# Patient Record
Sex: Male | Born: 1935 | Race: White | Hispanic: No | Marital: Married | Smoking: Former smoker
Health system: Southern US, Community
[De-identification: ages and names within clinical notes are randomized; demographics above are authoritative.]

## PROBLEM LIST (undated history)

## (undated) DIAGNOSIS — R319 Hematuria, unspecified: Secondary | ICD-10-CM

## (undated) DIAGNOSIS — I714 Abdominal aortic aneurysm, without rupture, unspecified: Secondary | ICD-10-CM

## (undated) DIAGNOSIS — E7800 Pure hypercholesterolemia, unspecified: Secondary | ICD-10-CM

## (undated) DIAGNOSIS — C259 Malignant neoplasm of pancreas, unspecified: Principal | ICD-10-CM

## (undated) DIAGNOSIS — R918 Other nonspecific abnormal finding of lung field: Secondary | ICD-10-CM

## (undated) DIAGNOSIS — Z95828 Presence of other vascular implants and grafts: Secondary | ICD-10-CM

## (undated) DIAGNOSIS — L0390 Cellulitis, unspecified: Secondary | ICD-10-CM

## (undated) DIAGNOSIS — N471 Phimosis: Secondary | ICD-10-CM

## (undated) DIAGNOSIS — R350 Frequency of micturition: Secondary | ICD-10-CM

## (undated) DIAGNOSIS — E119 Type 2 diabetes mellitus without complications: Secondary | ICD-10-CM

## (undated) DIAGNOSIS — D696 Thrombocytopenia, unspecified: Secondary | ICD-10-CM

## (undated) DIAGNOSIS — Z8551 Personal history of malignant neoplasm of bladder: Secondary | ICD-10-CM

## (undated) HISTORY — PX: COCHLEAR IMPLANT: SUR684

## (undated) HISTORY — PX: APPENDECTOMY: SHX54

## (undated) HISTORY — DX: Type 2 diabetes mellitus without complications: E11.9

## (undated) HISTORY — DX: Abdominal aortic aneurysm, without rupture: I71.4

## (undated) HISTORY — DX: Cellulitis, unspecified: L03.90

## (undated) HISTORY — DX: Personal history of malignant neoplasm of bladder: Z85.51

## (undated) HISTORY — PX: CHOLECYSTECTOMY: SHX55

## (undated) HISTORY — DX: Hematuria, unspecified: R31.9

## (undated) HISTORY — DX: Other nonspecific abnormal finding of lung field: R91.8

## (undated) HISTORY — DX: Thrombocytopenia, unspecified: D69.6

## (undated) HISTORY — DX: Frequency of micturition: R35.0

## (undated) HISTORY — DX: Presence of other vascular implants and grafts: Z95.828

## (undated) HISTORY — DX: Abdominal aortic aneurysm, without rupture, unspecified: I71.40

## (undated) HISTORY — DX: Phimosis: N47.1

## (undated) HISTORY — DX: Pure hypercholesterolemia, unspecified: E78.00

## (undated) HISTORY — DX: Malignant neoplasm of pancreas, unspecified: C25.9

## (undated) HISTORY — PX: ABDOMINAL AORTIC ANEURYSM REPAIR: SUR1152

---

## 2004-09-17 ENCOUNTER — Ambulatory Visit: Payer: Self-pay | Admitting: Internal Medicine

## 2004-10-08 ENCOUNTER — Ambulatory Visit: Payer: Self-pay | Admitting: Internal Medicine

## 2005-01-27 ENCOUNTER — Ambulatory Visit: Payer: Self-pay | Admitting: Internal Medicine

## 2005-02-05 ENCOUNTER — Ambulatory Visit: Payer: Self-pay | Admitting: Internal Medicine

## 2005-06-11 ENCOUNTER — Ambulatory Visit: Payer: Self-pay | Admitting: Internal Medicine

## 2005-07-08 ENCOUNTER — Ambulatory Visit: Payer: Self-pay | Admitting: Internal Medicine

## 2005-10-23 ENCOUNTER — Ambulatory Visit: Payer: Self-pay | Admitting: Internal Medicine

## 2005-11-07 ENCOUNTER — Ambulatory Visit: Payer: Self-pay | Admitting: Internal Medicine

## 2006-02-17 ENCOUNTER — Ambulatory Visit: Payer: Self-pay | Admitting: Internal Medicine

## 2006-03-08 ENCOUNTER — Ambulatory Visit: Payer: Self-pay | Admitting: Internal Medicine

## 2006-03-13 ENCOUNTER — Emergency Department: Payer: Self-pay | Admitting: Emergency Medicine

## 2006-07-01 ENCOUNTER — Ambulatory Visit: Payer: Self-pay | Admitting: Internal Medicine

## 2006-07-08 ENCOUNTER — Ambulatory Visit: Payer: Self-pay | Admitting: Internal Medicine

## 2006-11-18 ENCOUNTER — Ambulatory Visit: Payer: Self-pay | Admitting: Internal Medicine

## 2006-12-08 ENCOUNTER — Ambulatory Visit: Payer: Self-pay | Admitting: Internal Medicine

## 2007-03-24 ENCOUNTER — Ambulatory Visit: Payer: Self-pay | Admitting: Internal Medicine

## 2007-04-13 ENCOUNTER — Ambulatory Visit: Payer: Self-pay | Admitting: Oncology

## 2007-05-09 ENCOUNTER — Ambulatory Visit: Payer: Self-pay | Admitting: Oncology

## 2007-08-25 ENCOUNTER — Ambulatory Visit: Payer: Self-pay | Admitting: Internal Medicine

## 2007-09-08 ENCOUNTER — Ambulatory Visit: Payer: Self-pay | Admitting: Internal Medicine

## 2007-10-09 ENCOUNTER — Ambulatory Visit: Payer: Self-pay | Admitting: Internal Medicine

## 2007-12-09 ENCOUNTER — Ambulatory Visit: Payer: Self-pay | Admitting: Internal Medicine

## 2008-01-09 ENCOUNTER — Ambulatory Visit: Payer: Self-pay | Admitting: Internal Medicine

## 2008-01-11 ENCOUNTER — Ambulatory Visit: Payer: Self-pay | Admitting: Internal Medicine

## 2008-02-06 ENCOUNTER — Ambulatory Visit: Payer: Self-pay | Admitting: Internal Medicine

## 2008-05-08 ENCOUNTER — Ambulatory Visit: Payer: Self-pay | Admitting: Internal Medicine

## 2008-05-24 ENCOUNTER — Ambulatory Visit: Payer: Self-pay | Admitting: Internal Medicine

## 2008-06-07 ENCOUNTER — Ambulatory Visit: Payer: Self-pay | Admitting: Internal Medicine

## 2008-07-08 ENCOUNTER — Ambulatory Visit: Payer: Self-pay | Admitting: Internal Medicine

## 2008-09-07 ENCOUNTER — Ambulatory Visit: Payer: Self-pay | Admitting: Internal Medicine

## 2008-09-20 ENCOUNTER — Ambulatory Visit: Payer: Self-pay | Admitting: Internal Medicine

## 2008-10-08 ENCOUNTER — Ambulatory Visit: Payer: Self-pay | Admitting: Internal Medicine

## 2008-11-02 ENCOUNTER — Ambulatory Visit: Payer: Self-pay | Admitting: Family Medicine

## 2008-11-04 ENCOUNTER — Ambulatory Visit: Payer: Self-pay | Admitting: Internal Medicine

## 2008-11-11 ENCOUNTER — Ambulatory Visit: Payer: Self-pay | Admitting: Family Medicine

## 2009-03-08 ENCOUNTER — Ambulatory Visit: Payer: Self-pay | Admitting: Internal Medicine

## 2009-03-11 ENCOUNTER — Ambulatory Visit: Payer: Self-pay | Admitting: Internal Medicine

## 2009-03-14 ENCOUNTER — Ambulatory Visit: Payer: Self-pay | Admitting: Internal Medicine

## 2009-04-07 ENCOUNTER — Ambulatory Visit: Payer: Self-pay | Admitting: Internal Medicine

## 2009-08-08 ENCOUNTER — Ambulatory Visit: Payer: Self-pay | Admitting: Internal Medicine

## 2009-08-16 ENCOUNTER — Inpatient Hospital Stay: Payer: Self-pay | Admitting: Specialist

## 2009-08-20 ENCOUNTER — Ambulatory Visit: Payer: Self-pay | Admitting: Internal Medicine

## 2009-09-05 ENCOUNTER — Inpatient Hospital Stay: Payer: Self-pay | Admitting: Vascular Surgery

## 2009-09-07 ENCOUNTER — Ambulatory Visit: Payer: Self-pay | Admitting: Internal Medicine

## 2009-10-08 ENCOUNTER — Ambulatory Visit: Payer: Self-pay | Admitting: Internal Medicine

## 2009-11-07 ENCOUNTER — Ambulatory Visit: Payer: Self-pay | Admitting: Internal Medicine

## 2009-11-12 ENCOUNTER — Ambulatory Visit: Payer: Self-pay | Admitting: Vascular Surgery

## 2009-12-03 ENCOUNTER — Ambulatory Visit: Payer: Self-pay | Admitting: Vascular Surgery

## 2009-12-08 ENCOUNTER — Ambulatory Visit: Payer: Self-pay | Admitting: Internal Medicine

## 2009-12-10 ENCOUNTER — Inpatient Hospital Stay: Payer: Self-pay | Admitting: Vascular Surgery

## 2010-01-08 ENCOUNTER — Ambulatory Visit: Payer: Self-pay | Admitting: Internal Medicine

## 2010-02-05 ENCOUNTER — Ambulatory Visit: Payer: Self-pay | Admitting: Internal Medicine

## 2010-04-10 ENCOUNTER — Ambulatory Visit: Payer: Self-pay | Admitting: Internal Medicine

## 2010-05-08 ENCOUNTER — Ambulatory Visit: Payer: Self-pay | Admitting: Internal Medicine

## 2010-06-07 ENCOUNTER — Ambulatory Visit: Payer: Self-pay | Admitting: Internal Medicine

## 2010-07-06 ENCOUNTER — Ambulatory Visit: Payer: Self-pay | Admitting: Internal Medicine

## 2010-07-08 ENCOUNTER — Ambulatory Visit: Payer: Self-pay | Admitting: Internal Medicine

## 2010-07-13 ENCOUNTER — Ambulatory Visit: Payer: Self-pay | Admitting: Internal Medicine

## 2010-07-15 ENCOUNTER — Ambulatory Visit: Payer: Self-pay | Admitting: Internal Medicine

## 2010-07-16 ENCOUNTER — Ambulatory Visit: Payer: Self-pay | Admitting: Internal Medicine

## 2010-08-08 ENCOUNTER — Ambulatory Visit: Payer: Self-pay | Admitting: Internal Medicine

## 2010-09-07 ENCOUNTER — Ambulatory Visit: Payer: Self-pay | Admitting: Internal Medicine

## 2010-09-26 LAB — CEA: CEA: 5 ng/mL — ABNORMAL HIGH (ref 0.0–4.7)

## 2010-10-08 ENCOUNTER — Ambulatory Visit: Payer: Self-pay | Admitting: Internal Medicine

## 2010-11-12 ENCOUNTER — Ambulatory Visit: Payer: Self-pay | Admitting: Internal Medicine

## 2010-11-28 LAB — CEA: CEA: 4.9 ng/mL — ABNORMAL HIGH (ref 0.0–4.7)

## 2010-12-08 ENCOUNTER — Ambulatory Visit: Payer: Self-pay | Admitting: Internal Medicine

## 2010-12-21 ENCOUNTER — Ambulatory Visit: Payer: Self-pay | Admitting: Family Medicine

## 2011-01-08 ENCOUNTER — Ambulatory Visit: Payer: Self-pay | Admitting: Internal Medicine

## 2011-01-22 LAB — CEA: CEA: 4.5 ng/mL (ref 0.0–4.7)

## 2011-02-06 ENCOUNTER — Ambulatory Visit: Payer: Self-pay | Admitting: Internal Medicine

## 2011-02-19 LAB — CEA: CEA: 4.8 ng/mL — ABNORMAL HIGH (ref 0.0–4.7)

## 2011-03-09 ENCOUNTER — Ambulatory Visit: Payer: Self-pay | Admitting: Internal Medicine

## 2011-03-19 LAB — CEA: CEA: 5.9 ng/mL — ABNORMAL HIGH (ref 0.0–4.7)

## 2011-04-08 ENCOUNTER — Ambulatory Visit: Payer: Self-pay

## 2011-04-08 ENCOUNTER — Ambulatory Visit: Payer: Self-pay | Admitting: Internal Medicine

## 2011-05-09 ENCOUNTER — Ambulatory Visit: Payer: Self-pay | Admitting: Internal Medicine

## 2011-07-29 ENCOUNTER — Ambulatory Visit: Payer: Self-pay | Admitting: Internal Medicine

## 2011-08-09 ENCOUNTER — Ambulatory Visit: Payer: Self-pay | Admitting: Internal Medicine

## 2011-09-29 ENCOUNTER — Ambulatory Visit: Payer: Self-pay | Admitting: Internal Medicine

## 2011-10-09 ENCOUNTER — Ambulatory Visit: Payer: Self-pay | Admitting: Internal Medicine

## 2011-10-22 LAB — CEA: CEA: 6.4 ng/mL — ABNORMAL HIGH (ref 0.0–4.7)

## 2011-11-08 ENCOUNTER — Ambulatory Visit: Payer: Self-pay | Admitting: Internal Medicine

## 2011-12-16 ENCOUNTER — Ambulatory Visit: Payer: Self-pay | Admitting: Internal Medicine

## 2011-12-16 LAB — CREATININE, SERUM
Creatinine: 1.23 mg/dL (ref 0.60–1.30)
EGFR (African American): >60
EGFR (Non-African Amer.): >60

## 2011-12-16 LAB — CANCER CTR PLATELET CT: Platelet: 109 x10 3/mm — ABNORMAL LOW (ref 150–440)

## 2011-12-22 ENCOUNTER — Ambulatory Visit: Payer: Self-pay | Admitting: General Practice

## 2011-12-22 LAB — URINALYSIS, COMPLETE
Bacteria: NONE SEEN
Glucose,UR: NEGATIVE mg/dL (ref 0–75)
Leukocyte Esterase: NEGATIVE
Nitrite: NEGATIVE
Ph: 5 (ref 4.5–8.0)
RBC,UR: 4 /HPF (ref 0–5)
Specific Gravity: 1.014 (ref 1.003–1.030)
WBC UR: <1 /HPF (ref 0–5)

## 2011-12-22 LAB — BASIC METABOLIC PANEL
Anion Gap: 8 (ref 7–16)
BUN: 15 mg/dL (ref 7–18)
Chloride: 105 mmol/L (ref 98–107)
Co2: 30 mmol/L (ref 21–32)
Glucose: 143 mg/dL — ABNORMAL HIGH (ref 65–99)
Osmolality: 288 (ref 275–301)

## 2011-12-22 LAB — CBC
HCT: 46.7 % (ref 40.0–52.0)
MCH: 28.6 pg (ref 26.0–34.0)
MCHC: 32.7 g/dL (ref 32.0–36.0)
Platelet: 107 10*3/uL — ABNORMAL LOW (ref 150–440)
RDW: 14.1 % (ref 11.5–14.5)
WBC: 13.7 10*3/uL — ABNORMAL HIGH (ref 3.8–10.6)

## 2011-12-22 LAB — SEDIMENTATION RATE: Erythrocyte Sed Rate: 1 mm/hr (ref 0–20)

## 2012-01-09 ENCOUNTER — Ambulatory Visit: Payer: Self-pay | Admitting: Internal Medicine

## 2012-01-13 LAB — CBC CANCER CENTER
Basophil #: 0.1 x10 3/mm (ref 0.0–0.1)
Basophil %: 0.7 %
Eosinophil #: 0.1 x10 3/mm (ref 0.0–0.7)
Eosinophil %: 1 %
HGB: 15.3 g/dL (ref 13.0–18.0)
Lymphocyte #: 9.4 x10 3/mm — ABNORMAL HIGH (ref 1.0–3.6)
MCH: 28.7 pg (ref 26.0–34.0)
MCHC: 32.7 g/dL (ref 32.0–36.0)
MCV: 87.8 fL (ref 80–100)
Monocyte #: 0.7 x10 3/mm (ref 0.0–0.7)
Neutrophil #: 4.6 x10 3/mm (ref 1.4–6.5)
Neutrophil %: 30.7 %
RBC: 5.35 10*6/uL (ref 4.40–5.90)
RDW: 13.1 % (ref 11.5–14.5)

## 2012-01-14 LAB — CEA: CEA: 5 ng/mL — ABNORMAL HIGH (ref 0.0–4.7)

## 2012-01-20 LAB — CANCER CTR PLATELET CT: Platelet: 140 x10 3/mm — ABNORMAL LOW (ref 150–440)

## 2012-02-06 ENCOUNTER — Ambulatory Visit: Payer: Self-pay | Admitting: Internal Medicine

## 2012-02-17 LAB — CBC CANCER CENTER
Basophil #: 0.1 x10 3/mm (ref 0.0–0.1)
Basophil %: 0.5 %
Eosinophil %: 0.7 %
HCT: 45.5 % (ref 40.0–52.0)
HGB: 14.9 g/dL (ref 13.0–18.0)
Lymphocyte #: 8.5 x10 3/mm — ABNORMAL HIGH (ref 1.0–3.6)
Lymphocyte %: 67.2 %
MCV: 87.1 fL (ref 80–100)
Monocyte %: 4.6 %
Neutrophil #: 3.4 x10 3/mm (ref 1.4–6.5)
Neutrophil %: 27 %
RBC: 5.23 10*6/uL (ref 4.40–5.90)
WBC: 12.7 x10 3/mm — ABNORMAL HIGH (ref 3.8–10.6)

## 2012-03-08 ENCOUNTER — Ambulatory Visit: Payer: Self-pay | Admitting: Internal Medicine

## 2012-03-29 LAB — CREATININE, SERUM: EGFR (Non-African Amer.): >60

## 2012-04-07 ENCOUNTER — Ambulatory Visit: Payer: Self-pay | Admitting: Internal Medicine

## 2012-06-08 ENCOUNTER — Ambulatory Visit: Payer: Self-pay | Admitting: Oncology

## 2012-06-08 LAB — CBC CANCER CENTER
Eosinophil #: 0.1 x10 3/mm (ref 0.0–0.7)
Eosinophil %: 0.7 %
HCT: 45.5 % (ref 40.0–52.0)
HGB: 15.1 g/dL (ref 13.0–18.0)
Lymphocyte %: 66 %
MCHC: 33.2 g/dL (ref 32.0–36.0)
Monocyte #: 0.5 x10 3/mm (ref 0.2–1.0)
Monocyte %: 3.5 %
Neutrophil #: 4.3 x10 3/mm (ref 1.4–6.5)
Neutrophil %: 29.5 %
Platelet: 108 x10 3/mm — ABNORMAL LOW (ref 150–440)
RBC: 5.23 10*6/uL (ref 4.40–5.90)
WBC: 14.7 x10 3/mm — ABNORMAL HIGH (ref 3.8–10.6)

## 2012-06-08 LAB — CREATININE, SERUM
EGFR (African American): >60
EGFR (Non-African Amer.): >60

## 2012-07-08 ENCOUNTER — Ambulatory Visit: Payer: Self-pay | Admitting: Oncology

## 2012-07-13 LAB — CANCER CTR PLATELET CT: Platelet: 91 x10 3/mm — ABNORMAL LOW (ref 150–440)

## 2012-08-08 ENCOUNTER — Ambulatory Visit: Payer: Self-pay | Admitting: Oncology

## 2012-08-13 ENCOUNTER — Ambulatory Visit: Payer: Self-pay | Admitting: Oncology

## 2012-08-13 LAB — CREATININE, SERUM
Creatinine: 1.3 mg/dL (ref 0.60–1.30)
EGFR (African American): >60
EGFR (Non-African Amer.): 53 — ABNORMAL LOW

## 2012-08-13 LAB — CANCER CTR PLATELET CT: Platelet: 101 x10 3/mm — ABNORMAL LOW (ref 150–440)

## 2012-09-07 ENCOUNTER — Ambulatory Visit: Payer: Self-pay | Admitting: Oncology

## 2012-09-13 LAB — CBC CANCER CENTER
Basophil #: 0.1 x10 3/mm (ref 0.0–0.1)
Basophil %: 0.8 %
Eosinophil #: 0.1 x10 3/mm (ref 0.0–0.7)
HCT: 45.8 % (ref 40.0–52.0)
Lymphocyte #: 9.4 x10 3/mm — ABNORMAL HIGH (ref 1.0–3.6)
Lymphocyte %: 64.8 %
MCHC: 33.3 g/dL (ref 32.0–36.0)
MCV: 89 fL (ref 80–100)
Neutrophil #: 4.4 x10 3/mm (ref 1.4–6.5)
Platelet: 100 x10 3/mm — ABNORMAL LOW (ref 150–440)
RDW: 13.7 % (ref 11.5–14.5)
WBC: 14.5 x10 3/mm — ABNORMAL HIGH (ref 3.8–10.6)

## 2012-10-08 ENCOUNTER — Ambulatory Visit: Payer: Self-pay | Admitting: Internal Medicine

## 2012-10-08 ENCOUNTER — Ambulatory Visit: Payer: Self-pay | Admitting: Oncology

## 2012-11-02 LAB — CREATININE, SERUM
Creatinine: 1.29 mg/dL (ref 0.60–1.30)
EGFR (African American): >60

## 2012-11-07 ENCOUNTER — Ambulatory Visit: Payer: Self-pay | Admitting: Oncology

## 2012-12-29 ENCOUNTER — Ambulatory Visit: Payer: Self-pay | Admitting: Internal Medicine

## 2012-12-29 LAB — CBC CANCER CENTER
Basophil #: 0.1 x10 3/mm (ref 0.0–0.1)
Eosinophil %: 1 %
HCT: 45.3 % (ref 40.0–52.0)
HGB: 15.2 g/dL (ref 13.0–18.0)
MCHC: 33.7 g/dL (ref 32.0–36.0)
MCV: 86 fL (ref 80–100)
Neutrophil #: 3.9 x10 3/mm (ref 1.4–6.5)
Neutrophil %: 31 %
RBC: 5.27 10*6/uL (ref 4.40–5.90)
RDW: 14.3 % (ref 11.5–14.5)
WBC: 12.7 x10 3/mm — ABNORMAL HIGH (ref 3.8–10.6)

## 2012-12-29 LAB — CREATININE, SERUM
Creatinine: 1.27 mg/dL (ref 0.60–1.30)
EGFR (African American): >60
EGFR (Non-African Amer.): 54 — ABNORMAL LOW

## 2013-01-08 ENCOUNTER — Ambulatory Visit: Payer: Self-pay | Admitting: Internal Medicine

## 2013-03-08 ENCOUNTER — Ambulatory Visit: Payer: Self-pay | Admitting: Internal Medicine

## 2013-03-08 LAB — CBC CANCER CENTER
Basophil %: 0.5 %
HCT: 48.2 % (ref 40.0–52.0)
HGB: 15.9 g/dL (ref 13.0–18.0)
Lymphocyte #: 7.5 x10 3/mm — ABNORMAL HIGH (ref 1.0–3.6)
MCH: 28.4 pg (ref 26.0–34.0)
MCHC: 33 g/dL (ref 32.0–36.0)
MCV: 86 fL (ref 80–100)
Monocyte #: 0.5 x10 3/mm (ref 0.2–1.0)
Neutrophil #: 4.2 x10 3/mm (ref 1.4–6.5)
Neutrophil %: 34.2 %
Platelet: 105 x10 3/mm — ABNORMAL LOW (ref 150–440)
RDW: 13.8 % (ref 11.5–14.5)

## 2013-03-08 LAB — CREATININE, SERUM
Creatinine: 1.38 mg/dL — ABNORMAL HIGH (ref 0.60–1.30)
EGFR (African American): 57 — ABNORMAL LOW

## 2013-04-05 LAB — CANCER CTR PLATELET CT: Platelet: 104 x10 3/mm — ABNORMAL LOW (ref 150–440)

## 2013-04-06 LAB — CEA: CEA: 5.4 ng/mL — ABNORMAL HIGH (ref 0.0–4.7)

## 2013-04-07 ENCOUNTER — Ambulatory Visit: Payer: Self-pay | Admitting: Internal Medicine

## 2013-05-17 ENCOUNTER — Ambulatory Visit: Payer: Self-pay | Admitting: Internal Medicine

## 2013-05-17 LAB — CBC CANCER CENTER
Basophil #: 0.1 x10 3/mm (ref 0.0–0.1)
Basophil %: 0.8 %
Eosinophil %: 0.9 %
HCT: 44.3 % (ref 40.0–52.0)
HGB: 14.8 g/dL (ref 13.0–18.0)
Lymphocyte #: 8.4 x10 3/mm — ABNORMAL HIGH (ref 1.0–3.6)
Lymphocyte %: 66.5 %
MCV: 85 fL (ref 80–100)
Monocyte #: 0.3 x10 3/mm (ref 0.2–1.0)
Monocyte %: 2.4 %
Neutrophil #: 3.7 x10 3/mm (ref 1.4–6.5)
Platelet: 107 x10 3/mm — ABNORMAL LOW (ref 150–440)
RDW: 13.9 % (ref 11.5–14.5)

## 2013-06-07 ENCOUNTER — Ambulatory Visit: Payer: Self-pay | Admitting: Internal Medicine

## 2013-06-28 LAB — PROTIME-INR
INR: 1
Prothrombin Time: 13.7 secs (ref 11.5–14.7)

## 2013-07-08 ENCOUNTER — Ambulatory Visit: Payer: Self-pay | Admitting: Internal Medicine

## 2013-07-19 LAB — CANCER CTR PLATELET CT: Platelet: 124 x10 3/mm — ABNORMAL LOW (ref 150–440)

## 2013-08-08 ENCOUNTER — Ambulatory Visit: Payer: Self-pay | Admitting: Internal Medicine

## 2013-08-15 LAB — PLATELET COUNT: Platelet: 120 10*3/uL — ABNORMAL LOW (ref 150–440)

## 2013-09-07 ENCOUNTER — Ambulatory Visit: Payer: Self-pay | Admitting: Internal Medicine

## 2013-09-19 LAB — PROTIME-INR
INR: 1.4
Prothrombin Time: 17.4 secs — ABNORMAL HIGH (ref 11.5–14.7)

## 2013-09-21 LAB — PROTIME-INR
INR: 1.8
Prothrombin Time: 21 s — ABNORMAL HIGH

## 2013-10-07 LAB — PROTIME-INR
INR: 2
Prothrombin Time: 22.6 secs — ABNORMAL HIGH (ref 11.5–14.7)

## 2013-10-08 ENCOUNTER — Ambulatory Visit: Payer: Self-pay | Admitting: Internal Medicine

## 2013-10-13 LAB — PROTIME-INR
INR: 1.9
Prothrombin Time: 21.6 secs — ABNORMAL HIGH (ref 11.5–14.7)

## 2013-10-21 DIAGNOSIS — Z9889 Other specified postprocedural states: Secondary | ICD-10-CM | POA: Insufficient documentation

## 2013-10-21 DIAGNOSIS — D696 Thrombocytopenia, unspecified: Secondary | ICD-10-CM | POA: Insufficient documentation

## 2013-10-21 DIAGNOSIS — I82409 Acute embolism and thrombosis of unspecified deep veins of unspecified lower extremity: Secondary | ICD-10-CM | POA: Insufficient documentation

## 2013-10-21 DIAGNOSIS — IMO0002 Reserved for concepts with insufficient information to code with codable children: Secondary | ICD-10-CM | POA: Insufficient documentation

## 2013-10-22 ENCOUNTER — Ambulatory Visit: Payer: Self-pay | Admitting: Family Medicine

## 2013-10-22 LAB — URINALYSIS, COMPLETE
Glucose,UR: NEGATIVE mg/dL (ref 0–75)
Leukocyte Esterase: NEGATIVE
Nitrite: NEGATIVE
Ph: 6 (ref 4.5–8.0)
Protein: NEGATIVE
Specific Gravity: 1.025 (ref 1.003–1.030)
WBC UR: NONE SEEN /HPF (ref 0–5)

## 2013-10-22 LAB — DOT URINE DIP
Protein: NEGATIVE
Specific Gravity: 1.025 (ref 1.003–1.030)

## 2013-10-31 LAB — PROTIME-INR
INR: 2.5
Prothrombin Time: 26.1 secs — ABNORMAL HIGH (ref 11.5–14.7)

## 2013-11-07 ENCOUNTER — Ambulatory Visit: Payer: Self-pay | Admitting: Internal Medicine

## 2013-11-15 LAB — PROTIME-INR: INR: 2.2

## 2013-11-22 ENCOUNTER — Ambulatory Visit: Admit: 2013-11-22 | Disposition: A | Discharge status: Home / Self Care | Payer: Self-pay | Admitting: Internal Medicine

## 2013-11-29 ENCOUNTER — Ambulatory Visit: Payer: Self-pay | Admitting: Internal Medicine

## 2013-11-29 LAB — CBC CANCER CENTER
Basophil #: 0 x10 3/mm (ref 0.0–0.1)
Basophil %: 0.5 %
HCT: 45.6 % (ref 40.0–52.0)
Lymphocyte #: 6.2 x10 3/mm — ABNORMAL HIGH (ref 1.0–3.6)
Lymphocyte %: 58.8 %
MCV: 85 fL (ref 80–100)
Monocyte #: 0.6 x10 3/mm (ref 0.2–1.0)
Monocyte %: 5.2 %
Neutrophil #: 3.7 x10 3/mm (ref 1.4–6.5)
Neutrophil %: 34.5 %
Platelet: 84 x10 3/mm — ABNORMAL LOW (ref 150–440)

## 2013-11-29 LAB — PROTIME-INR
INR: 2.5
Prothrombin Time: 26.4 secs — ABNORMAL HIGH (ref 11.5–14.7)

## 2013-12-08 ENCOUNTER — Ambulatory Visit: Payer: Self-pay | Admitting: Internal Medicine

## 2013-12-08 ENCOUNTER — Ambulatory Visit: Payer: Self-pay

## 2013-12-27 LAB — CBC CANCER CENTER
Basophil #: 0.1 x10 3/mm (ref 0.0–0.1)
Basophil %: 0.6 %
EOS ABS: 0.1 x10 3/mm (ref 0.0–0.7)
EOS PCT: 0.6 %
HCT: 47.3 % (ref 40.0–52.0)
HGB: 15.6 g/dL (ref 13.0–18.0)
Lymphocyte #: 6.8 x10 3/mm — ABNORMAL HIGH (ref 1.0–3.6)
Lymphocyte %: 61.1 %
MCH: 28.1 pg (ref 26.0–34.0)
MCHC: 33 g/dL (ref 32.0–36.0)
MCV: 85 fL (ref 80–100)
MONO ABS: 0.4 x10 3/mm (ref 0.2–1.0)
Monocyte %: 3.4 %
NEUTROS PCT: 34.3 %
Neutrophil #: 3.8 x10 3/mm (ref 1.4–6.5)
PLATELETS: 88 x10 3/mm — AB (ref 150–440)
RBC: 5.54 10*6/uL (ref 4.40–5.90)
RDW: 14.5 % (ref 11.5–14.5)
WBC: 11.1 x10 3/mm — ABNORMAL HIGH (ref 3.8–10.6)

## 2013-12-27 LAB — PROTIME-INR
INR: 2.6
Prothrombin Time: 27.4 secs — ABNORMAL HIGH (ref 11.5–14.7)

## 2014-01-08 ENCOUNTER — Ambulatory Visit: Payer: Self-pay | Admitting: Internal Medicine

## 2014-01-31 LAB — CBC CANCER CENTER
Basophil #: 0.1 x10 3/mm (ref 0.0–0.1)
Basophil %: 0.4 %
EOS PCT: 1 %
Eosinophil #: 0.1 x10 3/mm (ref 0.0–0.7)
HCT: 49.3 % (ref 40.0–52.0)
HGB: 16.1 g/dL (ref 13.0–18.0)
Lymphocyte #: 7.2 x10 3/mm — ABNORMAL HIGH (ref 1.0–3.6)
Lymphocyte %: 60 %
MCH: 28 pg (ref 26.0–34.0)
MCHC: 32.7 g/dL (ref 32.0–36.0)
MCV: 86 fL (ref 80–100)
MONO ABS: 0.5 x10 3/mm (ref 0.2–1.0)
Monocyte %: 4.1 %
NEUTROS PCT: 34.5 %
Neutrophil #: 4.1 x10 3/mm (ref 1.4–6.5)
Platelet: 88 x10 3/mm — ABNORMAL LOW (ref 150–440)
RBC: 5.75 10*6/uL (ref 4.40–5.90)
RDW: 14.1 % (ref 11.5–14.5)
WBC: 11.9 x10 3/mm — AB (ref 3.8–10.6)

## 2014-01-31 LAB — PROTIME-INR
INR: 2.4
Prothrombin Time: 25.4 secs — ABNORMAL HIGH (ref 11.5–14.7)

## 2014-02-05 ENCOUNTER — Ambulatory Visit: Payer: Self-pay | Admitting: Internal Medicine

## 2014-03-08 ENCOUNTER — Ambulatory Visit: Payer: Self-pay | Admitting: Internal Medicine

## 2014-03-21 LAB — CBC CANCER CENTER
Basophil #: 0 x10 3/mm (ref 0.0–0.1)
Basophil %: 0.4 %
EOS ABS: 0.1 x10 3/mm (ref 0.0–0.7)
EOS PCT: 0.9 %
HCT: 46.4 % (ref 40.0–52.0)
HGB: 15.4 g/dL (ref 13.0–18.0)
Lymphocyte #: 7.6 x10 3/mm — ABNORMAL HIGH (ref 1.0–3.6)
Lymphocyte %: 58.4 %
MCH: 28 pg (ref 26.0–34.0)
MCHC: 33.2 g/dL (ref 32.0–36.0)
MCV: 84 fL (ref 80–100)
Monocyte #: 0.5 x10 3/mm (ref 0.2–1.0)
Monocyte %: 3.7 %
NEUTROS PCT: 36.6 %
Neutrophil #: 4.7 x10 3/mm (ref 1.4–6.5)
PLATELETS: 98 x10 3/mm — AB (ref 150–440)
RBC: 5.49 10*6/uL (ref 4.40–5.90)
RDW: 14.9 % — AB (ref 11.5–14.5)
WBC: 13 x10 3/mm — AB (ref 3.8–10.6)

## 2014-03-21 LAB — PROTIME-INR
INR: 2.1
PROTHROMBIN TIME: 23.4 s — AB (ref 11.5–14.7)

## 2014-04-07 ENCOUNTER — Ambulatory Visit: Payer: Self-pay | Admitting: Internal Medicine

## 2014-04-25 LAB — CBC CANCER CENTER
BASOS ABS: 0.1 x10 3/mm (ref 0.0–0.1)
Basophil %: 0.4 %
Eosinophil #: 0.1 x10 3/mm (ref 0.0–0.7)
Eosinophil %: 0.9 %
HCT: 48.2 % (ref 40.0–52.0)
HGB: 15.9 g/dL (ref 13.0–18.0)
LYMPHS ABS: 8 x10 3/mm — AB (ref 1.0–3.6)
Lymphocyte %: 61.6 %
MCH: 28.4 pg (ref 26.0–34.0)
MCHC: 32.9 g/dL (ref 32.0–36.0)
MCV: 86 fL (ref 80–100)
Monocyte #: 0.5 x10 3/mm (ref 0.2–1.0)
Monocyte %: 3.6 %
Neutrophil #: 4.3 x10 3/mm (ref 1.4–6.5)
Neutrophil %: 33.5 %
Platelet: 91 x10 3/mm — ABNORMAL LOW (ref 150–440)
RBC: 5.6 10*6/uL (ref 4.40–5.90)
RDW: 14.3 % (ref 11.5–14.5)
WBC: 13 x10 3/mm — AB (ref 3.8–10.6)

## 2014-04-25 LAB — PROTIME-INR
INR: 1.8
PROTHROMBIN TIME: 20.1 s — AB (ref 11.5–14.7)

## 2014-05-03 LAB — CBC CANCER CENTER
Basophil #: 0.1 x10 3/mm (ref 0.0–0.1)
Basophil %: 0.5 %
EOS PCT: 0.7 %
Eosinophil #: 0.1 x10 3/mm (ref 0.0–0.7)
HCT: 48.4 % (ref 40.0–52.0)
HGB: 16.1 g/dL (ref 13.0–18.0)
Lymphocyte #: 8.5 x10 3/mm — ABNORMAL HIGH (ref 1.0–3.6)
Lymphocyte %: 59.1 %
MCH: 28.2 pg (ref 26.0–34.0)
MCHC: 33.2 g/dL (ref 32.0–36.0)
MCV: 85 fL (ref 80–100)
Monocyte #: 0.5 x10 3/mm (ref 0.2–1.0)
Monocyte %: 3.7 %
NEUTROS ABS: 5.2 x10 3/mm (ref 1.4–6.5)
Neutrophil %: 36 %
Platelet: 101 x10 3/mm — ABNORMAL LOW (ref 150–440)
RBC: 5.69 10*6/uL (ref 4.40–5.90)
RDW: 14.2 % (ref 11.5–14.5)
WBC: 14.4 x10 3/mm — ABNORMAL HIGH (ref 3.8–10.6)

## 2014-05-03 LAB — PROTIME-INR
INR: 1.8
PROTHROMBIN TIME: 20.1 s — AB (ref 11.5–14.7)

## 2014-05-04 LAB — URINALYSIS, COMPLETE
BACTERIA: NEGATIVE
Bilirubin,UR: NEGATIVE
KETONE: NEGATIVE
LEUKOCYTE ESTERASE: NEGATIVE
Nitrite: NEGATIVE
Ph: 6.5 (ref 4.5–8.0)
Specific Gravity: 1.01 (ref 1.003–1.030)

## 2014-05-06 LAB — URINE CULTURE

## 2014-05-08 ENCOUNTER — Ambulatory Visit: Payer: Self-pay | Admitting: Internal Medicine

## 2014-05-08 DIAGNOSIS — D494 Neoplasm of unspecified behavior of bladder: Secondary | ICD-10-CM | POA: Insufficient documentation

## 2014-05-14 DIAGNOSIS — H9193 Unspecified hearing loss, bilateral: Secondary | ICD-10-CM | POA: Insufficient documentation

## 2014-05-14 DIAGNOSIS — C9110 Chronic lymphocytic leukemia of B-cell type not having achieved remission: Secondary | ICD-10-CM | POA: Insufficient documentation

## 2014-05-15 ENCOUNTER — Ambulatory Visit: Payer: Self-pay | Admitting: Urology

## 2014-05-17 ENCOUNTER — Ambulatory Visit: Payer: Self-pay | Admitting: Urology

## 2014-05-19 LAB — PATHOLOGY REPORT

## 2014-05-23 LAB — CBC CANCER CENTER
Basophil #: 0.2 x10 3/mm — ABNORMAL HIGH (ref 0.0–0.1)
Basophil %: 1.1 %
Eosinophil #: 0.1 x10 3/mm (ref 0.0–0.7)
Eosinophil %: 0.6 %
HCT: 44.4 % (ref 40.0–52.0)
HGB: 14.5 g/dL (ref 13.0–18.0)
Lymphocyte #: 7.4 x10 3/mm — ABNORMAL HIGH (ref 1.0–3.6)
Lymphocyte %: 53.1 %
MCH: 28.3 pg (ref 26.0–34.0)
MCHC: 32.6 g/dL (ref 32.0–36.0)
MCV: 87 fL (ref 80–100)
Monocyte #: 0.5 x10 3/mm (ref 0.2–1.0)
Monocyte %: 3.6 %
NEUTROS ABS: 5.8 x10 3/mm (ref 1.4–6.5)
Neutrophil %: 41.6 %
PLATELETS: 89 x10 3/mm — AB (ref 150–440)
RBC: 5.12 10*6/uL (ref 4.40–5.90)
RDW: 14.8 % — AB (ref 11.5–14.5)
WBC: 13.9 x10 3/mm — ABNORMAL HIGH (ref 3.8–10.6)

## 2014-05-23 LAB — PROTIME-INR
INR: 1
Prothrombin Time: 13 secs (ref 11.5–14.7)

## 2014-05-23 LAB — CREATININE, SERUM
CREATININE: 1.04 mg/dL (ref 0.60–1.30)
EGFR (African American): >60
EGFR (Non-African Amer.): >60

## 2014-05-29 LAB — CBC CANCER CENTER
Basophil #: 0.1 x10 3/mm (ref 0.0–0.1)
Basophil %: 0.4 %
EOS PCT: 0.6 %
Eosinophil #: 0.1 x10 3/mm (ref 0.0–0.7)
HCT: 43.8 % (ref 40.0–52.0)
HGB: 14.5 g/dL (ref 13.0–18.0)
Lymphocyte #: 6.9 x10 3/mm — ABNORMAL HIGH (ref 1.0–3.6)
Lymphocyte %: 56.6 %
MCH: 28.6 pg (ref 26.0–34.0)
MCHC: 33.2 g/dL (ref 32.0–36.0)
MCV: 86 fL (ref 80–100)
MONOS PCT: 3.1 %
Monocyte #: 0.4 x10 3/mm (ref 0.2–1.0)
Neutrophil #: 4.8 x10 3/mm (ref 1.4–6.5)
Neutrophil %: 39.3 %
PLATELETS: 93 x10 3/mm — AB (ref 150–440)
RBC: 5.08 10*6/uL (ref 4.40–5.90)
RDW: 14.7 % — AB (ref 11.5–14.5)
WBC: 12.3 x10 3/mm — ABNORMAL HIGH (ref 3.8–10.6)

## 2014-05-29 LAB — PROTIME-INR
INR: 1
Prothrombin Time: 13.2 secs (ref 11.5–14.7)

## 2014-06-07 ENCOUNTER — Ambulatory Visit: Payer: Self-pay | Admitting: Internal Medicine

## 2014-06-08 LAB — PROTIME-INR
INR: 1.6
Prothrombin Time: 18.4 secs — ABNORMAL HIGH (ref 11.5–14.7)

## 2014-06-13 LAB — PROTIME-INR
INR: 1.8
Prothrombin Time: 20.3 secs — ABNORMAL HIGH (ref 11.5–14.7)

## 2014-06-19 LAB — CBC CANCER CENTER
BASOS ABS: 0.1 x10 3/mm (ref 0.0–0.1)
Basophil %: 0.4 %
EOS ABS: 0.1 x10 3/mm (ref 0.0–0.7)
EOS PCT: 0.7 %
HCT: 42.2 % (ref 40.0–52.0)
HGB: 13.9 g/dL (ref 13.0–18.0)
LYMPHS ABS: 8.2 x10 3/mm — AB (ref 1.0–3.6)
Lymphocyte %: 54.3 %
MCH: 28.2 pg (ref 26.0–34.0)
MCHC: 33.1 g/dL (ref 32.0–36.0)
MCV: 85 fL (ref 80–100)
MONO ABS: 0.5 x10 3/mm (ref 0.2–1.0)
Monocyte %: 3.2 %
NEUTROS PCT: 41.4 %
Neutrophil #: 6.2 x10 3/mm (ref 1.4–6.5)
PLATELETS: 107 x10 3/mm — AB (ref 150–440)
RBC: 4.94 10*6/uL (ref 4.40–5.90)
RDW: 14.7 % — ABNORMAL HIGH (ref 11.5–14.5)
WBC: 15 x10 3/mm — ABNORMAL HIGH (ref 3.8–10.6)

## 2014-06-19 LAB — PROTIME-INR
INR: 1.8
Prothrombin Time: 20.6 secs — ABNORMAL HIGH (ref 11.5–14.7)

## 2014-06-23 LAB — PROTIME-INR
INR: 1.7
PROTHROMBIN TIME: 19.4 s — AB (ref 11.5–14.7)

## 2014-07-08 ENCOUNTER — Ambulatory Visit: Payer: Self-pay | Admitting: Internal Medicine

## 2014-07-11 ENCOUNTER — Ambulatory Visit: Payer: Self-pay | Admitting: Internal Medicine

## 2014-07-11 LAB — PROTIME-INR
INR: 1.7
Prothrombin Time: 19.5 secs — ABNORMAL HIGH (ref 11.5–14.7)

## 2014-07-18 LAB — PROTIME-INR
INR: 1.8
Prothrombin Time: 20.3 secs — ABNORMAL HIGH (ref 11.5–14.7)

## 2014-07-25 LAB — CBC CANCER CENTER
BASOS ABS: 0.1 x10 3/mm (ref 0.0–0.1)
BASOS PCT: 0.5 %
EOS ABS: 0.2 x10 3/mm (ref 0.0–0.7)
EOS PCT: 1.2 %
HCT: 45.6 % (ref 40.0–52.0)
HGB: 15.1 g/dL (ref 13.0–18.0)
LYMPHS ABS: 8.5 x10 3/mm — AB (ref 1.0–3.6)
Lymphocyte %: 63.6 %
MCH: 28.8 pg (ref 26.0–34.0)
MCHC: 33.1 g/dL (ref 32.0–36.0)
MCV: 87 fL (ref 80–100)
MONO ABS: 0.4 x10 3/mm (ref 0.2–1.0)
Monocyte %: 3.4 %
NEUTROS ABS: 4.2 x10 3/mm (ref 1.4–6.5)
NEUTROS PCT: 31.3 %
Platelet: 97 x10 3/mm — ABNORMAL LOW (ref 150–440)
RBC: 5.25 10*6/uL (ref 4.40–5.90)
RDW: 15.3 % — ABNORMAL HIGH (ref 11.5–14.5)
WBC: 13.4 x10 3/mm — ABNORMAL HIGH (ref 3.8–10.6)

## 2014-07-25 LAB — PROTIME-INR
INR: 1.9
Prothrombin Time: 21.2 secs — ABNORMAL HIGH (ref 11.5–14.7)

## 2014-07-26 LAB — CEA: CEA: 7.1 ng/mL — ABNORMAL HIGH (ref 0.0–4.7)

## 2014-08-07 LAB — PROTIME-INR
INR: 1.8
PROTHROMBIN TIME: 20.9 s — AB (ref 11.5–14.7)

## 2014-08-08 ENCOUNTER — Ambulatory Visit: Payer: Self-pay | Admitting: Internal Medicine

## 2014-08-08 LAB — CEA: CEA: 7 ng/mL — ABNORMAL HIGH (ref 0.0–4.7)

## 2014-08-29 LAB — CBC CANCER CENTER
Basophil #: 0 x10 3/mm (ref 0.0–0.1)
Basophil %: 0.2 %
EOS ABS: 0.2 x10 3/mm (ref 0.0–0.7)
Eosinophil %: 1.4 %
HCT: 45.8 % (ref 40.0–52.0)
HGB: 14.9 g/dL (ref 13.0–18.0)
LYMPHS ABS: 7.3 x10 3/mm — AB (ref 1.0–3.6)
Lymphocyte %: 62 %
MCH: 28.4 pg (ref 26.0–34.0)
MCHC: 32.6 g/dL (ref 32.0–36.0)
MCV: 87 fL (ref 80–100)
Monocyte #: 0.4 x10 3/mm (ref 0.2–1.0)
Monocyte %: 3 %
NEUTROS PCT: 33.4 %
Neutrophil #: 4 x10 3/mm (ref 1.4–6.5)
PLATELETS: 79 x10 3/mm — AB (ref 150–440)
RBC: 5.26 10*6/uL (ref 4.40–5.90)
RDW: 14.7 % — ABNORMAL HIGH (ref 11.5–14.5)
WBC: 11.8 x10 3/mm — AB (ref 3.8–10.6)

## 2014-08-29 LAB — PROTIME-INR
INR: 2
PROTHROMBIN TIME: 22.2 s — AB (ref 11.5–14.7)

## 2014-09-07 ENCOUNTER — Ambulatory Visit: Payer: Self-pay | Admitting: Internal Medicine

## 2014-09-13 LAB — CEA: CEA: 6.2 ng/mL — ABNORMAL HIGH (ref 0.0–4.7)

## 2014-09-26 LAB — CBC CANCER CENTER
BASOS ABS: 0.1 x10 3/mm (ref 0.0–0.1)
Basophil %: 0.5 %
EOS PCT: 0.8 %
Eosinophil #: 0.1 x10 3/mm (ref 0.0–0.7)
HCT: 45.3 % (ref 40.0–52.0)
HGB: 15.3 g/dL (ref 13.0–18.0)
LYMPHS PCT: 60.9 %
Lymphocyte #: 9.3 x10 3/mm — ABNORMAL HIGH (ref 1.0–3.6)
MCH: 29.2 pg (ref 26.0–34.0)
MCHC: 33.9 g/dL (ref 32.0–36.0)
MCV: 86 fL (ref 80–100)
Monocyte #: 0.4 x10 3/mm (ref 0.2–1.0)
Monocyte %: 2.8 %
NEUTROS PCT: 35 %
Neutrophil #: 5.3 x10 3/mm (ref 1.4–6.5)
PLATELETS: 93 x10 3/mm — AB (ref 150–440)
RBC: 5.26 10*6/uL (ref 4.40–5.90)
RDW: 14.3 % (ref 11.5–14.5)
WBC: 15.2 x10 3/mm — ABNORMAL HIGH (ref 3.8–10.6)

## 2014-09-26 LAB — PROTIME-INR
INR: 2
Prothrombin Time: 22.1 secs — ABNORMAL HIGH (ref 11.5–14.7)

## 2014-09-29 ENCOUNTER — Ambulatory Visit: Payer: Self-pay | Admitting: Family Medicine

## 2014-09-29 LAB — DOT URINE DIP
Glucose,UR: NEGATIVE
PROTEIN: NEGATIVE
Specific Gravity: 1.025 (ref 1.000–1.030)

## 2014-10-08 ENCOUNTER — Ambulatory Visit: Payer: Self-pay | Admitting: Internal Medicine

## 2014-10-24 LAB — CBC CANCER CENTER
Basophil #: 0.1 x10 3/mm (ref 0.0–0.1)
Basophil %: 0.6 %
EOS PCT: 0.8 %
Eosinophil #: 0.1 x10 3/mm (ref 0.0–0.7)
HCT: 46.4 % (ref 40.0–52.0)
HGB: 14.9 g/dL (ref 13.0–18.0)
Lymphocyte #: 8.6 x10 3/mm — ABNORMAL HIGH (ref 1.0–3.6)
Lymphocyte %: 62.4 %
MCH: 27.8 pg (ref 26.0–34.0)
MCHC: 32.2 g/dL (ref 32.0–36.0)
MCV: 86 fL (ref 80–100)
MONO ABS: 0.5 x10 3/mm (ref 0.2–1.0)
Monocyte %: 3.3 %
NEUTROS ABS: 4.5 x10 3/mm (ref 1.4–6.5)
NEUTROS PCT: 32.9 %
Platelet: 86 x10 3/mm — ABNORMAL LOW (ref 150–440)
RBC: 5.38 10*6/uL (ref 4.40–5.90)
RDW: 14.1 % (ref 11.5–14.5)
WBC: 13.7 x10 3/mm — AB (ref 3.8–10.6)

## 2014-10-24 LAB — PROTIME-INR
INR: 1.8
Prothrombin Time: 20.5 secs — ABNORMAL HIGH (ref 11.5–14.7)

## 2014-10-25 LAB — CEA: CEA: 6.5 ng/mL — AB (ref 0.0–4.7)

## 2014-11-07 ENCOUNTER — Ambulatory Visit: Payer: Self-pay | Admitting: Internal Medicine

## 2014-11-22 ENCOUNTER — Ambulatory Visit: Payer: Self-pay | Admitting: Ophthalmology

## 2014-12-15 ENCOUNTER — Other Ambulatory Visit (HOSPITAL_COMMUNITY): Payer: Self-pay | Admitting: Internal Medicine

## 2014-12-25 ENCOUNTER — Ambulatory Visit: Payer: Self-pay

## 2014-12-25 DIAGNOSIS — S0992XA Unspecified injury of nose, initial encounter: Secondary | ICD-10-CM | POA: Diagnosis not present

## 2014-12-25 DIAGNOSIS — E1165 Type 2 diabetes mellitus with hyperglycemia: Secondary | ICD-10-CM | POA: Diagnosis not present

## 2014-12-25 DIAGNOSIS — J3489 Other specified disorders of nose and nasal sinuses: Secondary | ICD-10-CM | POA: Diagnosis not present

## 2014-12-25 DIAGNOSIS — H2511 Age-related nuclear cataract, right eye: Secondary | ICD-10-CM | POA: Diagnosis not present

## 2014-12-25 DIAGNOSIS — R739 Hyperglycemia, unspecified: Secondary | ICD-10-CM | POA: Diagnosis not present

## 2014-12-25 DIAGNOSIS — Z79899 Other long term (current) drug therapy: Secondary | ICD-10-CM | POA: Diagnosis not present

## 2014-12-25 LAB — PROTIME-INR
INR: 1.6
PROTHROMBIN TIME: 18.9 s — AB (ref 11.5–14.7)

## 2014-12-28 ENCOUNTER — Ambulatory Visit: Payer: Self-pay | Admitting: Internal Medicine

## 2014-12-28 DIAGNOSIS — Z86718 Personal history of other venous thrombosis and embolism: Secondary | ICD-10-CM | POA: Diagnosis not present

## 2014-12-28 DIAGNOSIS — C9110 Chronic lymphocytic leukemia of B-cell type not having achieved remission: Secondary | ICD-10-CM | POA: Diagnosis not present

## 2014-12-28 LAB — PROTIME-INR
INR: 1.6
Prothrombin Time: 18.4 secs — ABNORMAL HIGH (ref 11.5–14.7)

## 2015-01-01 ENCOUNTER — Ambulatory Visit: Payer: Self-pay | Admitting: Ophthalmology

## 2015-01-01 DIAGNOSIS — C61 Malignant neoplasm of prostate: Secondary | ICD-10-CM | POA: Diagnosis not present

## 2015-01-01 DIAGNOSIS — I4891 Unspecified atrial fibrillation: Secondary | ICD-10-CM | POA: Diagnosis not present

## 2015-01-01 DIAGNOSIS — C9140 Hairy cell leukemia not having achieved remission: Secondary | ICD-10-CM | POA: Diagnosis not present

## 2015-01-01 DIAGNOSIS — K219 Gastro-esophageal reflux disease without esophagitis: Secondary | ICD-10-CM | POA: Diagnosis not present

## 2015-01-01 DIAGNOSIS — H2511 Age-related nuclear cataract, right eye: Secondary | ICD-10-CM | POA: Diagnosis not present

## 2015-01-01 DIAGNOSIS — E119 Type 2 diabetes mellitus without complications: Secondary | ICD-10-CM | POA: Diagnosis not present

## 2015-01-01 DIAGNOSIS — Z888 Allergy status to other drugs, medicaments and biological substances status: Secondary | ICD-10-CM | POA: Diagnosis not present

## 2015-01-01 DIAGNOSIS — K449 Diaphragmatic hernia without obstruction or gangrene: Secondary | ICD-10-CM | POA: Diagnosis not present

## 2015-01-08 ENCOUNTER — Ambulatory Visit: Payer: Self-pay | Admitting: Internal Medicine

## 2015-02-20 ENCOUNTER — Ambulatory Visit
Admit: 2015-02-20 | Disposition: A | Discharge status: Home / Self Care | Payer: Self-pay | Attending: Internal Medicine | Admitting: Internal Medicine

## 2015-02-20 DIAGNOSIS — Z872 Personal history of diseases of the skin and subcutaneous tissue: Secondary | ICD-10-CM | POA: Diagnosis not present

## 2015-02-20 DIAGNOSIS — Z79899 Other long term (current) drug therapy: Secondary | ICD-10-CM | POA: Diagnosis not present

## 2015-02-20 DIAGNOSIS — C9110 Chronic lymphocytic leukemia of B-cell type not having achieved remission: Secondary | ICD-10-CM | POA: Diagnosis not present

## 2015-02-20 DIAGNOSIS — I714 Abdominal aortic aneurysm, without rupture: Secondary | ICD-10-CM | POA: Diagnosis not present

## 2015-02-20 DIAGNOSIS — K219 Gastro-esophageal reflux disease without esophagitis: Secondary | ICD-10-CM | POA: Diagnosis not present

## 2015-02-20 DIAGNOSIS — E780 Pure hypercholesterolemia: Secondary | ICD-10-CM | POA: Diagnosis not present

## 2015-02-20 DIAGNOSIS — R634 Abnormal weight loss: Secondary | ICD-10-CM | POA: Diagnosis not present

## 2015-02-20 DIAGNOSIS — D696 Thrombocytopenia, unspecified: Secondary | ICD-10-CM | POA: Diagnosis not present

## 2015-02-20 DIAGNOSIS — R970 Elevated carcinoembryonic antigen [CEA]: Secondary | ICD-10-CM | POA: Diagnosis not present

## 2015-03-05 DIAGNOSIS — R634 Abnormal weight loss: Secondary | ICD-10-CM | POA: Diagnosis not present

## 2015-03-05 DIAGNOSIS — R970 Elevated carcinoembryonic antigen [CEA]: Secondary | ICD-10-CM | POA: Diagnosis not present

## 2015-03-05 DIAGNOSIS — Z79899 Other long term (current) drug therapy: Secondary | ICD-10-CM | POA: Diagnosis not present

## 2015-03-05 DIAGNOSIS — E780 Pure hypercholesterolemia: Secondary | ICD-10-CM | POA: Diagnosis not present

## 2015-03-05 DIAGNOSIS — C9110 Chronic lymphocytic leukemia of B-cell type not having achieved remission: Secondary | ICD-10-CM | POA: Diagnosis not present

## 2015-03-05 DIAGNOSIS — D696 Thrombocytopenia, unspecified: Secondary | ICD-10-CM | POA: Diagnosis not present

## 2015-03-05 DIAGNOSIS — Z872 Personal history of diseases of the skin and subcutaneous tissue: Secondary | ICD-10-CM | POA: Diagnosis not present

## 2015-03-05 DIAGNOSIS — I714 Abdominal aortic aneurysm, without rupture: Secondary | ICD-10-CM | POA: Diagnosis not present

## 2015-03-05 DIAGNOSIS — K219 Gastro-esophageal reflux disease without esophagitis: Secondary | ICD-10-CM | POA: Diagnosis not present

## 2015-03-05 LAB — PROTIME-INR
INR: 1.3
PROTHROMBIN TIME: 16.5 s — AB

## 2015-03-05 LAB — PLATELET COUNT: Platelet: 83 10*3/uL — ABNORMAL LOW (ref 150–440)

## 2015-03-09 ENCOUNTER — Ambulatory Visit
Admit: 2015-03-09 | Disposition: A | Discharge status: Home / Self Care | Payer: Self-pay | Attending: Internal Medicine | Admitting: Internal Medicine

## 2015-03-09 DIAGNOSIS — Z961 Presence of intraocular lens: Secondary | ICD-10-CM | POA: Diagnosis not present

## 2015-03-15 DIAGNOSIS — D696 Thrombocytopenia, unspecified: Secondary | ICD-10-CM | POA: Diagnosis not present

## 2015-03-15 LAB — PROTIME-INR
INR: 1.6
Prothrombin Time: 19.2 secs — ABNORMAL HIGH

## 2015-03-26 DIAGNOSIS — R739 Hyperglycemia, unspecified: Secondary | ICD-10-CM | POA: Diagnosis not present

## 2015-03-26 DIAGNOSIS — E1165 Type 2 diabetes mellitus with hyperglycemia: Secondary | ICD-10-CM | POA: Diagnosis not present

## 2015-03-31 NOTE — Op Note (Signed)
PATIENT NAME:  Kenneth Curry, VORA MR#:  [id number] DATE OF BIRTH:  [birth year]  DATE OF PROCEDURE:  05/17/2014  PREOPERATIVE DIAGNOSIS: Medium sized bladder tumor, superficial.   POSTOPERATIVE DIAGNOSIS: Medium sized bladder tumor, superficial.   PROCEDURE: Transurethral resection of bladder tumor, medium-sized.   COMPLICATIONS: None.   BLOOD LOSS: 0 mL.   SURGEON: Rick Duff, MD   ANESTHESIA: General.   DESCRIPTION OF PROCEDURE: With the patient sterilely prepped and draped and after an appropriate timeout with a Circon resectoscope and proper grounding of the patient, I entered the bladder with the obturator in place and then placed the working elements within the Circon 26-French resectoscope. I resected the tumor without difficulty. It was on the left wall near the ureter. I do not see the ureteral orifice, but I do not believe I shut it off, so the patient has this resection done and the tumor is sent to pathology after it is evacuated out with irrigation. There is no bleeding at the end of the case, but I do put a 24-French two-way Foley in place. I  put a B and O suppository in the rectum and 30 mL of 0.5% Marcaine in the bladder. The patient's drainage is nice and clear. There were no other tumors in the bladder on inspection. Ureters appeared to be in the normal position, although this tumor covered the left ureter area and it was hard to see the ureter at the end of the procedure for cautery effect, but he tolerated it well and was sent to recovery in satisfactory condition. He will be seen in followup for Foley removal in the morning.    ____________________________ Janice Coffin. Elnoria Howard, DO rdh:aw D: 05/17/2014 13:07:00 ET T: 05/17/2014 13:27:40 ET JOB#: [id number]  cc: Janice Coffin. Elnoria Howard, DO, <Dictator> RICHARD D HART DO ELECTRONICALLY SIGNED 05/19/2014 15:42

## 2015-04-16 DIAGNOSIS — R739 Hyperglycemia, unspecified: Secondary | ICD-10-CM | POA: Diagnosis not present

## 2015-04-16 DIAGNOSIS — Z79899 Other long term (current) drug therapy: Secondary | ICD-10-CM | POA: Diagnosis not present

## 2015-04-16 DIAGNOSIS — E1165 Type 2 diabetes mellitus with hyperglycemia: Secondary | ICD-10-CM | POA: Diagnosis not present

## 2015-04-19 ENCOUNTER — Other Ambulatory Visit: Payer: Self-pay | Admitting: *Deleted

## 2015-04-19 MED ORDER — WARFARIN SODIUM 5 MG PO TABS: ORAL_TABLET | ORAL | Status: DC

## 2015-04-19 NOTE — Telephone Encounter (Signed)
Nees refill on warfarin, completely out now

## 2015-04-24 ENCOUNTER — Other Ambulatory Visit: Payer: Self-pay | Admitting: *Deleted

## 2015-04-24 ENCOUNTER — Inpatient Hospital Stay: Payer: Commercial Managed Care - HMO | Attending: Internal Medicine

## 2015-04-24 DIAGNOSIS — Z86718 Personal history of other venous thrombosis and embolism: Secondary | ICD-10-CM

## 2015-04-24 DIAGNOSIS — C9110 Chronic lymphocytic leukemia of B-cell type not having achieved remission: Secondary | ICD-10-CM

## 2015-04-24 DIAGNOSIS — D696 Thrombocytopenia, unspecified: Secondary | ICD-10-CM | POA: Diagnosis not present

## 2015-04-24 LAB — CBC WITH DIFFERENTIAL/PLATELET
Basophils Absolute: 0.1 10*3/uL (ref 0–0.1)
Eosinophils Absolute: 0.2 10*3/uL (ref 0–0.7)
HCT: 43.5 % (ref 40.0–52.0)
Hemoglobin: 14.9 g/dL (ref 13.0–18.0)
Lymphs Abs: 8.9 10*3/uL — ABNORMAL HIGH (ref 1.0–3.6)
MCH: 29 pg (ref 26.0–34.0)
MCHC: 34.3 g/dL (ref 32.0–36.0)
MCV: 84.4 fL (ref 80.0–100.0)
MONO ABS: 0.4 10*3/uL (ref 0.2–1.0)
Monocytes Relative: 3 %
Neutro Abs: 4.8 10*3/uL (ref 1.4–6.5)
Neutrophils Relative %: 33 %
PLATELETS: 95 10*3/uL — AB (ref 150–440)
RBC: 5.15 MIL/uL (ref 4.40–5.90)
RDW: 14.4 % (ref 11.5–14.5)
WBC: 14.4 10*3/uL — AB (ref 3.8–10.6)

## 2015-04-24 LAB — PROTIME-INR
INR: 1.31
PROTHROMBIN TIME: 16.3 s — AB (ref 11.4–15.0)

## 2015-05-01 ENCOUNTER — Telehealth: Payer: Self-pay | Admitting: *Deleted

## 2015-05-01 ENCOUNTER — Other Ambulatory Visit: Payer: Self-pay | Admitting: *Deleted

## 2015-05-01 DIAGNOSIS — Z86718 Personal history of other venous thrombosis and embolism: Secondary | ICD-10-CM

## 2015-05-01 NOTE — Telephone Encounter (Signed)
V.O. Dr. Inez Pilgrim- Please call pt and inform him to take 2.5 Coumadin 5 mg pills on Saturdays and Wednesdays; and 2 Coumadin 5 mg pills the other 5 days of the week. Will recheck his levels on next Tuesday, 05/08/15... ~I left a message with pt's wife who repeated the instructions back that were given.Marland KitchenMarland Kitchen

## 2015-05-08 ENCOUNTER — Other Ambulatory Visit: Payer: Commercial Managed Care - HMO

## 2015-05-09 ENCOUNTER — Other Ambulatory Visit: Payer: Commercial Managed Care - HMO

## 2015-05-09 ENCOUNTER — Inpatient Hospital Stay: Payer: Commercial Managed Care - HMO | Attending: Internal Medicine

## 2015-05-09 DIAGNOSIS — D696 Thrombocytopenia, unspecified: Secondary | ICD-10-CM | POA: Diagnosis not present

## 2015-05-09 DIAGNOSIS — Z86718 Personal history of other venous thrombosis and embolism: Secondary | ICD-10-CM

## 2015-05-09 LAB — PROTIME-INR
INR: 1.64
PROTHROMBIN TIME: 19.3 s — AB (ref 11.4–15.0)

## 2015-05-17 DIAGNOSIS — E1165 Type 2 diabetes mellitus with hyperglycemia: Secondary | ICD-10-CM | POA: Diagnosis not present

## 2015-05-17 DIAGNOSIS — R739 Hyperglycemia, unspecified: Secondary | ICD-10-CM | POA: Diagnosis not present

## 2015-05-17 DIAGNOSIS — Z794 Long term (current) use of insulin: Secondary | ICD-10-CM | POA: Diagnosis not present

## 2015-05-18 DIAGNOSIS — Z79899 Other long term (current) drug therapy: Secondary | ICD-10-CM | POA: Insufficient documentation

## 2015-05-18 DIAGNOSIS — Z7901 Long term (current) use of anticoagulants: Secondary | ICD-10-CM | POA: Insufficient documentation

## 2015-05-18 DIAGNOSIS — E039 Hypothyroidism, unspecified: Secondary | ICD-10-CM | POA: Diagnosis not present

## 2015-05-18 DIAGNOSIS — E559 Vitamin D deficiency, unspecified: Secondary | ICD-10-CM | POA: Diagnosis not present

## 2015-05-18 DIAGNOSIS — E782 Mixed hyperlipidemia: Secondary | ICD-10-CM | POA: Diagnosis not present

## 2015-05-18 DIAGNOSIS — E119 Type 2 diabetes mellitus without complications: Secondary | ICD-10-CM | POA: Diagnosis not present

## 2015-05-21 ENCOUNTER — Other Ambulatory Visit: Payer: Self-pay | Admitting: Internal Medicine

## 2015-05-22 ENCOUNTER — Telehealth: Payer: Self-pay | Admitting: *Deleted

## 2015-05-22 MED ORDER — WARFARIN SODIUM 5 MG PO TABS: ORAL_TABLET | ORAL | Status: DC

## 2015-05-22 NOTE — Telephone Encounter (Signed)
escribed

## 2015-05-30 ENCOUNTER — Telehealth: Payer: Self-pay | Admitting: *Deleted

## 2015-05-31 ENCOUNTER — Other Ambulatory Visit: Payer: Self-pay | Admitting: *Deleted

## 2015-05-31 DIAGNOSIS — Z86718 Personal history of other venous thrombosis and embolism: Secondary | ICD-10-CM

## 2015-05-31 NOTE — Telephone Encounter (Signed)
[name], AGNP-C- Pt to come in for PT/INR on Tuesday, 06/05/15; will schedule next f/u visit then... Scheduling note entered; Laua Diab notified.Marland KitchenMarland Kitchen

## 2015-06-04 ENCOUNTER — Encounter: Payer: Self-pay | Admitting: *Deleted

## 2015-06-04 ENCOUNTER — Other Ambulatory Visit: Payer: Self-pay | Admitting: *Deleted

## 2015-06-05 ENCOUNTER — Other Ambulatory Visit: Payer: Self-pay | Admitting: Family Medicine

## 2015-06-05 ENCOUNTER — Other Ambulatory Visit: Payer: Commercial Managed Care - HMO

## 2015-06-12 ENCOUNTER — Ambulatory Visit (INDEPENDENT_AMBULATORY_CARE_PROVIDER_SITE_OTHER): Payer: Self-pay | Admitting: Urology

## 2015-06-12 ENCOUNTER — Encounter: Payer: Self-pay | Admitting: Urology

## 2015-06-12 VITALS — BP 116/74 | HR 84 | Resp 18 | Ht 68.0 in | Wt 195.6 lb

## 2015-06-12 DIAGNOSIS — R1030 Lower abdominal pain, unspecified: Secondary | ICD-10-CM | POA: Diagnosis not present

## 2015-06-12 DIAGNOSIS — E559 Vitamin D deficiency, unspecified: Secondary | ICD-10-CM | POA: Insufficient documentation

## 2015-06-12 DIAGNOSIS — K219 Gastro-esophageal reflux disease without esophagitis: Secondary | ICD-10-CM | POA: Insufficient documentation

## 2015-06-12 DIAGNOSIS — E039 Hypothyroidism, unspecified: Secondary | ICD-10-CM | POA: Insufficient documentation

## 2015-06-12 DIAGNOSIS — E785 Hyperlipidemia, unspecified: Secondary | ICD-10-CM | POA: Insufficient documentation

## 2015-06-12 DIAGNOSIS — E119 Type 2 diabetes mellitus without complications: Secondary | ICD-10-CM | POA: Insufficient documentation

## 2015-06-12 DIAGNOSIS — Z8551 Personal history of malignant neoplasm of bladder: Secondary | ICD-10-CM

## 2015-06-12 DIAGNOSIS — Z8603 Personal history of neoplasm of uncertain behavior: Secondary | ICD-10-CM | POA: Insufficient documentation

## 2015-06-12 LAB — MICROSCOPIC EXAMINATION: BACTERIA UA: NONE SEEN

## 2015-06-12 LAB — URINALYSIS, COMPLETE
BILIRUBIN UA: NEGATIVE
GLUCOSE, UA: NEGATIVE
Ketones, UA: NEGATIVE
Leukocytes, UA: NEGATIVE
NITRITE UA: NEGATIVE
PH UA: 6 (ref 5.0–7.5)
Protein, UA: NEGATIVE
Specific Gravity, UA: 1.015 (ref 1.005–1.030)
UUROB: 1 mg/dL (ref 0.2–1.0)

## 2015-06-12 NOTE — Progress Notes (Signed)
06/12/2015 10:05 AM   Kenneth Curry [birth year] [id number]  Referring provider: No referring provider defined for this encounter.  Chief Complaint  Patient presents with  . Abdominal Pain    occasional   patient has a history of bladder cancer. He has no abdominal pain today.   PMH: Past Medical History  Diagnosis Date  . Abdominal aortic aneurysm   . Pulmonary nodules   . Thrombocytopenia   . Presence of IVC filter   . Elevated cholesterol   . Diabetes mellitus, type 2   . Cellulitis   . History of primary bladder cancer   . Hematuria   . Urinary frequency   . Phimosis     Surgical History: Past Surgical History  Procedure Laterality Date  . Cholecystectomy    . Cochlear implant    . Appendectomy    . Abdominal aortic aneurysm repair      Home Medications:    Medication List       This list is accurate as of: 06/12/15 10:05 AM.  Always use your most recent med list.               ACCU-CHEK FASTCLIX LANCETS Misc     ACCU-CHEK NANO SMARTVIEW W/DEVICE Kit     ACCU-CHEK SMARTVIEW test strip  Generic drug:  glucose blood     Acetaminophen 500 MG coapsule  as needed.     CALCIUM-D PO  Take by mouth daily.     glimepiride 4 MG tablet  Commonly known as:  AMARYL  Take 1 tablet (4 mg) in the morning and 1 tablet (4 mg) in the evening with food.     Insulin Glargine 100 UNIT/ML Solostar Pen  Commonly known as:  LANTUS  Inject into the skin.     levothyroxine 150 MCG tablet  Commonly known as:  SYNTHROID, LEVOTHROID  Take by mouth.     lovastatin 20 MG tablet  Commonly known as:  MEVACOR  Take by mouth.     OMEPRAZOLE PO  Take 20 mg by mouth daily.     RELION PEN NEEDLES 29G X 12MM Misc  Generic drug:  Insulin Pen Needle     sitaGLIPtin 100 MG tablet  Commonly known as:  JANUVIA  Take by mouth.     warfarin 5 MG tablet  Commonly known as:  COUMADIN  2.5 Coumadin 5 mg pills (12.61m) on Saturdays and Wednesdays; and 2 Coumadin 5 mg  pills (10 mg) the other 5 days of the week.        Allergies:  Allergies  Allergen Reactions  . Hydrocodone-Acetaminophen Other (See Comments)    intolerate  . Metformin Diarrhea  . Atorvastatin Rash    Family History: Family History  Problem Relation Age of Onset  . Lung cancer Brother   . Colon cancer Brother   . Cancer Sister     Renal    Social History:  reports that he has been smoking.  He does not have any smokeless tobacco history on file. He reports that he does not drink alcohol. His drug history is not on file.  ROS: UROLOGY Frequent Urination?: No Hard to postpone urination?: No Burning/pain with urination?: No Get up at night to urinate?: No Leakage of urine?: No Urine stream starts and stops?: No Trouble starting stream?: No Do you have to strain to urinate?: No Blood in urine?: No Urinary tract infection?: No Sexually transmitted disease?: No Injury to kidneys or bladder?: No Painful intercourse?: No  Weak stream?: No Erection problems?: No Penile pain?: No Gastrointestinal Nausea?: No Vomiting?: No Indigestion/heartburn?: No Diarrhea?: No Constipation?: No Constitutional Fever: No Night sweats?: No Weight loss?: No Fatigue?: No Skin Skin rash/lesions?: No Itching?: No Eyes Blurred vision?: No Double vision?: No Ears/Nose/Throat Sore throat?: No Sinus problems?: No Hematologic/Lymphatic Swollen glands?: No Easy bruising?: No Cardiovascular Leg swelling?: No Chest pain?: No Respiratory Cough?: No Shortness of breath?: No Endocrine Excessive thirst?: No Psychologic Depression?: No Anxiety?: No   Physical Exam: BP 116/74 mmHg  Pulse 84  Resp 18  Ht _0  (1.727 m)  Wt 195 lb 9.6 oz (88.724 kg)  BMI 29.75 kg/m2  Constitutional:  Alert and oriented, No acute distress. HEENT: Bode AT, moist mucus membranes.  Trachea midline, no masses. Cardiovascular: No clubbing, cyanosis, or edema. Respiratory: Normal respiratory effort,  no increased work of breathing. GI: Abdomen is soft, nontender, nondistended, no abdominal masses GU: No CVA tenderness. Phimosis that is not severe prostate is small and nonnodular rectal exam is negative for tumor mass or growth Skin: No rashes, but he has bruising secondary to Coumadin use but no other suspicious lesions Lymph: No cervical or inguinal adenopathy. Neurologic: Grossly intact, no focal deficits, moving all 4 extremities. Psychiatric: Normal mood and affect.  Laboratory Data: Lab Results  Component Value Date   WBC 14.4* 04/24/2015   HGB 14.9 04/24/2015   HCT 43.5 04/24/2015   MCV 84.4 04/24/2015   PLT 95* 04/24/2015    Lab Results  Component Value Date   CREATININE 1.04 05/23/2014    No results found for: PSA  No results found for: TESTOSTERONE  No results found for: HGBA1C  Urinalysis No results found for: COLORURINE, APPEARANCEUR, LABSPEC, PHURINE, GLUCOSEU, HGBUR, BILIRUBINUR, KETONESUR, PROTEINUR, UROBILINOGEN, NITRITE, LEUKOCYTESUR  Pertinent Imaging: None  Assessment & Plan:  Recheck for bladder cancer with a cystoscopy in a month  1. No lower abdominal pain today but history of bladder cancer in the past which needs to be followed up with cystoscopy at 6 month intervals. A cystoscopy has been scheduled for 1 month which will put him on time for his 6 month interval cystoscopy - Urinalysis, Complete   No Follow-up on file.  Collier Flowers, [facility] [address] [place], [place] [address] [phone]

## 2015-07-16 DIAGNOSIS — K219 Gastro-esophageal reflux disease without esophagitis: Secondary | ICD-10-CM | POA: Diagnosis not present

## 2015-07-16 DIAGNOSIS — E119 Type 2 diabetes mellitus without complications: Secondary | ICD-10-CM | POA: Diagnosis not present

## 2015-07-16 DIAGNOSIS — E039 Hypothyroidism, unspecified: Secondary | ICD-10-CM | POA: Diagnosis not present

## 2015-07-16 DIAGNOSIS — Z7901 Long term (current) use of anticoagulants: Secondary | ICD-10-CM | POA: Diagnosis not present

## 2015-07-17 ENCOUNTER — Other Ambulatory Visit: Payer: Self-pay | Admitting: Urology

## 2015-08-01 ENCOUNTER — Other Ambulatory Visit: Payer: Self-pay | Admitting: Urology

## 2015-08-20 DIAGNOSIS — Z794 Long term (current) use of insulin: Secondary | ICD-10-CM | POA: Diagnosis not present

## 2015-08-20 DIAGNOSIS — E119 Type 2 diabetes mellitus without complications: Secondary | ICD-10-CM | POA: Diagnosis not present

## 2015-08-22 ENCOUNTER — Other Ambulatory Visit: Payer: Self-pay | Admitting: Urology

## 2015-08-28 ENCOUNTER — Ambulatory Visit: Payer: Commercial Managed Care - HMO

## 2015-08-28 ENCOUNTER — Other Ambulatory Visit: Payer: Commercial Managed Care - HMO

## 2015-09-03 ENCOUNTER — Ambulatory Visit
Admission: EM | Admit: 2015-09-03 | Discharge: 2015-09-03 | Disposition: A | Discharge status: Home / Self Care | Payer: Commercial Managed Care - HMO | Attending: Family Medicine | Admitting: Family Medicine

## 2015-09-03 ENCOUNTER — Encounter: Payer: Self-pay | Admitting: Emergency Medicine

## 2015-09-03 DIAGNOSIS — Z029 Encounter for administrative examinations, unspecified: Secondary | ICD-10-CM

## 2015-09-03 DIAGNOSIS — Z024 Encounter for examination for driving license: Secondary | ICD-10-CM

## 2015-09-03 LAB — DEPT OF TRANSP DIPSTICK, URINE (ARMC ONLY)
Glucose, UA: 250 mg/dL — AB
Protein, ur: NEGATIVE mg/dL
Specific Gravity, Urine: <1.005 — ABNORMAL LOW (ref 1.005–1.030)

## 2015-09-03 NOTE — Discharge Instructions (Signed)
Medical Screening Exam A medical screening exam has been done. This exam helps find the cause of your problem and determines whether you need emergency treatment. Your exam has shown that you do not need emergency treatment at this point. It is safe for you to go to your caregiver's office or clinic for treatment. You should make an appointment today to see your caregiver as soon as he or she is available. Depending on your illness, your symptoms and condition can change over time. If your condition gets worse or you develop new or troubling symptoms before you see your caregiver, you should return to the emergency department for further evaluation.  Document Released: 01/01/2005 Document Revised: 02/16/2012 Document Reviewed: 08/13/2011 [facility] Patient Information 2015 [place], [place]. This information is not intended to replace advice given to you by your health care provider. Make sure you discuss any questions you have with your health care provider.  Drug Testing Drug testing means analyzing your blood or urine to determine if a drug is present or to check the level or concentration of a drug or alcohol. Drug tests are most often used to help guide treatment of patients with seizures. They are also used to measure the levels of other medicines including digoxin and lithium. Urine tests for recreational drugs (narcotics, cocaine, amphetamines, marijuana) are often required as part of a pre-employment exam. They may also be used in evaluating work-related injuries. In blood tests to evaluate drug levels, there may be variations in the results. The results depend on when you took your last dose and other factors. There may be small differences between normal and abnormal results. If your test results are above or below the normal range, you may need additional tests or a change in your drug treatment. This will improve the benefit or reduce the risk of toxicity or side effects.  SEEK MEDICAL CARE  IF: You have any questions about your test results or treatment plan. Document Released: 11/24/2005 Document Revised: 02/16/2012 Document Reviewed: 05/18/2007 [facility] Patient Information 2015 [place], [place]. This information is not intended to replace advice given to you by your health care provider. Make sure you discuss any questions you have with your health care provider.  Drug Testing Drug testing means analyzing your blood or urine to determine if a drug is present or to check the level or concentration of a drug or alcohol. Drug tests are most often used to help guide treatment of patients with seizures. They are also used to measure the levels of other medicines including digoxin and lithium. Urine tests for recreational drugs (narcotics, cocaine, amphetamines, marijuana) are often required as part of a pre-employment exam. They may also be used in evaluating work-related injuries. In blood tests to evaluate drug levels, there may be variations in the results. The results depend on when you took your last dose and other factors. There may be small differences between normal and abnormal results. If your test results are above or below the normal range, you may need additional tests or a change in your drug treatment. This will improve the benefit or reduce the risk of toxicity or side effects.  SEEK MEDICAL CARE IF: You have any questions about your test results or treatment plan. Document Released: 11/24/2005 Document Revised: 02/16/2012 Document Reviewed: 05/18/2007 [facility] Patient Information 2015 [place], [place]. This information is not intended to replace advice given to you by your health care provider. Make sure you discuss any questions you have with your health care provider. Hearing Loss  A hearing loss is sometimes called deafness. Hearing loss may be partial or total. CAUSES Hearing loss may be caused by:  Wax in the ear canal.  Infection of the ear canal.  Infection of the  middle ear.  Trauma to the ear or surrounding area.  Fluid in the middle ear.  A hole in the eardrum (perforated eardrum).  Exposure to loud sounds or music.  Problems with the hearing nerve.  Certain medications. Hearing loss without wax, infection, or a history of injury may mean that the nerve is involved. Hearing loss with severe dizziness, nausea and vomiting or ringing in the ear may suggest a hearing nerve irritation or problems in the middle or inner ear. If hearing loss is untreated, there is a greater likelihood for residual or permanent hearing loss. DIAGNOSIS A hearing test (audiometry) assesses hearing loss. The audiometry test needs to be performed by a hearing specialist (audiologist). TREATMENT Treatment for recent onset of hearing loss may include:  Ear wax removal.  Medications that kill germs (antibiotics).  Cortisone medications.  Prompt follow up with the appropriate specialist. Return of hearing depends on the cause of your hearing loss, so proper medical follow-up is important. Some hearing loss may not be reversible, and a caregiver should discuss care and treatment options with you. SEEK MEDICAL CARE IF:   You have a severe headache, dizziness, or changes in vision.  You have new or increased weakness.  You develop repeated vomiting or other serious medical problems.  You have a fever. Document Released: 11/24/2005 Document Revised: 02/16/2012 Document Reviewed: 03/21/2010 [facility] Patient Information 2015 [place], [place]. This information is not intended to replace advice given to you by your health care provider. Make sure you discuss any questions you have with your health care provider. DASH Eating Plan DASH stands for "Dietary Approaches to Stop Hypertension." The DASH eating plan is a healthy eating plan that has been shown to reduce high blood pressure (hypertension). Additional health benefits may include reducing the risk of type 2 diabetes  mellitus, heart disease, and stroke. The DASH eating plan may also help with weight loss. WHAT DO I NEED TO KNOW ABOUT THE DASH EATING PLAN? For the DASH eating plan, you will follow these general guidelines:  Choose foods with a percent daily value for sodium of less than 5% (as listed on the food label).  Use salt-free seasonings or herbs instead of table salt or sea salt.  Check with your health care provider or pharmacist before using salt substitutes.  Eat lower-sodium products, often labeled as "lower sodium" or "no salt added."  Eat fresh foods.  Eat more vegetables, fruits, and low-fat dairy products.  Choose whole grains. Look for the word "whole" as the first word in the ingredient list.  Choose fish and skinless chicken or [place] more often than red meat. Limit fish, poultry, and meat to 6 oz (170 g) each day.  Limit sweets, desserts, sugars, and sugary drinks.  Choose heart-healthy fats.  Limit cheese to 1 oz (28 g) per day.  Eat more home-cooked food and less restaurant, buffet, and fast food.  Limit fried foods.  Cook foods using methods other than frying.  Limit canned vegetables. If you do use them, rinse them well to decrease the sodium.  When eating at a restaurant, ask that your food be prepared with less salt, or no salt if possible. WHAT FOODS CAN I EAT? Seek help from a dietitian for individual calorie needs. Grains Whole grain or  whole wheat bread. Brown rice. Whole grain or whole wheat pasta. Quinoa, bulgur, and whole grain cereals. Low-sodium cereals. Corn or whole wheat flour tortillas. Whole grain cornbread. Whole grain crackers. Low-sodium crackers. Vegetables Fresh or frozen vegetables (raw, steamed, roasted, or grilled). Low-sodium or reduced-sodium tomato and vegetable juices. Low-sodium or reduced-sodium tomato sauce and paste. Low-sodium or reduced-sodium canned vegetables.  Fruits All fresh, canned (in natural juice), or frozen fruits. Meat  and Other Protein Products Ground beef (85% or leaner), grass-fed beef, or beef trimmed of fat. Skinless chicken or [place]. Ground chicken or [place]. Pork trimmed of fat. All fish and seafood. Eggs. Dried beans, peas, or lentils. Unsalted nuts and seeds. Unsalted canned beans. Dairy Low-fat dairy products, such as skim or 1% milk, 2% or reduced-fat cheeses, low-fat ricotta or cottage cheese, or plain low-fat yogurt. Low-sodium or reduced-sodium cheeses. Fats and Oils Tub margarines without trans fats. Light or reduced-fat mayonnaise and salad dressings (reduced sodium). Avocado. Safflower, olive, or canola oils. Natural peanut or almond butter. Other Unsalted popcorn and pretzels. The items listed above may not be a complete list of recommended foods or beverages. Contact your dietitian for more options. WHAT FOODS ARE NOT RECOMMENDED? Grains White bread. White pasta. White rice. Refined cornbread. Bagels and croissants. Crackers that contain trans fat. Vegetables Creamed or fried vegetables. Vegetables in a cheese sauce. Regular canned vegetables. Regular canned tomato sauce and paste. Regular tomato and vegetable juices. Fruits Dried fruits. Canned fruit in light or heavy syrup. Fruit juice. Meat and Other Protein Products Fatty cuts of meat. Ribs, chicken wings, bacon, sausage, bologna, salami, chitterlings, fatback, hot dogs, bratwurst, and packaged luncheon meats. Salted nuts and seeds. Canned beans with salt. Dairy Whole or 2% milk, cream, half-and-half, and cream cheese. Whole-fat or sweetened yogurt. Full-fat cheeses or blue cheese. Nondairy creamers and whipped toppings. Processed cheese, cheese spreads, or cheese curds. Condiments Onion and garlic salt, seasoned salt, table salt, and sea salt. Canned and packaged gravies. Worcestershire sauce. Tartar sauce. Barbecue sauce. Teriyaki sauce. Soy sauce, including reduced sodium. Steak sauce. Fish sauce. Oyster sauce. Cocktail sauce.  Horseradish. Ketchup and mustard. Meat flavorings and tenderizers. Bouillon cubes. Hot sauce. Tabasco sauce. Marinades. Taco seasonings. Relishes. Fats and Oils Butter, stick margarine, lard, shortening, ghee, and bacon fat. Coconut, palm kernel, or palm oils. Regular salad dressings. Other Pickles and olives. Salted popcorn and pretzels. The items listed above may not be a complete list of foods and beverages to avoid. Contact your dietitian for more information. WHERE CAN I FIND MORE INFORMATION? National Heart, Lung, and Blood Institute: travelstabloid.com Document Released: 11/13/2011 Document Revised: 04/10/2014 Document Reviewed: 09/28/2013 [facility] Patient Information 2015 [place], [place]. This information is not intended to replace advice given to you by your health care provider. Make sure you discuss any questions you have with your health care provider. Basic Carbohydrate Counting for Diabetes Mellitus Carbohydrate counting is a method for keeping track of the amount of carbohydrates you eat. Eating carbohydrates naturally increases the level of sugar (glucose) in your blood, so it is important for you to know the amount that is okay for you to have in every meal. Carbohydrate counting helps keep the level of glucose in your blood within normal limits. The amount of carbohydrates allowed is different for every person. A dietitian can help you calculate the amount that is right for you. Once you know the amount of carbohydrates you can have, you can count the carbohydrates in the foods you want to eat. Carbohydrates  are found in the following foods:  Grains, such as breads and cereals.  Dried beans and soy products.  Starchy vegetables, such as potatoes, peas, and corn.  Fruit and fruit juices.  Milk and yogurt.  Sweets and snack foods, such as cake, cookies, candy, chips, soft drinks, and fruit drinks. CARBOHYDRATE COUNTING There are two ways to  count the carbohydrates in your food. You can use either of the methods or a combination of both. Reading the "Nutrition Facts" on [place] The "Nutrition Facts" is an area that is included on the labels of almost all packaged food and beverages in the [place]. It includes the serving size of that food or beverage and information about the nutrients in each serving of the food, including the grams (g) of carbohydrate per serving.  Decide the number of servings of this food or beverage that you will be able to eat or drink. Multiply that number of servings by the number of grams of carbohydrate that is listed on the label for that serving. The total will be the amount of carbohydrates you will be having when you eat or drink this food or beverage. Learning Standard Serving Sizes of Food When you eat food that is not packaged or does not include "Nutrition Facts" on the label, you need to measure the servings in order to count the amount of carbohydrates.A serving of most carbohydrate-rich foods contains about 15 g of carbohydrates. The following list includes serving sizes of carbohydrate-rich foods that provide 15 g ofcarbohydrate per serving:   1 slice of bread (1 oz) or 1 six-inch tortilla.    of a hamburger bun or English muffin.  4-6 crackers.   cup unsweetened dry cereal.    cup hot cereal.   cup rice or pasta.    cup mashed potatoes or  of a large baked potato.  1 cup fresh fruit or one small piece of fruit.    cup canned or frozen fruit or fruit juice.  1 cup milk.   cup plain fat-free yogurt or yogurt sweetened with artificial sweeteners.   cup cooked dried beans or starchy vegetable, such as peas, corn, or potatoes.  Decide the number of standard-size servings that you will eat. Multiply that number of servings by 15 (the grams of carbohydrates in that serving). For example, if you eat 2 cups of strawberries, you will have eaten 2 servings and 30 g of  carbohydrates (2 servings x 15 g = 30 g). For foods such as soups and casseroles, in which more than one food is mixed in, you will need to count the carbohydrates in each food that is included. EXAMPLE OF CARBOHYDRATE COUNTING Sample Dinner  3 oz chicken breast.   cup of brown rice.   cup of corn.  1 cup milk.   1 cup strawberries with sugar-free whipped topping.  Carbohydrate Calculation Step 1: Identify the foods that contain carbohydrates:   Rice.   Corn.   Milk.   Strawberries. Step 2:Calculate the number of servings eaten of each:   2 servings of rice.   1 serving of corn.   1 serving of milk.   1 serving of strawberries. Step 3: Multiply each of those number of servings by 15 g:   2 servings of rice x 15 g = 30 g.   1 serving of corn x 15 g = 15 g.   1 serving of milk x 15 g = 15 g.   1 serving of  strawberries x 15 g = 15 g. Step 4: Add together all of the amounts to find the total grams of carbohydrates eaten: 30 g + 15 g + 15 g + 15 g = 75 g. Document Released: 11/24/2005 Document Revised: 04/10/2014 Document Reviewed: 10/21/2013 [facility] Patient Information 2015 [place], [place]. This information is not intended to replace advice given to you by your health care provider. Make sure you discuss any questions you have with your health care provider. Diabetes and Exercise Exercising regularly is important. It is not just about losing weight. It has many health benefits, such as:  Improving your overall fitness, flexibility, and endurance.  Increasing your bone density.  Helping with weight control.  Decreasing your body fat.  Increasing your muscle strength.  Reducing stress and tension.  Improving your overall health. People with diabetes who exercise gain additional benefits because exercise:  Reduces appetite.  Improves the body's use of blood sugar (glucose).  Helps lower or control blood glucose.  Decreases blood  pressure.  Helps control blood lipids (such as cholesterol and triglycerides).  Improves the body's use of the hormone insulin by:  Increasing the body's insulin sensitivity.  Reducing the body's insulin needs.  Decreases the risk for heart disease because exercising:  Lowers cholesterol and triglycerides levels.  Increases the levels of good cholesterol (such as high-density lipoproteins [HDL]) in the body.  Lowers blood glucose levels. YOUR ACTIVITY PLAN  Choose an activity that you enjoy and set realistic goals. Your health care provider or diabetes educator can help you make an activity plan that works for you. Exercise regularly as directed by your health care provider. This includes:  Performing resistance training twice a week such as push-ups, sit-ups, lifting weights, or using resistance bands.  Performing 150 minutes of cardio exercises each week such as walking, running, or playing sports.  Staying active and spending no more than 90 minutes at one time being inactive. Even short bursts of exercise are good for you. Three 10-minute sessions spread throughout the day are just as beneficial as a single 30-minute session. Some exercise ideas include:  Taking the dog for a walk.  Taking the stairs instead of the elevator.  Dancing to your favorite song.  Doing an exercise video.  Doing your favorite exercise with a friend. RECOMMENDATIONS FOR EXERCISING WITH TYPE 1 OR TYPE 2 DIABETES   Check your blood glucose before exercising. If blood glucose levels are greater than 240 mg/dL, check for urine ketones. Do not exercise if ketones are present.  Avoid injecting insulin into areas of the body that are going to be exercised. For example, avoid injecting insulin into:  The arms when playing tennis.  The legs when jogging.  Keep a record of:  Food intake before and after you exercise.  Expected peak times of insulin action.  Blood glucose levels before and after  you exercise.  The type and amount of exercise you have done.  Review your records with your health care provider. Your health care provider will help you to develop guidelines for adjusting food intake and insulin amounts before and after exercising.  If you take insulin or oral hypoglycemic agents, watch for signs and symptoms of hypoglycemia. They include:  Dizziness.  Shaking.  Sweating.  Chills.  Confusion.  Drink plenty of water while you exercise to prevent dehydration or heat stroke. Body water is lost during exercise and must be replaced.  Talk to your health care provider before starting an exercise program to  make sure it is safe for you. Remember, almost any type of activity is better than none. Document Released: 02/14/2004 Document Revised: 04/10/2014 Document Reviewed: 05/03/2013 [facility] Patient Information 2015 [place], [place]. This information is not intended to replace advice given to you by your health care provider. Make sure you discuss any questions you have with your health care provider. Abdominal Aortic Aneurysm An aneurysm is a weakened or damaged part of an artery wall that bulges from the normal force of blood pumping through the body. An abdominal aortic aneurysm is an aneurysm that occurs in the lower part of the aorta, the main artery of the body.  The major concern with an abdominal aortic aneurysm is that it can enlarge and burst (rupture) or blood can flow between the layers of the wall of the aorta through a tear (aorticdissection). Both of these conditions can cause bleeding inside the body and can be life threatening unless diagnosed and treated promptly. CAUSES  The exact cause of an abdominal aortic aneurysm is unknown. Some contributing factors are:   A hardening of the arteries caused by the buildup of fat and other substances in the lining of a blood vessel (arteriosclerosis).  Inflammation of the walls of an artery (arteritis).    Connective tissue diseases, such as Marfan syndrome.   Abdominal trauma.   An infection, such as syphilis or staphylococcus, in the wall of the aorta (infectious aortitis) caused by bacteria. RISK FACTORS  Risk factors that contribute to an abdominal aortic aneurysm may include:  Age older than 61 years.   High blood pressure (hypertension).  Male gender.  Ethnicity (white race).  Obesity.  Family history of aneurysm (first degree relatives only).  Tobacco use. PREVENTION  The following healthy lifestyle habits may help decrease your risk of abdominal aortic aneurysm:  Quitting smoking. Smoking can raise your blood pressure and cause arteriosclerosis.  Limiting or avoiding alcohol.  Keeping your blood pressure, blood sugar level, and cholesterol levels within normal limits.  Decreasing your salt intake. In somepeople, too much salt can raise blood pressure and increase your risk of abdominal aortic aneurysm.  Eating a diet low in saturated fats and cholesterol.  Increasing your fiber intake by including whole grains, vegetables, and fruits in your diet. Eating these foods may help lower blood pressure.  Maintaining a healthy weight.  Staying physically active and exercising regularly. SYMPTOMS  The symptoms of abdominal aortic aneurysm may vary depending on the size and rate of growth of the aneurysm.Most grow slowly and do not have any symptoms. When symptoms do occur, they may include:  Pain (abdomen, side, lower back, or groin). The pain may vary in intensity. A sudden onset of severe pain may indicate that the aneurysm has ruptured.  Feeling full after eating only small amounts of food.  Nausea or vomiting or both.  Feeling a pulsating lump in the abdomen.  Feeling faint or passing out. DIAGNOSIS  Since most unruptured abdominal aortic aneurysms have no symptoms, they are often discovered during diagnostic exams for other conditions. An aneurysm may  be found during the following procedures:  Ultrasonography (A one-time screening for abdominal aortic aneurysm by ultrasonography is also recommended for all men aged 72-75 years who have ever smoked).  X-ray exams.  A computed tomography (CT).  Magnetic resonance imaging (MRI).  Angiography or arteriography. TREATMENT  Treatment of an abdominal aortic aneurysm depends on the size of your aneurysm, your age, and risk factors for rupture. Medication to control blood pressure  and pain may be used to manage aneurysms smaller than 6 cm. Regular monitoring for enlargement may be recommended by your caregiver if:  The aneurysm is 3-4 cm in size (an annual ultrasonography may be recommended).  The aneurysm is 4-4.5 cm in size (an ultrasonography every 6 months may be recommended).  The aneurysm is larger than 4.5 cm in size (your caregiver may ask that you be examined by a vascular surgeon). If your aneurysm is larger than 6 cm, surgical repair may be recommended. There are two main methods for repair of an aneurysm:   Endovascular repair (a minimally invasive surgery). This is done most often.  Open repair. This method is used if an endovascular repair is not possible. Document Released: 09/03/2005 Document Revised: 03/21/2013 Document Reviewed: 12/24/2012 [facility] Patient Information 2015 [place], [place]. This information is not intended to replace advice given to you by your health care provider. Make sure you discuss any questions you have with your health care provider.

## 2015-09-03 NOTE — ED Provider Notes (Signed)
CSN: [id number]     Arrival date & time 09/03/15  7793 History   First MD Initiated Contact with Patient 09/03/15 [phone]     Chief Complaint  Patient presents with  . 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  Started on lantus earlier this year by [facility] Dr Raechel Ache and has been getting dose adjusted.  Still on po medications also.  Cochlear implant right ear x 15 years.  In ear hearing aid x 7-10 years left having some issues has been sent in for adjustments this year.  Has glucometer with him.  Reviewed readings with patient.  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    Past Medical History  Diagnosis Date  . Abdominal aortic aneurysm   . Pulmonary nodules   . Thrombocytopenia   . Presence of IVC filter   . Elevated cholesterol   . Diabetes mellitus, type 2   . Cellulitis   . History of primary bladder cancer   . Hematuria   . Urinary frequency   . Phimosis    Past Surgical History  Procedure Laterality Date  . Cholecystectomy    . Cochlear implant    . Appendectomy    . Abdominal aortic aneurysm repair     Family History  Problem Relation Age of Onset  . Lung cancer Brother   . Colon cancer Brother   . Cancer Sister     Renal   Social History  Substance Use Topics  . Smoking status: Current Every Day Smoker -- 1.00  packs/day    Types: Cigarettes  . Smokeless tobacco: None  . Alcohol Use: No    Review of Systems  Constitutional: Negative for fever, chills, diaphoresis, activity change, appetite change, fatigue and unexpected weight change.  HENT: Positive for hearing loss. Negative for congestion, dental problem, drooling, ear discharge, ear pain, facial swelling, mouth sores, nosebleeds, sore throat, trouble swallowing and voice change.   Eyes: Negative for photophobia, pain, discharge, redness, itching and visual disturbance.  Respiratory: Negative for cough, choking, chest tightness, shortness of breath, wheezing and stridor.   Cardiovascular: Positive for leg swelling. Negative for chest pain and palpitations.  Gastrointestinal: Negative for nausea, vomiting, abdominal pain, diarrhea, constipation, blood in stool, abdominal distention and rectal pain.  Endocrine: Negative for cold intolerance, heat intolerance, polydipsia, polyphagia and polyuria.  Genitourinary: Negative for dysuria, flank pain, scrotal swelling, enuresis and difficulty urinating.  Musculoskeletal: Positive for arthralgias. Negative for myalgias, back pain, joint swelling, gait problem, neck pain and neck stiffness.  Skin: Positive for color change. Negative for pallor, rash and wound.  Allergic/Immunologic: Negative for environmental allergies and food allergies.  Neurological: Negative for dizziness, tremors, seizures, syncope, facial asymmetry, speech difficulty, weakness, light-headedness, numbness and headaches.  Hematological: Negative for adenopathy. Does not bruise/bleed easily.  Psychiatric/Behavioral: Negative for behavioral problems, confusion, sleep disturbance and agitation.    Allergies  Hydrocodone-acetaminophen; Metformin; and Atorvastatin  Home  Medications   Prior to Admission medications   Medication Sig Start Date End Date Taking? Authorizing Cozy Veale  ACCU-CHEK FASTCLIX LANCETS MISC  03/16/15   Historical  Sanja Elizardo, MD  ACCU-CHEK SMARTVIEW test strip  03/16/15   Historical Aleksandar Duve, MD  Acetaminophen 500 MG coapsule as needed.  03/27/14   Historical Navia Lindahl, MD  Blood Glucose Monitoring Suppl (ACCU-CHEK NANO SMARTVIEW) W/DEVICE KIT  03/16/15   Historical Aletta Edmunds, MD  Calcium Carbonate-Vitamin D (CALCIUM-D PO) Take by mouth daily.    Historical Deyvi Bonanno, MD  glimepiride (AMARYL) 4 MG tablet Take 1 tablet (4 mg) in the morning and 1 tablet (4 mg) in the evening with food. 03/26/15   Historical Vincenzo Stave, MD  Insulin Glargine (LANTUS) 100 UNIT/ML Solostar Pen Inject into the skin. 04/16/15   Historical Gerritt Galentine, MD  levothyroxine (SYNTHROID, LEVOTHROID) 150 MCG tablet Take by mouth. 05/30/14   Historical Tyrica Afzal, MD  lovastatin (MEVACOR) 20 MG tablet Take by mouth. 11/15/14   Historical Delailah Spieth, MD  OMEPRAZOLE PO Take 20 mg by mouth daily.    Historical Datra Clary, MD  RELION PEN NEEDLES 29G X 12MM [place]  04/22/15   Historical Kyrollos Cordell, MD  sitaGLIPtin (JANUVIA) 100 MG tablet Take by mouth. 01/01/15   Historical Marcel Gary, MD  warfarin (COUMADIN) 5 MG tablet 2.5 Coumadin 5 mg pills (12.56m) on Saturdays and Wednesdays; and 2 Coumadin 5 mg pills (10 mg) the other 5 days of the week. 05/22/15   LEvlyn Kanner NP   Meds Ordered and Administered this Visit  Medications - No data to display  BP 139/78 mmHg  Pulse 60  Temp(Src) 97 F (36.1 C) (Oral)  Resp 18  Ht 5' 7.5" (1.715 m)  Wt 199 lb (90.266 kg)  BMI 30.69 kg/m2 No data found.   Physical Exam  Constitutional: He is oriented to person, place, and time. Vital signs are normal. He appears well-developed and well-nourished.  HENT:  Head: Normocephalic and atraumatic.  Right Ear: External ear normal.  Left Ear: External ear normal.  Nose: Nose normal. No mucosal edema, rhinorrhea, nose lacerations, sinus tenderness, nasal deformity, septal deviation or nasal septal hematoma. No epistaxis.  No foreign bodies. Right sinus exhibits no maxillary sinus  tenderness and no frontal sinus tenderness. Left sinus exhibits no maxillary sinus tenderness and no frontal sinus tenderness.  Mouth/Throat: Uvula is midline, oropharynx is clear and moist and mucous membranes are normal. Mucous membranes are not pale, not dry and not cyanotic. He does not have dentures. No oral lesions. No trismus in the jaw. Normal dentition. No dental abscesses, uvula swelling, lacerations or dental caries. No oropharyngeal exudate, posterior oropharyngeal edema, posterior oropharyngeal erythema or tonsillar abscesses.  Eyes: Conjunctivae, EOM and lids are normal. Pupils are equal, round, and reactive to light. Right eye exhibits no discharge. Left eye exhibits no discharge. No scleral icterus.  Neck: Trachea normal and normal range of motion. Neck supple. No tracheal deviation present. No thyromegaly present.  Cardiovascular: Normal rate, regular rhythm, S1 normal, S2 normal, normal heart sounds and intact distal pulses.  PMI is not displaced.  Exam reveals no gallop and no friction rub.   No murmur heard. Pulses:      Dorsalis pedis pulses are 2+ on the right side, and 2+ on the left side.       Posterior tibial pulses are 2+ on the right side, and 2+ on the left side.  Pulmonary/Chest: Effort normal and breath sounds normal. No accessory muscle usage or stridor. No respiratory distress. He  has no decreased breath sounds. He has no wheezes. He has no rhonchi. He has no rales. He exhibits no tenderness.  Abdominal: Soft. Bowel sounds are normal. He exhibits no shifting dullness, no distension, no pulsatile liver, no fluid wave, no abdominal bruit, no ascites, no pulsatile midline mass and no mass. There is no hepatosplenomegaly. There is no tenderness. There is no rigidity, no rebound, no guarding, no CVA tenderness, no tenderness at McBurney's point and negative Murphy's sign. A hernia is present. Hernia confirmed positive in the ventral area. Hernia confirmed negative in the right  inguinal area and confirmed negative in the left inguinal area.    Dull to percussion x 4 quads  Genitourinary: Penis normal. Cremasteric reflex is present.  Musculoskeletal: Normal range of motion. He exhibits edema. He exhibits no tenderness.       Right shoulder: Normal.       Left shoulder: Normal.       Right elbow: Normal.      Left elbow: Normal.       Right wrist: Normal.       Left wrist: Normal.       Right hip: Normal.       Left hip: Normal.       Right knee: Normal.       Left knee: Normal.       Right ankle: Normal.       Left ankle: Normal.       Cervical back: Normal.       Thoracic back: Normal.       Lumbar back: Normal.       Right upper arm: Normal.       Left upper arm: Normal.       Right forearm: Normal.       Left forearm: Normal.       Right hand: Normal.       Left hand: Normal.       Right upper leg: Normal.       Left upper leg: Normal.       Right lower leg: Normal.       Left lower leg: He exhibits swelling.       Right foot: Normal.       Left foot: Normal.  Lymphadenopathy:    He has no cervical adenopathy.       Right: No inguinal adenopathy present.       Left: No inguinal adenopathy present.  Neurological: He is alert and oriented to person, place, and time. He has normal reflexes. He displays no atrophy, no tremor and normal reflexes. No cranial nerve deficit or sensory deficit. He exhibits normal muscle tone. He displays no seizure activity. Coordination and gait normal. GCS eye subscore is 4. GCS verbal subscore is 5. GCS motor subscore is 6.  Reflex Scores:      Patellar reflexes are 2+ on the right side and 2+ on the left side.      Achilles reflexes are 2+ on the right side and 2+ on the left side. Skin: Skin is warm, dry and intact. Rash noted. No abrasion, no bruising, no burn, no ecchymosis, no laceration, no lesion, no petechiae and no purpura noted. Rash is macular. Rash is not papular, not maculopapular, not nodular, not  pustular, not vesicular and not urticarial. He is not diaphoretic. No cyanosis or erythema. No pallor. Nails show no clubbing.     Psychiatric: He has a normal mood and affect. His speech is normal and  behavior is normal. Judgment and thought content normal. Cognition and memory are normal.  Nursing note and vitals reviewed.   ED Course  Procedures (including critical care time)  Labs Review Labs Reviewed  DEPT OF TRANSP DIPSTICK, URINE([facility] ONLY) - Abnormal; Notable for the following:    Glucose, UA 250 (*)    Specific Gravity, Urine <1.005 (*)    Hgb urine dipstick TRACE (*)    All other components within normal limits    Imaging Review No results found.   Visual Acuity Review  Right Eye Distance: 20/25 corrected Left Eye Distance: 20/20 corrected Bilateral Distance: 20/20 corrected  0940 Discussed urinalysis results with patient and given copy of report.  Instructed to follow up with [facility] for re-evaluation.  Patient verbalized understanding of information/instructions, agreed with plan of care and had no further questions at this time.  1117 Discussed audiogram results with patient and given copy of report.  Failed DOT hearing test due to greater than 40dB hearing loss bilateral ears.  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  Patient reported he would do so, agreed with plan of care and had no further questions at this time.   MDM   1. Encounter for Department of Transportation (DOT) examination for driving license renewal    Did not meet requirements for DOT exam today.  Needs follow up with specialists.  Information entered in [place] patient required diabetes insulin exemption given  paperwork to have endocrinology and opthalmologist complete from DOT website. Discussed also requires cardiovascular follow up for AAA repair 2011 and bilateral ileac aneurysms follow up to confirm stability/no worsening.  Patient had not scheduled yet this year.  Patient given copy of [place] follow [place].  See copies in record manager for [place], [id number].  Patient instructed to follow up for re-evaluation this year once audiology, cardiovascular and oncology appts completed with notes.  Cannot pass unless hearing aids adjusted and aneurysm evaluations completed to show stable/no progression size and then diabetes insulin federal waiver completion package still requires to be submitted.  Discussed with patient urinalysis was abnormal.  Discussed weight loss, exercise, glucometer to be carried in truck, spare batteries for hearing aids and glucometer, form of sugar if hypoglycemia, spare pair of glasses.  Patient verbalized understanding of information/instructions, agreed with plan of care and had no further questions at this time.   Olen Cordial, NP 09/03/15 [phone]

## 2015-09-03 NOTE — ED Notes (Signed)
Patient here for DOT Physical.  

## 2015-09-04 ENCOUNTER — Other Ambulatory Visit: Payer: Commercial Managed Care - HMO

## 2015-09-04 ENCOUNTER — Ambulatory Visit: Payer: Commercial Managed Care - HMO | Admitting: Internal Medicine

## 2015-09-11 ENCOUNTER — Ambulatory Visit: Payer: Commercial Managed Care - HMO | Admitting: Internal Medicine

## 2015-09-11 ENCOUNTER — Other Ambulatory Visit: Payer: Commercial Managed Care - HMO

## 2015-09-11 DIAGNOSIS — H903 Sensorineural hearing loss, bilateral: Secondary | ICD-10-CM | POA: Diagnosis not present

## 2015-09-20 DIAGNOSIS — Z794 Long term (current) use of insulin: Secondary | ICD-10-CM | POA: Diagnosis not present

## 2015-09-20 DIAGNOSIS — E1165 Type 2 diabetes mellitus with hyperglycemia: Secondary | ICD-10-CM | POA: Diagnosis not present

## 2015-09-20 DIAGNOSIS — E039 Hypothyroidism, unspecified: Secondary | ICD-10-CM | POA: Diagnosis not present

## 2015-09-26 ENCOUNTER — Other Ambulatory Visit: Payer: Self-pay | Admitting: Urology

## 2015-09-27 DIAGNOSIS — E785 Hyperlipidemia, unspecified: Secondary | ICD-10-CM | POA: Diagnosis not present

## 2015-09-27 DIAGNOSIS — I714 Abdominal aortic aneurysm, without rupture: Secondary | ICD-10-CM | POA: Diagnosis not present

## 2015-09-27 DIAGNOSIS — I6529 Occlusion and stenosis of unspecified carotid artery: Secondary | ICD-10-CM | POA: Diagnosis not present

## 2015-09-27 DIAGNOSIS — E669 Obesity, unspecified: Secondary | ICD-10-CM | POA: Diagnosis not present

## 2015-09-27 DIAGNOSIS — I6523 Occlusion and stenosis of bilateral carotid arteries: Secondary | ICD-10-CM | POA: Diagnosis not present

## 2015-09-28 ENCOUNTER — Telehealth: Payer: Self-pay | Admitting: Family Medicine

## 2015-09-28 ENCOUNTER — Encounter: Payer: Self-pay | Admitting: Family Medicine

## 2015-09-28 NOTE — Telephone Encounter (Signed)
Patient returned to clinic with cardiovascular [facility] dated 27 Sep 2015 (AAA 3.09x3.15, right CIA 1.72x1.69 and Lt CIA 1.74x1.72 no change from 09-04-2014, audiology Vania Rea 11 Sep 2015 noted 25dB HL with aids met DOT requirements), endocrinology notes Sheran Fava dated 21 Sep 2015 to complete DOT physical requirements.    Given 1 year medical certificate and patient told he must mail Diabetes Insulin Waiver paperwork to DOT-Federal Diabetes Exemption Program, [address], [place], [place] [address] and receive waiver prior to driving.  Patient verbalized understanding of information and had no further questions at this time.  Copies of referral results and updated DOT paperwork sent to medical records for scanning.

## 2015-10-16 ENCOUNTER — Ambulatory Visit: Payer: Commercial Managed Care - HMO | Admitting: Internal Medicine

## 2015-10-16 ENCOUNTER — Other Ambulatory Visit: Payer: Commercial Managed Care - HMO

## 2015-10-23 ENCOUNTER — Encounter: Payer: Self-pay | Admitting: Urology

## 2015-10-23 ENCOUNTER — Ambulatory Visit (INDEPENDENT_AMBULATORY_CARE_PROVIDER_SITE_OTHER): Payer: Commercial Managed Care - HMO | Admitting: Urology

## 2015-10-23 VITALS — BP 134/76 | HR 62 | Ht 68.0 in | Wt 199.9 lb

## 2015-10-23 DIAGNOSIS — Z8551 Personal history of malignant neoplasm of bladder: Secondary | ICD-10-CM | POA: Diagnosis not present

## 2015-10-23 DIAGNOSIS — E119 Type 2 diabetes mellitus without complications: Secondary | ICD-10-CM | POA: Insufficient documentation

## 2015-10-23 LAB — URINALYSIS, COMPLETE
Bilirubin, UA: NEGATIVE
Ketones, UA: NEGATIVE
Leukocytes, UA: NEGATIVE
NITRITE UA: NEGATIVE
PH UA: 5 (ref 5.0–7.5)
PROTEIN UA: NEGATIVE
Specific Gravity, UA: 1.025 (ref 1.005–1.030)
Urobilinogen, Ur: 1 mg/dL (ref 0.2–1.0)

## 2015-10-23 LAB — MICROSCOPIC EXAMINATION: BACTERIA UA: NONE SEEN

## 2015-10-23 MED ORDER — CIPROFLOXACIN HCL 500 MG PO TABS
500.0000 mg | ORAL_TABLET | Freq: Once | ORAL | Status: AC
  Administered 2015-10-23: 500 mg via ORAL

## 2015-10-23 MED ORDER — LIDOCAINE HCL 2 % EX GEL
1.0000 "application " | Freq: Once | CUTANEOUS | Status: AC
  Administered 2015-10-23: 1 via URETHRAL

## 2015-10-23 NOTE — Progress Notes (Signed)
10/23/2015 9:00 AM   Kenneth Curry [birth year] [id number]  Referring provider: Ezequiel Kayser, MD [facility] [place], [place] [address]  Chief Complaint  Patient presents with  . Cysto    HPI: Here for routine yearly surveillance. Patient's poorly compliant with physician visits. His diabetic continues to smoke. He is on Coumadin. No bleeding was encountered during the procedure today. A moderate caliber stricture was seen at the bulbous urethra and easily passed through with the flexible scope    PMH: Past Medical History  Diagnosis Date  . Abdominal aortic aneurysm ([place])   . Pulmonary nodules   . Thrombocytopenia ([place])   . Presence of IVC filter   . Elevated cholesterol   . Diabetes mellitus, type 2 ([place])   . Cellulitis   . History of primary bladder cancer   . Hematuria   . Urinary frequency   . Phimosis     Surgical History: Past Surgical History  Procedure Laterality Date  . Cholecystectomy    . Cochlear implant    . Appendectomy    . Abdominal aortic aneurysm repair      Home Medications:    Medication List       This list is accurate as of: 10/23/15  9:00 AM.  Always use your most recent med list.               ACCU-CHEK FASTCLIX LANCETS Misc     ACCU-CHEK NANO SMARTVIEW W/DEVICE Kit     ACCU-CHEK SMARTVIEW test strip  Generic drug:  glucose blood     Acetaminophen 500 MG coapsule  as needed.     Acetaminophen 500 MG coapsule     CALCIUM-D PO  Take by mouth daily.     glimepiride 4 MG tablet  Commonly known as:  AMARYL  Take 1 tablet (4 mg) in the morning and 1 tablet (4 mg) in the evening with food.     Insulin Glargine 100 UNIT/ML Solostar Pen  Commonly known as:  LANTUS  Inject into the skin.     levothyroxine 150 MCG tablet  Commonly known as:  SYNTHROID, LEVOTHROID  Take by mouth.     lovastatin 20 MG tablet  Commonly known as:  MEVACOR  Take by mouth.     OMEPRAZOLE PO  Take 20 mg by mouth  daily.     RELION PEN NEEDLES 29G X 12MM Misc  Generic drug:  Insulin Pen Needle     sitaGLIPtin 100 MG tablet  Commonly known as:  JANUVIA  Take by mouth.     warfarin 5 MG tablet  Commonly known as:  COUMADIN  2.5 Coumadin 5 mg pills (12.66m) on Saturdays and Wednesdays; and 2 Coumadin 5 mg pills (10 mg) the other 5 days of the week.        Allergies:  Allergies  Allergen Reactions  . Hydrocodone-Acetaminophen Other (See Comments)    Other reaction(s): Other (See Comments) intolerate intolerate  . Metformin Diarrhea  . Atorvastatin Rash    Family History: Family History  Problem Relation Age of Onset  . Lung cancer Brother   . Colon cancer Brother   . Cancer Sister     Renal    Social History:  reports that he has been smoking Cigarettes.  He has been smoking about 1.00 pack per day. He does not have any smokeless tobacco history on file. He reports that he does not drink alcohol. His drug history is not on file.  ROS: UROLOGY Frequent Urination?: No Hard to postpone urination?: No Burning/pain with urination?: No Get up at night to urinate?: No Leakage of urine?: No Urine stream starts and stops?: No Trouble starting stream?: No Do you have to strain to urinate?: No Blood in urine?: No Urinary tract infection?: No Sexually transmitted disease?: No Injury to kidneys or bladder?: No Painful intercourse?: No Weak stream?: No Erection problems?: No Penile pain?: No  Gastrointestinal Nausea?: No Vomiting?: No Indigestion/heartburn?: No Diarrhea?: No Constipation?: No  Constitutional Fever: No Night sweats?: No Weight loss?: No Fatigue?: No  Skin Skin rash/lesions?: No Itching?: No  Eyes Blurred vision?: No Double vision?: No  Ears/Nose/Throat Sore throat?: No Sinus problems?: No  Hematologic/Lymphatic Swollen glands?: No Easy bruising?: No  Cardiovascular Leg swelling?: No Chest pain?: No  Respiratory Cough?: No Shortness of  breath?: No  Endocrine Excessive thirst?: No  Musculoskeletal Back pain?: No Joint pain?: Yes  Neurological Headaches?: No Dizziness?: No  Psychologic Depression?: No Anxiety?: No  Physical Exam: BP 134/76 mmHg  Pulse 62  Ht 5' 8"  (1.727 m)  Wt 199 lb 14.4 oz (90.674 kg)  BMI 30.40 kg/m2  Constitutional:  Alert and oriented, No acute distress. HEENT: [place] AT, moist mucus membranes.  Trachea midline, no masses. Cardiovascular: No clubbing, cyanosis, or edema. Respiratory: Normal respiratory effort, no increased work of breathing. GI: Abdomen is soft, nontender, nondistended, no abdominal masses GU: No CVA tenderness.  Skin: No rashes, bruises or suspicious lesions. Lymph: No cervical or inguinal adenopathy. Neurologic: Grossly intact, no focal deficits, moving all 4 extremities. Psychiatric: Normal mood and affect.  Laboratory Data: Lab Results  Component Value Date   WBC 14.4* 04/24/2015   HGB 14.9 04/24/2015   HCT 43.5 04/24/2015   MCV 84.4 04/24/2015   PLT 95* 04/24/2015    Lab Results  Component Value Date   CREATININE 1.04 05/23/2014    No results found for: PSA  No results found for: TESTOSTERONE  No results found for: HGBA1C  Urinalysis    Component Value Date/Time   COLORURINE YELLOW 05/04/2014 [place] 05/04/2014 1214   LABSPEC <1.005* 09/03/2015 0915   LABSPEC 1.025 09/29/2014 1807   PHURINE 6.5 05/04/2014 1214   GLUCOSEU 250* 09/03/2015 0915   GLUCOSEU NEGATIVE 09/29/2014 1807   HGBUR TRACE* 09/03/2015 0915   HGBUR TRACE 09/29/2014 1807   BILIRUBINUR Negative 06/12/2015 0919   BILIRUBINUR NEGATIVE 05/04/2014 [place] 05/04/2014 [place] 09/03/2015 0915   PROTEINUR NEGATIVE 09/29/2014 1807   NITRITE Negative 06/12/2015 0919   NITRITE NEGATIVE 05/04/2014 1214   LEUKOCYTESUR Negative 06/12/2015 0919   LEUKOCYTESUR NEGATIVE 05/04/2014 1214    Pertinent Imaging: None  Assessment &  Plan:  No recurrence of bladder tumor seen today on cystoscopy seen in one year for routine cystoscopy for surveillance patient advised to stop smoking completely. Patient was not cooperative with that concept so he definitely should be carefully surveyed as he continues to smoke  1. History of bladder cancer Ta  Non invaxive 05/17/14  Left side near ureteral orifice - Urinalysis, Complete - ciprofloxacin (CIPRO) tablet 500 mg; Take 1 tablet (500 mg total) by mouth once. - lidocaine (XYLOCAINE) 2 % jelly 1 application; Place 1 application into the urethra once.   No Follow-up on file.  Collier Flowers, [facility] [address] [place], [place] [address] [phone]

## 2015-10-23 NOTE — Progress Notes (Signed)
    Cystoscopy Procedure Note  Patient identification was confirmed, informed consent was obtained, and patient was prepped using Betadine solution.  Lidocaine jelly was administered per urethral meatus.    Preoperative abx where received prior to procedure.     Pre-Procedure: - Inspection reveals a normal caliber ureteral meatus.  Procedure: The flexible cystoscope was introduced without difficulty - 1 large caliber stricture  is present. -  prostate nonobstructive slightly enlarged -  bladder neck - Bilateral ureteral orifices identified - Bladder mucosa  reveals no ulcers, tumors, or lesions - No bladder stones - No trabeculation  Retroflexion shows no recurrent tumor   Post-Procedure: - Patient tolerated the procedure well

## 2015-10-30 DIAGNOSIS — E119 Type 2 diabetes mellitus without complications: Secondary | ICD-10-CM | POA: Diagnosis not present

## 2015-10-31 ENCOUNTER — Ambulatory Visit: Payer: Commercial Managed Care - HMO | Admitting: Hematology and Oncology

## 2015-10-31 ENCOUNTER — Other Ambulatory Visit: Payer: Commercial Managed Care - HMO

## 2015-11-06 ENCOUNTER — Other Ambulatory Visit: Payer: Self-pay | Admitting: Hematology and Oncology

## 2015-11-06 DIAGNOSIS — D494 Neoplasm of unspecified behavior of bladder: Secondary | ICD-10-CM

## 2015-11-06 DIAGNOSIS — C9110 Chronic lymphocytic leukemia of B-cell type not having achieved remission: Secondary | ICD-10-CM

## 2015-11-06 DIAGNOSIS — I82402 Acute embolism and thrombosis of unspecified deep veins of left lower extremity: Secondary | ICD-10-CM

## 2015-11-07 ENCOUNTER — Other Ambulatory Visit: Payer: Self-pay | Admitting: *Deleted

## 2015-11-07 ENCOUNTER — Ambulatory Visit: Payer: Commercial Managed Care - HMO | Admitting: Hematology and Oncology

## 2015-11-07 ENCOUNTER — Other Ambulatory Visit: Payer: Commercial Managed Care - HMO

## 2015-11-16 DIAGNOSIS — D696 Thrombocytopenia, unspecified: Secondary | ICD-10-CM | POA: Diagnosis not present

## 2015-11-16 DIAGNOSIS — Z7901 Long term (current) use of anticoagulants: Secondary | ICD-10-CM | POA: Diagnosis not present

## 2015-11-16 DIAGNOSIS — J0100 Acute maxillary sinusitis, unspecified: Secondary | ICD-10-CM | POA: Diagnosis not present

## 2015-11-21 DIAGNOSIS — Z79899 Other long term (current) drug therapy: Secondary | ICD-10-CM | POA: Diagnosis not present

## 2015-11-21 DIAGNOSIS — K219 Gastro-esophageal reflux disease without esophagitis: Secondary | ICD-10-CM | POA: Diagnosis not present

## 2015-11-21 DIAGNOSIS — E039 Hypothyroidism, unspecified: Secondary | ICD-10-CM | POA: Diagnosis not present

## 2015-11-21 DIAGNOSIS — Z7901 Long term (current) use of anticoagulants: Secondary | ICD-10-CM | POA: Diagnosis not present

## 2015-11-21 DIAGNOSIS — Z23 Encounter for immunization: Secondary | ICD-10-CM | POA: Diagnosis not present

## 2015-11-21 DIAGNOSIS — E782 Mixed hyperlipidemia: Secondary | ICD-10-CM | POA: Diagnosis not present

## 2015-11-21 DIAGNOSIS — E559 Vitamin D deficiency, unspecified: Secondary | ICD-10-CM | POA: Diagnosis not present

## 2015-11-21 DIAGNOSIS — E119 Type 2 diabetes mellitus without complications: Secondary | ICD-10-CM | POA: Diagnosis not present

## 2015-11-23 DIAGNOSIS — Z794 Long term (current) use of insulin: Secondary | ICD-10-CM | POA: Diagnosis not present

## 2015-11-23 DIAGNOSIS — E119 Type 2 diabetes mellitus without complications: Secondary | ICD-10-CM | POA: Diagnosis not present

## 2015-11-23 DIAGNOSIS — Z79899 Other long term (current) drug therapy: Secondary | ICD-10-CM | POA: Diagnosis not present

## 2015-11-28 ENCOUNTER — Ambulatory Visit: Payer: Commercial Managed Care - HMO | Admitting: Hematology and Oncology

## 2015-11-28 ENCOUNTER — Inpatient Hospital Stay: Payer: Commercial Managed Care - HMO | Attending: Hematology and Oncology

## 2015-11-28 DIAGNOSIS — C9110 Chronic lymphocytic leukemia of B-cell type not having achieved remission: Secondary | ICD-10-CM

## 2015-11-28 DIAGNOSIS — Z8551 Personal history of malignant neoplasm of bladder: Secondary | ICD-10-CM | POA: Insufficient documentation

## 2015-11-28 DIAGNOSIS — I82402 Acute embolism and thrombosis of unspecified deep veins of left lower extremity: Secondary | ICD-10-CM

## 2015-11-28 LAB — CBC WITH DIFFERENTIAL/PLATELET
Basophils Absolute: 0.1 10*3/uL (ref 0–0.1)
Basophils Relative: 1 %
Eosinophils Absolute: 0.1 10*3/uL (ref 0–0.7)
Eosinophils Relative: 1 %
HCT: 43.3 % (ref 40.0–52.0)
Hemoglobin: 14.3 g/dL (ref 13.0–18.0)
Lymphocytes Relative: 63 %
Lymphs Abs: 10.6 10*3/uL — ABNORMAL HIGH (ref 1.0–3.6)
MCH: 28.1 pg (ref 26.0–34.0)
MCHC: 33.1 g/dL (ref 32.0–36.0)
MCV: 84.8 fL (ref 80.0–100.0)
Monocytes Absolute: 0.6 10*3/uL (ref 0.2–1.0)
Monocytes Relative: 4 %
Neutro Abs: 5.2 10*3/uL (ref 1.4–6.5)
Neutrophils Relative %: 31 %
Platelets: 109 10*3/uL — ABNORMAL LOW (ref 150–440)
RBC: 5.11 MIL/uL (ref 4.40–5.90)
RDW: 14.3 % (ref 11.5–14.5)
WBC: 16.7 10*3/uL — ABNORMAL HIGH (ref 3.8–10.6)

## 2015-11-28 LAB — COMPREHENSIVE METABOLIC PANEL
ALT: 13 U/L — ABNORMAL LOW (ref 17–63)
AST: 17 U/L (ref 15–41)
Albumin: 4.2 g/dL (ref 3.5–5.0)
Alkaline Phosphatase: 56 U/L (ref 38–126)
Anion gap: 8 (ref 5–15)
BUN: 22 mg/dL — ABNORMAL HIGH (ref 6–20)
CO2: 27 mmol/L (ref 22–32)
Calcium: 9.3 mg/dL (ref 8.9–10.3)
Chloride: 103 mmol/L (ref 101–111)
Creatinine, Ser: 0.95 mg/dL (ref 0.61–1.24)
GFR calc Af Amer: >60 mL/min (ref >60)
GFR calc non Af Amer: >60 mL/min (ref >60)
Glucose, Bld: 234 mg/dL — ABNORMAL HIGH (ref 65–99)
Potassium: 4.4 mmol/L (ref 3.5–5.1)
Sodium: 138 mmol/L (ref 135–145)
Total Bilirubin: 1 mg/dL (ref 0.3–1.2)
Total Protein: 7.1 g/dL (ref 6.5–8.1)

## 2015-11-28 LAB — PROTIME-INR
INR: 1.58 — ABNORMAL HIGH (ref 0.00–1.49)
Prothrombin Time: 18.9 seconds — ABNORMAL HIGH (ref 11.6–15.2)

## 2015-11-29 ENCOUNTER — Telehealth: Payer: Self-pay

## 2015-11-29 NOTE — Telephone Encounter (Signed)
Called pt per MD to see who is monitoring PT/INR no answer, no voicemail to leave message.  Informed MD

## 2015-12-09 ENCOUNTER — Ambulatory Visit (INDEPENDENT_AMBULATORY_CARE_PROVIDER_SITE_OTHER): Payer: Commercial Managed Care - HMO

## 2015-12-09 ENCOUNTER — Encounter: Payer: Self-pay | Admitting: Gynecology

## 2015-12-09 ENCOUNTER — Ambulatory Visit
Admission: EM | Admit: 2015-12-09 | Discharge: 2015-12-09 | Disposition: A | Discharge status: Home / Self Care | Payer: Commercial Managed Care - HMO | Attending: Family Medicine | Admitting: Family Medicine

## 2015-12-09 DIAGNOSIS — K8689 Other specified diseases of pancreas: Secondary | ICD-10-CM | POA: Diagnosis not present

## 2015-12-09 DIAGNOSIS — N390 Urinary tract infection, site not specified: Secondary | ICD-10-CM | POA: Diagnosis not present

## 2015-12-09 DIAGNOSIS — R52 Pain, unspecified: Secondary | ICD-10-CM

## 2015-12-09 LAB — URINALYSIS COMPLETE WITH MICROSCOPIC (ARMC ONLY)
BILIRUBIN URINE: NEGATIVE
GLUCOSE, UA: 100 mg/dL — AB
Ketones, ur: NEGATIVE mg/dL
NITRITE: POSITIVE — AB
Protein, ur: 30 mg/dL — AB
SPECIFIC GRAVITY, URINE: 1.02 (ref 1.005–1.030)
pH: 5.5 (ref 5.0–8.0)

## 2015-12-09 LAB — CBC WITH DIFFERENTIAL/PLATELET
Basophils Absolute: 0.2 10*3/uL — ABNORMAL HIGH (ref 0–0.1)
Basophils Relative: 1 %
EOS ABS: 0.2 10*3/uL (ref 0–0.7)
Eosinophils Relative: 1 %
HEMATOCRIT: 43.3 % (ref 40.0–52.0)
HEMOGLOBIN: 14.4 g/dL (ref 13.0–18.0)
LYMPHS ABS: 9 10*3/uL — AB (ref 1.0–3.6)
MCH: 28.2 pg (ref 26.0–34.0)
MCHC: 33.2 g/dL (ref 32.0–36.0)
MCV: 85 fL (ref 80.0–100.0)
Monocytes Absolute: 0.8 10*3/uL (ref 0.2–1.0)
Neutro Abs: 9.2 10*3/uL — ABNORMAL HIGH (ref 1.4–6.5)
Platelets: 89 10*3/uL — ABNORMAL LOW (ref 150–440)
RBC: 5.09 MIL/uL (ref 4.40–5.90)
RDW: 14.4 % (ref 11.5–14.5)
WBC: 19.4 10*3/uL — ABNORMAL HIGH (ref 3.8–10.6)

## 2015-12-09 LAB — BASIC METABOLIC PANEL
Anion gap: 7 (ref 5–15)
BUN: 20 mg/dL (ref 6–20)
CHLORIDE: 103 mmol/L (ref 101–111)
CO2: 27 mmol/L (ref 22–32)
CREATININE: 1.04 mg/dL (ref 0.61–1.24)
Calcium: 9.5 mg/dL (ref 8.9–10.3)
GFR calc Af Amer: >60 mL/min (ref >60)
GFR calc non Af Amer: >60 mL/min (ref >60)
GLUCOSE: 173 mg/dL — AB (ref 65–99)
POTASSIUM: 4.2 mmol/L (ref 3.5–5.1)
SODIUM: 137 mmol/L (ref 135–145)

## 2015-12-09 MED ORDER — CIPROFLOXACIN HCL 500 MG PO TABS: 500.0000 mg | ORAL_TABLET | Freq: Two times a day (BID) | ORAL | Status: DC

## 2015-12-09 NOTE — Discharge Instructions (Signed)
Take medication as prescribed. Rest. Eat and drink regularly.  Follow-up very closely with Your primary doctor Dr. Dorthula Perfect. Follow-up with your primary doctor in 2-3 days.  As discussed follow-up with gastroenterology. See above to call to schedule follow-up for this week. This is the follow-up regarding the finding on the CT scan.  Return to urgent care proceed directly to the ER for fever, inability to eat or drink, abdominal pain, difficulty urinating, new or worsening concerns.   Urinary Tract Infection Urinary tract infections (UTIs) can develop anywhere along your urinary tract. Your urinary tract is your body's drainage system for removing wastes and extra water. Your urinary tract includes two kidneys, two ureters, a bladder, and a urethra. Your kidneys are a pair of bean-shaped organs. Each kidney is about the size of your fist. They are located below your ribs, one on each side of your spine. CAUSES Infections are caused by microbes, which are microscopic organisms, including fungi, viruses, and bacteria. These organisms are so small that they can only be seen through a microscope. Bacteria are the microbes that most commonly cause UTIs. SYMPTOMS  Symptoms of UTIs may vary by age and gender of the patient and by the location of the infection. Symptoms in young women typically include a frequent and intense urge to urinate and a painful, burning feeling in the bladder or urethra during urination. Older women and men are more likely to be tired, shaky, and weak and have muscle aches and abdominal pain. A fever may mean the infection is in your kidneys. Other symptoms of a kidney infection include pain in your back or sides below the ribs, nausea, and vomiting. DIAGNOSIS To diagnose a UTI, your caregiver will ask you about your symptoms. Your caregiver will also ask you to provide a urine sample. The urine sample will be tested for bacteria and white blood cells. White blood cells are made by  your body to help fight infection. TREATMENT  Typically, UTIs can be treated with medication. Because most UTIs are caused by a bacterial infection, they usually can be treated with the use of antibiotics. The choice of antibiotic and length of treatment depend on your symptoms and the type of bacteria causing your infection. HOME CARE INSTRUCTIONS  If you were prescribed antibiotics, take them exactly as your caregiver instructs you. Finish the medication even if you feel better after you have only taken some of the medication.  Drink enough water and fluids to keep your urine clear or pale yellow.  Avoid caffeine, tea, and carbonated beverages. They tend to irritate your bladder.  Empty your bladder often. Avoid holding urine for long periods of time.  Empty your bladder before and after sexual intercourse.  After a bowel movement, women should cleanse from front to back. Use each tissue only once. SEEK MEDICAL CARE IF:   You have back pain.  You develop a fever.  Your symptoms do not begin to resolve within 3 days. SEEK IMMEDIATE MEDICAL CARE IF:   You have severe back pain or lower abdominal pain.  You develop chills.  You have nausea or vomiting.  You have continued burning or discomfort with urination. MAKE SURE YOU:   Understand these instructions.  Will watch your condition.  Will get help right away if you are not doing well or get worse.   This information is not intended to replace advice given to you by your health care provider. Make sure you discuss any questions you have with your health  care provider.   Document Released: 09/03/2005 Document Revised: 08/15/2015 Document Reviewed: 01/02/2012 Elsevier Interactive Patient Education [employer].

## 2015-12-09 NOTE — ED Provider Notes (Signed)
[facility]  ____________________________________________  Time seen: Approximately 1:29 PM  I have reviewed the triage vital signs and the nursing notes.   HISTORY  Chief Complaint Urinary Tract Infection    HPI Kenneth Curry is a 82 y.o. male presents with spouse at bedside for the complaints of 2 days of urinary frequency, urinary urgency as well as some burning with urination. Patient states that he is felt some pressure in mid lower abdomen. States yesterday he also had some intermittent left side pain that would come and go, denies current left side pain.reports is continued to eat and drink well, with normal appetite.  States current suprapubic discomfort is 2 out of 10 and described more as a pressure than a pain. Denies pain radiation. Denies other abdominal pain. Denies fever, nausea, vomiting, diarrhea, low back pain, chest pain, shortness of breath, dizziness, weakness or other complaints. Denials penile pain or discharge, denies testicular pain or swelling.denies rectal pain. Denies abnormal bleeding. Denies blood in urine, blood in stool, dark-colored stool. Reports last bowel movement yesterday and described as normal.  Denies history of urinary tract infections. He does report that his history of bladder cancer and has follow with Dr. Elnoria Howard urology. Patient reports last cystoscopy was in November and reports that he received good results.  PCP: Dr. Dorthula Perfect Oncology: Dr. Cynda Acres Endocrinology: Charlane Ferretti   Past Medical History  Diagnosis Date  . Abdominal aortic aneurysm ([place])   . Pulmonary nodules   . Thrombocytopenia ([place])   . Presence of IVC filter   . Elevated cholesterol   . Diabetes mellitus, type 2 ([place])   . Cellulitis   . History of primary bladder cancer   . Hematuria   . Urinary frequency   . Phimosis   leukocytosis-chronic  Patient Active Problem List   Diagnosis Date Noted  . Type 2 diabetes mellitus ([place]) 10/23/2015  . Diabetes mellitus,  type 2 ([place]) 06/12/2015  . Acid reflux 06/12/2015  . HLD (hyperlipidemia) 06/12/2015  . Adult hypothyroidism 06/12/2015  . Avitaminosis D 06/12/2015  . History of bladder cancer 06/12/2015  . Long term current use of anticoagulant 05/18/2015  . Polypharmacy 05/18/2015  . Other long term (current) drug therapy 05/18/2015  . Chronic lymphatic leukemia ([place]) 05/14/2014  . Bilateral hearing loss 05/14/2014  . Chronic lymphocytic leukemia ([place]) 05/14/2014  . Bladder tumor 05/08/2014  . Deep vein thrombosis ([place]) 10/21/2013  . H/O abdominal aortic aneurysm repair 10/21/2013  . Change in blood platelet count 10/21/2013  . Deep vein thrombosis (DVT) ([place]) 10/21/2013  . History of abdominal aortic aneurysm (AAA) repair 10/21/2013  . Thrombocytopenia ([place]) 10/21/2013    Past Surgical History  Procedure Laterality Date  . Cholecystectomy    . Cochlear implant    . Appendectomy    . Abdominal aortic aneurysm repair      Current Outpatient Rx  Name  Route  Sig  Dispense  Refill  . ACCU-CHEK FASTCLIX LANCETS MISC               . ACCU-CHEK SMARTVIEW test strip                 Dispense as written.   . Acetaminophen 500 MG coapsule      as needed.          . Acetaminophen 500 MG coapsule               . Blood Glucose Monitoring Suppl (ACCU-CHEK NANO SMARTVIEW) W/DEVICE KIT               .  Calcium Carbonate-Vitamin D (CALCIUM-D PO)   Oral   Take by mouth daily.         Marland Kitchen glimepiride (AMARYL) 4 MG tablet      Take 1 tablet (4 mg) in the morning and 1 tablet (4 mg) in the evening with food.         . Insulin Glargine (LANTUS) 100 UNIT/ML Solostar Pen   Subcutaneous   Inject into the skin.         Marland Kitchen levothyroxine (SYNTHROID, LEVOTHROID) 150 MCG tablet   Oral   Take by mouth.         . lovastatin (MEVACOR) 20 MG tablet   Oral   Take by mouth.         . OMEPRAZOLE PO   Oral   Take 20 mg by mouth daily.         Marland Kitchen RELION PEN NEEDLES 29G X 12MM  MISC                 Dispense as written.   . sitaGLIPtin (JANUVIA) 100 MG tablet   Oral   Take by mouth.         . warfarin (COUMADIN) 5 MG tablet      2.5 Coumadin 5 mg pills (12.34m) on Saturdays and Wednesdays; and 2 Coumadin 5 mg pills (10 mg) the other 5 days of the week.   165 tablet   5     Allergies Hydrocodone-acetaminophen; Metformin; and Atorvastatin  Family History  Problem Relation Age of Onset  . Lung cancer Brother   . Colon cancer Brother   . Cancer Sister     Renal    Social History Social History  Substance Use Topics  . Smoking status: Current Every Day Smoker -- 1.00 packs/day    Types: Cigarettes  . Smokeless tobacco: None  . Alcohol Use: No    Review of Systems Constitutional: No fever/chills Eyes: No visual changes. ENT: No sore throat. Cardiovascular: Denies chest pain. Respiratory: Denies shortness of breath. Gastrointestinal: No abdominal pain.  No nausea, no vomiting.  No diarrhea.  No constipation. Genitourinary: positivefor dysuria. Musculoskeletal: Negative for back pain. Skin: Negative for rash. Neurological: Negative for headaches, focal weakness or numbness.  10-point ROS otherwise negative.  ____________________________________________   PHYSICAL EXAM:  VITAL SIGNS: ED Triage Vitals  Enc Vitals Group     BP 12/09/15 1149 116/70 mmHg     Pulse Rate 12/09/15 1149 60     Resp 12/09/15 1149 16     Temp 12/09/15 1149 97.9 F (36.6 C)     Temp Source 12/09/15 1149 Oral     SpO2 12/09/15 1149 98 %     Weight 12/09/15 1149 200 lb (90.719 kg)     Height 12/09/15 1149 5' 8"  (1.727 m)     Head Cir --      Peak Flow --      Pain Score 12/09/15 1148 4     Pain Loc --      Pain Edu? --      Excl. in [place] --     Constitutional: Alert and oriented. Well appearing and in no acute distress. Eyes: Conjunctivae are normal. PERRL. EOMI. Head: Atraumatic.  Nose: No congestion/rhinnorhea.  Mouth/Throat: Mucous membranes  are moist.  Oropharynx non-erythematous. Neck: No stridor.  No cervical spine tenderness to palpation. Hematological/Lymphatic/Immunilogical: No cervical lymphadenopathy. Cardiovascular: Normal rate, regular rhythm. Grossly normal heart sounds.  Good peripheral circulation. Respiratory: Normal respiratory effort.  No  retractions. Lungs CTAB. Gastrointestinal: minimal suprapubic discomfort, otherwise abdomen Soft and nontender. Obese abdomen, no noted distention. Normal Bowel sounds. No CVA tenderness. Male: exam completed with Annamary Carolin RN at bedside Rectal/prostate: nontender, prostate soft and nontender. Penile and testicular exam: no lesions, rash or discharge. Uncircumcised. No testicular tenderness or swelling. No erythema. Musculoskeletal: No lower or upper extremity tenderness nor edema. Bilateral pedal pulses equal and easily palpated. No cervical, thoracic or lumbar tenderness to palpation. Neurologic:  Normal speech and language. No gross focal neurologic deficits are appreciated. No gait instability. Skin:  Skin is warm, dry and intact. No rash noted. Psychiatric: Mood and affect are normal. Speech and behavior are normal.  ____________________________________________   LABS (all labs ordered are listed, but only abnormal results are displayed)  Labs Reviewed  URINALYSIS COMPLETEWITH MICROSCOPIC ([facility] ONLY) - Abnormal; Notable for the following:    APPearance CLOUDY (*)    Glucose, UA 100 (*)    Hgb urine dipstick 3+ (*)    Protein, ur 30 (*)    Nitrite POSITIVE (*)    Leukocytes, UA 3+ (*)    Bacteria, UA MANY (*)    Squamous Epithelial / LPF 0-5 (*)    All other components within normal limits  CBC WITH DIFFERENTIAL/PLATELET - Abnormal; Notable for the following:    WBC 19.4 (*)    Platelets 89 (*)    Neutro Abs 9.2 (*)    Lymphs Abs 9.0 (*)    Basophils Absolute 0.2 (*)    All other components within normal limits  BASIC METABOLIC PANEL - Abnormal; Notable for the  following:    Glucose, Bld 173 (*)    All other components within normal limits  URINE CULTURE    RADIOLOGY  EXAM: CT ABDOMEN AND PELVIS WITHOUT CONTRAST  TECHNIQUE: Multidetector CT imaging of the abdomen and pelvis was performed following the standard protocol without IV contrast.  COMPARISON: 11/12/2009 abdominal and pelvic CT, which we have limited images. 12/21/2010 radiographs and 10/24/2009 ultrasound  FINDINGS: Please note that parenchymal abnormalities may be missed without intravenous contrast.  Lower chest: Cardiomegaly noted.  Hepatobiliary: The liver is unremarkable. The patient is status post cholecystectomy. There is no evidence of CBD or intrahepatic biliary dilatation.  Pancreas: Pancreatic ductal dilatation is identified within the body and tail noted with pancreatic atrophy and appears new since 2010. There is a 3.2 x 2.7 cm cystic structure adjacent adjacent to the pancreatic head, as well as a 1 x 1.6 cm cystic structure/mass along the medial uncinate process.  Spleen: Splenomegaly is again identified  Adrenals/Urinary Tract: Mild bilateral renal atrophy noted. A right renal cyst is present. The adrenal glands are unremarkable. The bladder is not well-distended and mildly thick-walled.  Stomach/Bowel: Colonic diverticulosis noted without evidence of diverticulitis. A small hiatal hernia is identified. There is no evidence of bowel obstruction or definite bowel wall thickening.  Vascular/Lymphatic: Aortic stent graft noted. An IVC filter is present. No enlarged lymph nodes are identified.  Reproductive: Prostate unremarkable  Other: No free fluid, hematoma, pneumoperitoneum or abscess noted.  Musculoskeletal: No acute or suspicious abnormalities identified. Degenerative changes within the lumbar spine and hips, left-greater-than-right noted.  IMPRESSION: New pancreatic ductal dilatation with cystic structures along the pancreatic  head/uncinate process. An obstructing pancreatic mass or stricture is not excluded and GI consultation is recommended. 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  Cardiomegaly, abdominal aortic stent graft and small hiatal hernia.   Electronically Signed By: Margarette Canada M.D. On:  12/09/2015 14:34 ____________________________________________  INITIAL IMPRESSION / ASSESSMENT AND PLAN / ED COURSE  Pertinent labs & imaging results that were available during my care of the patient were reviewed by me and considered in my medical decision making (see chart for details).  Very well-appearing patient. Patient with multiple chronic comorbidities and presents for the complaints of 2 days of urinary urgency, urinary frequency and dysuria. Patient also reports some intermittent left flank pain but denies current. Denies fever, nausea, vomiting, diarrhea or other complaints.urinalysis positive for many bacteria, 3+ leukocytes, nitrite positive, cloudy appearance with protein and hemoglobin. Will culture urine.will also evaluate CBC and BMPas well as CT renal to evaluate for any left kidney stone.  Labs reviewed. WBC count 19.4, recent labs reviewed via Epic and reveals chronically elevated the WBC count. BMP unremarkable.CT abdomen reviewed.  Per radiology Ct renal: New pancreatic ductal dilatation with cystic structures along the pancreatic head/uncinate process. An obstructing pancreatic mass or stricture is not excluded and GI consultation is recommended. 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   1450: Discussed CT findings with GI Dr Rayann Heman who also reviewed patient, and recommends no further testing done at this time in Urgent care as likely incidental finding, but for patient to have outpatient GI follow up this week. Dr Rayann Heman states patient will likely need a endoscopic ultrasound.  Discussed in detail with patient regarding CT abdomen results and need to  follow up outpatient with GI. Dr. Rayann Heman GI information given to patient. Patient to call tomorrow to schedule.patient with urinary tract infection and will treat with oral Cipro.recommended for patient to follow-up very closely with his primary care physician this week. Follow-up with Dr. Dorthula Perfect PCP in 2-3 days.  Discussed follow up with Primary care physician this week. Discussed follow up and return parameters or proceed to ER for weakness, fever, abdominal pain, back pain, difficulty urinating, inability to eat or drink, no resolution or any worsening concerns. Patient verbalized understanding and agreed to plan.   ____________________________________________   FINAL CLINICAL IMPRESSION(S) / ED DIAGNOSES  Final diagnoses:  Pain  UTI (lower urinary tract infection)       Marylene Land, NP 12/09/15 [place], NP 12/09/15 1612

## 2015-12-09 NOTE — ED Notes (Signed)
Patient c/o painful urination/ burning sensation/ and lower back / lower abdomen pain x 2 days.

## 2015-12-12 ENCOUNTER — Telehealth: Payer: Self-pay | Admitting: *Deleted

## 2015-12-12 LAB — URINE CULTURE

## 2015-12-12 NOTE — ED Notes (Signed)
Called and informed patient's wife that his urine culture did come back positive for bacteria. Wife states that the patient is presently taking prescribed antibiotic and does have a follow up appointment with his PCP.

## 2015-12-14 DIAGNOSIS — R933 Abnormal findings on diagnostic imaging of other parts of digestive tract: Secondary | ICD-10-CM | POA: Diagnosis not present

## 2015-12-19 ENCOUNTER — Inpatient Hospital Stay: Payer: Commercial Managed Care - HMO | Attending: Hematology and Oncology | Admitting: Hematology and Oncology

## 2015-12-19 ENCOUNTER — Inpatient Hospital Stay: Payer: Commercial Managed Care - HMO

## 2015-12-19 ENCOUNTER — Telehealth: Payer: Self-pay

## 2015-12-19 VITALS — BP 112/78 | HR 71 | Temp 97.2°F | Resp 18 | Ht 68.0 in | Wt 204.4 lb

## 2015-12-19 DIAGNOSIS — D696 Thrombocytopenia, unspecified: Secondary | ICD-10-CM | POA: Diagnosis not present

## 2015-12-19 DIAGNOSIS — Z8551 Personal history of malignant neoplasm of bladder: Secondary | ICD-10-CM | POA: Diagnosis not present

## 2015-12-19 DIAGNOSIS — Z794 Long term (current) use of insulin: Secondary | ICD-10-CM | POA: Insufficient documentation

## 2015-12-19 DIAGNOSIS — I714 Abdominal aortic aneurysm, without rupture: Secondary | ICD-10-CM | POA: Diagnosis not present

## 2015-12-19 DIAGNOSIS — Z7984 Long term (current) use of oral hypoglycemic drugs: Secondary | ICD-10-CM | POA: Diagnosis not present

## 2015-12-19 DIAGNOSIS — Z8051 Family history of malignant neoplasm of kidney: Secondary | ICD-10-CM | POA: Diagnosis not present

## 2015-12-19 DIAGNOSIS — Z86711 Personal history of pulmonary embolism: Secondary | ICD-10-CM | POA: Diagnosis not present

## 2015-12-19 DIAGNOSIS — Z800 Family history of malignant neoplasm of digestive organs: Secondary | ICD-10-CM

## 2015-12-19 DIAGNOSIS — R42 Dizziness and giddiness: Secondary | ICD-10-CM | POA: Insufficient documentation

## 2015-12-19 DIAGNOSIS — Z808 Family history of malignant neoplasm of other organs or systems: Secondary | ICD-10-CM | POA: Diagnosis not present

## 2015-12-19 DIAGNOSIS — N471 Phimosis: Secondary | ICD-10-CM | POA: Insufficient documentation

## 2015-12-19 DIAGNOSIS — R161 Splenomegaly, not elsewhere classified: Secondary | ICD-10-CM | POA: Diagnosis not present

## 2015-12-19 DIAGNOSIS — Z79899 Other long term (current) drug therapy: Secondary | ICD-10-CM | POA: Insufficient documentation

## 2015-12-19 DIAGNOSIS — I82532 Chronic embolism and thrombosis of left popliteal vein: Secondary | ICD-10-CM

## 2015-12-19 DIAGNOSIS — E7800 Pure hypercholesterolemia, unspecified: Secondary | ICD-10-CM | POA: Insufficient documentation

## 2015-12-19 DIAGNOSIS — F17210 Nicotine dependence, cigarettes, uncomplicated: Secondary | ICD-10-CM | POA: Diagnosis not present

## 2015-12-19 DIAGNOSIS — Z8701 Personal history of pneumonia (recurrent): Secondary | ICD-10-CM | POA: Insufficient documentation

## 2015-12-19 DIAGNOSIS — C9110 Chronic lymphocytic leukemia of B-cell type not having achieved remission: Secondary | ICD-10-CM | POA: Diagnosis not present

## 2015-12-19 DIAGNOSIS — K862 Cyst of pancreas: Secondary | ICD-10-CM | POA: Insufficient documentation

## 2015-12-19 DIAGNOSIS — R918 Other nonspecific abnormal finding of lung field: Secondary | ICD-10-CM | POA: Diagnosis not present

## 2015-12-19 DIAGNOSIS — K8689 Other specified diseases of pancreas: Secondary | ICD-10-CM | POA: Insufficient documentation

## 2015-12-19 DIAGNOSIS — Z801 Family history of malignant neoplasm of trachea, bronchus and lung: Secondary | ICD-10-CM | POA: Diagnosis not present

## 2015-12-19 DIAGNOSIS — R001 Bradycardia, unspecified: Secondary | ICD-10-CM | POA: Insufficient documentation

## 2015-12-19 DIAGNOSIS — E119 Type 2 diabetes mellitus without complications: Secondary | ICD-10-CM | POA: Diagnosis not present

## 2015-12-19 DIAGNOSIS — Z86718 Personal history of other venous thrombosis and embolism: Secondary | ICD-10-CM | POA: Insufficient documentation

## 2015-12-19 DIAGNOSIS — Z7901 Long term (current) use of anticoagulants: Secondary | ICD-10-CM | POA: Insufficient documentation

## 2015-12-19 DIAGNOSIS — I82402 Acute embolism and thrombosis of unspecified deep veins of left lower extremity: Secondary | ICD-10-CM

## 2015-12-19 LAB — CBC WITH DIFFERENTIAL/PLATELET
Basophils Absolute: 0.1 10*3/uL (ref 0–0.1)
Basophils Relative: 1 %
Eosinophils Absolute: 0.2 10*3/uL (ref 0–0.7)
Eosinophils Relative: 1 %
HCT: 45.1 % (ref 40.0–52.0)
Hemoglobin: 14.8 g/dL (ref 13.0–18.0)
Lymphocytes Relative: 58 %
Lymphs Abs: 9.1 10*3/uL — ABNORMAL HIGH (ref 1.0–3.6)
MCH: 27.9 pg (ref 26.0–34.0)
MCHC: 32.9 g/dL (ref 32.0–36.0)
MCV: 84.7 fL (ref 80.0–100.0)
Monocytes Absolute: 0.5 10*3/uL (ref 0.2–1.0)
Monocytes Relative: 3 %
Neutro Abs: 5.9 10*3/uL (ref 1.4–6.5)
Neutrophils Relative %: 37 %
Platelets: 95 10*3/uL — ABNORMAL LOW (ref 150–440)
RBC: 5.32 MIL/uL (ref 4.40–5.90)
RDW: 14.5 % (ref 11.5–14.5)
WBC: 15.9 10*3/uL — ABNORMAL HIGH (ref 3.8–10.6)

## 2015-12-19 LAB — PROTIME-INR
INR: 1.8 — ABNORMAL HIGH (ref 0.00–1.49)
Prothrombin Time: 20.8 seconds — ABNORMAL HIGH (ref 11.6–15.2)

## 2015-12-19 NOTE — Telephone Encounter (Signed)
Called pt per md.  Pt's current Coumadin dose: 10 mg a day x 7 days a week   New dose: 12.5 mg 2 days a week spread out (Wed and Saturday) and 10 mg the other 5 days.   Check INR in 1 week.   Spoke with wife she and pt verbalized an understanding and wrote it down.  No other concerns noted.  Advised pt of appt time also

## 2015-12-19 NOTE — Progress Notes (Signed)
[facility] day:  12/19/2015  Chief Complaint: Kenneth Curry is a 82 y.o. male with CLL, a history of bladder tumor, and left lower extremity DVT who is seen for reassessment.  HPI:   The patient was diagnosed with CLL in 1994 or 1996.  He presented with an elevated white blood cell count.  Notes from Dr. Carollee Leitz indicate that he has had splenomegaly and chronic thrombocytopenia (100,000-120,000).  He had 2 bone marrow biopsies (one at [facility] with Dr Otelia Limes).  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  No documentation is available except for Dr. Marylene Land notes.  He has not required treatment for his CLL.  He has had no B symptoms, adenopathy, bruising or bleeding.  He has had no recurrent infections.  He states that he had a UTI on New Year day.  His last dose of antibiotics is today.  He developed hematuria in 2015.  He underwent TURBT for a superficial bladder cancer in 05/2014.  He has been followed by Dr Elnoria Howard, urologist.  His last cystoscopy was negative on 10/23/2015.  He has another follow-up with urology in 09/2016.  He has a history of left DVT "years ago".  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  He was diagnosed with a DVT.  He was treated with hot compresses.  He notes "another spell with it" which spontaneously improved (pain and swelling).  While in [place], [place], in 08/2009, he developed "pain in my back".  He was diagnosed locally with "left sided pneumonia".  He was discharged and represented to [facility] where he was diagnosed with "a collapsed lung and blood clot in the lung" (pulmonary embolism).  Notes by Dr. Inez Pilgrim indicate a left lower extremity DVT.  He was treated with Lovenox then converted to Coumadin.  He did well for several years then developed pain and swelling behind his right leg.  He was diagnosed with a left lower extremity DVT (popliteal thrombosis,  non-occlusive) with a therapeutic INR (3.6)  An IVC filter was placed.  He was briefly on Lovenox, but then refused Lovenox, Arixtra, and Xarelto.  He has been adamant to only receive Coumadin.  He was last seen by Dr. Inez Pilgrim on 02/20/2015.  He has been on Coumadin 10 mg a day "for some time".  His blood was not checked until recently (11/28/2015).  INR was 1.58.  Despite multiple attempts, he was unable to be reached to adjust his Coumadin.  He states that he has been having trouble with his phone.  Additional labs on 11/28/2015 revealed a hematocrit of 43.3, hemoglobin 14.3, MCV 84.8, platelets 109,000, WBC 16,700 with an [place] 5200.  ALC was 10,600.  CMP was normal with a creatinine of 0.95.  He has a history of pulmonary nodules in 2002.  He notes a recent history of pancreatic problems.  He presented with acute abdominal and pelvic pain x 2 days.  Abdominal and pelvic CT scan without contrast on 12/09/2015 revealed pancreatic ductal dilatation within the body and tail of the pancreas with pancreatic atrophy (new since 2010).  There was a 3.2 x 2.7 cm cystic structure adjacent to the pancreatic head, as well as a 1 x 1.6 cm cystic structure/mass along  the medial uncinate process.  He states that he is scheduled to see GI.  Notes indicate a posbible MRCP.  Symptomatically, he feels great.  He denies any  B symptoms.  He denies any bruising or bleeding.  Past Medical History  Diagnosis Date  . Abdominal aortic aneurysm ([place])   . Pulmonary nodules   . Thrombocytopenia ([place])   . Presence of IVC filter   . Elevated cholesterol   . Diabetes mellitus, type 2 ([place])   . Cellulitis   . History of primary bladder cancer   . Hematuria   . Urinary frequency   . Phimosis     Past Surgical History  Procedure Laterality Date  . Cholecystectomy    . Cochlear implant    . Appendectomy    . Abdominal aortic aneurysm repair      Family History  Problem Relation Age of Onset  . Lung cancer Brother   .  Colon cancer Brother   . Cancer Sister     Renal  . Cancer Mother 58    Kidney  . Cancer Paternal Uncle     melanoma    Social History:  reports that he quit smoking 6 days ago. His smoking use included Cigarettes. He smoked 1.00 pack per day. He does not have any smokeless tobacco history on file. He reports that he does not drink alcohol. His drug history is not on file.  He is smoking less than 1/2 pack per day and is trying to quit.  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  The patient is alone today.  Allergies:  Allergies  Allergen Reactions  . Hydrocodone-Acetaminophen Other (See Comments)    Other reaction(s): Other (See Comments) intolerate intolerate  . Metformin Diarrhea  . Atorvastatin Rash    Current Medications: Current Outpatient Prescriptions  Medication Sig Dispense Refill  . ACCU-CHEK FASTCLIX LANCETS MISC     . ACCU-CHEK SMARTVIEW test strip     . Acetaminophen 500 MG coapsule     . Blood Glucose Monitoring Suppl (ACCU-CHEK NANO SMARTVIEW) W/DEVICE KIT     . Calcium Carbonate-Vitamin D (CALCIUM-D PO) Take by mouth daily.    . ciprofloxacin (CIPRO) 500 MG tablet Take 1 tablet (500 mg total) by mouth 2 (two) times daily. For 10 days 20 tablet 0  . glimepiride (AMARYL) 4 MG tablet Take 1 tablet (4 mg) in the morning and 1 tablet (4 mg) in the evening with food.    . Insulin Glargine (LANTUS) 100 UNIT/ML Solostar Pen Inject into the skin.    Marland Kitchen levothyroxine (SYNTHROID, LEVOTHROID) 150 MCG tablet Take by mouth.    . lovastatin (MEVACOR) 20 MG tablet Take by mouth.    . OMEPRAZOLE PO Take 20 mg by mouth daily.    Marland Kitchen RELION PEN NEEDLES 29G X 12MM MISC     . sitaGLIPtin (JANUVIA) 100 MG tablet Take by mouth.    Marland Kitchen NASAL DECONGESTANT SPRAY 0.05 % nasal spray     . warfarin (COUMADIN) 5 MG tablet Take 5 mg by mouth daily. Take 2 tablets daily ~ 5 days a week Take 2.5 tablets on Weds and Saturdays     No current facility-administered medications for this visit.     Review of Systems:  GENERAL:  Feels great.  No fevers or sweats.  Weight fluctuates. PERFORMANCE STATUS (ECOG):  1 HEENT:  No visual changes, runny nose, sore throat, mouth sores or tenderness. Lungs: No shortness of breath or cough.  No hemoptysis. Cardiac:  No chest pain, palpitations, orthopnea, or PND. GI:  Recent abdominal pain and pancreatic issues.  No nausea, vomiting, diarrhea, constipation, melena or hematochezia.  He has  never had a colonoscopy. GU:  No urgency, frequency, dysuria, or hematuria. Musculoskeletal:  No back pain.  No joint pain.  No muscle tenderness. Extremities:  No pain or swelling. Skin:  No rashes or skin changes. Neuro:  No headache, numbness or weakness, balance or coordination issues. Endocrine:  Diabetes.  Thyroid disease on Synthroid.  Nohot flashes or night sweats. Psych:  No mood changes, depression or anxiety. Pain:  No focal pain. Review of systems:  All other systems reviewed and found to be negative.  Physical Exam: Blood pressure 112/78, pulse 71, temperature 97.2 F (36.2 C), temperature source Tympanic, resp. rate 18, height 5' 8"  (1.727 m), weight 204 lb 5.9 oz (92.7 kg). GENERAL:  Well developed, well nourished, sitting comfortably in the exam room in no acute distress. MENTAL STATUS:  Alert and oriented to person, place and time. HEAD:  Wearing a [facility] cap hair.  White hair and mustache.  Normocephalic, atraumatic, face symmetric, no Cushingoid features. EYES:  Glasses.  Blue eyes.  Pupils equal round and reactive to light and accomodation.  No conjunctivitis or scleral icterus. ENT:  Right sided hearing aide (cochlear implant).  Oropharynx clear without lesion.  Partial.  Missing lower teeth.  Tongue normal. Mucous membranes moist.  RESPIRATORY:  Clear to auscultation without rales, wheezes or rhonchi. CARDIOVASCULAR:  Regular rate and rhythm without murmur, rub or gallop. ABDOMEN:  Soft, non-tender, with active bowel sounds, and no  hepatomegaly.  Spleen palpable 2-3 FB below left costal margin.  No masses. SKIN:  Vascular ectasias (right > left cheek).  Sun changes head and dorsum of hands.  No rashes. EXTREMITIES: Slight left lower extremity edema.  Right thumb wrapped.  No skin discoloration or tenderness.  No palpable cords. LYMPH NODES: No palpable cervical, supraclavicular, axillary or inguinal adenopathy  NEUROLOGICAL: Unremarkable. PSYCH:  Appropriate.  Office Visit on 12/19/2015  Component Date Value Ref Range Status  . WBC 12/19/2015 15.9* 3.8 - 10.6 K/uL Final  . RBC 12/19/2015 5.32  4.40 - 5.90 MIL/uL Final  . Hemoglobin 12/19/2015 14.8  13.0 - 18.0 g/dL Final  . HCT 12/19/2015 45.1  40.0 - 52.0 % Final  . MCV 12/19/2015 84.7  80.0 - 100.0 fL Final  . MCH 12/19/2015 27.9  26.0 - 34.0 pg Final  . MCHC 12/19/2015 32.9  32.0 - 36.0 g/dL Final  . RDW 12/19/2015 14.5  11.5 - 14.5 % Final  . Platelets 12/19/2015 95* 150 - 440 K/uL Final  . Neutrophils Relative % 12/19/2015 37%   Final  . Neutro Abs 12/19/2015 5.9  1.4 - 6.5 K/uL Final  . Lymphocytes Relative 12/19/2015 58%   Final  . Lymphs Abs 12/19/2015 9.1* 1.0 - 3.6 K/uL Final  . Monocytes Relative 12/19/2015 3%   Final  . Monocytes Absolute 12/19/2015 0.5  0.2 - 1.0 K/uL Final  . Eosinophils Relative 12/19/2015 1%   Final  . Eosinophils Absolute 12/19/2015 0.2  0 - 0.7 K/uL Final  . Basophils Relative 12/19/2015 1%   Final  . Basophils Absolute 12/19/2015 0.1  0 - 0.1 K/uL Final  . Prothrombin Time 12/19/2015 20.8* 11.6 - 15.2 seconds Final  . INR 12/19/2015 1.80* 0.00 - 1.49 Final    Assessment:  Kenneth Curry is a 82 y.o. 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  He was diagnosed with CLL in 1994 or 1996.  He has splenomegaly and chronic thrombocytopenia (100,000-120,000).  He had 2 bone marrow biopsies (one at [facility] with Dr Otelia Limes).  He has not  required treatment.  He has had no B symptoms, adenopathy, bruising or bleeding.  He has had no recurrent infections.    He has a history of superficial bladder cancer in 05/2014 after presenting with hematuria.  He underwent TURBT.  He has been followed by Dr Elnoria Howard, urologist.  His last cystoscopy on 10/23/2015 was negative.  He has a history of recurrent left DVT x 2-3.  He was diagnosed with an apparent pulmonary embolism in 08/2009.  "years ago".  He was diagnosed with a left lower extremity DVT (popliteal thrombosis, non-occlusive) with a therapeutic INR (3.6)  An IVC filter was placed.  He was briefly on Lovenox, but then refused Lovenox, Arixtra, and Xarelto.  He has been adamant to only receive Coumadin.  He is on Coumadin 10 mg a day.  He has a history of pulmonary nodules in 2002.  He notes a recent history of pancreatic problems.  He presented with acute abdominal pain .  Abdominal and pelvic CT scan without contrast on 12/09/2015 revealed pancreatic ductal dilatation within the body and tail of the pancreas with pancreatic atrophy (new since 2010).  There was a 3.2 x 2.7 cm cystic structure adjacent to the pancreatic head, as well as a 1 x 1.6 cm cystic structure/mass along  the medial uncinate process.    Symptomatically, he feels great.  He denies any B symptoms.  He denies any abdominal complaints.  He denies any bruising or bleeding.  INR is sub-therapeutic (1.8).  Plan: 1. Review entire medical history, diagnosis and management of CLL, bladder cancer, recurrent thrombosis, and recent concerns about a possible pancreatic lesion.  Discuss ongoing close follow-up of CLL without intervention.  Discuss indications for treatment.  Discuss mild thrombocytopenia secondary to CLL.  Discuss ongoing monitoring of non-invasive bladder cancer by urology.  Discuss concern about thrombosis on therapeutic Coumadin.  Patient wishes to continue.  Discuss adjustment of Coumadin to maintain an INR of 2-3,  although by history appears inadequate (clot at INR 3.6). 2. Labs today:  CBC with diff, PT/INR. 3. Increase Coumadin to 12.5 mg 2 days a week and 10 mg 5 days a week (total weekly dose 75 mg). 4. RTC in 1 week for labs (PT/INR). 5. RTC in 1 month for MD assessment and labs (CBC with diff, PT/INR).   Lequita Asal, MD   12/19/2015, 9:00 AM

## 2015-12-21 ENCOUNTER — Telehealth: Payer: Self-pay

## 2015-12-21 NOTE — Telephone Encounter (Signed)
  Oncology Nurse Navigator Documentation  Navigator Location: [facility] (12/21/15 1500) Navigator Encounter Type: Telephone (12/21/15 1500) Telephone: Incoming Call (12/21/15 1500)             Barriers/Navigation Needs: Coordination of Care (12/21/15 1500)   Interventions: Coordination of Care (12/21/15 1500)   Coordination of Care: EUS (12/21/15 1500)                  Time Spent with Patient: 30 (12/21/15 1500)   EUS scheduled for 01/10/16 with Dr Earlie Counts at [facility]. Went over instructions with souse. Copy of instructions also mailed to home address. Pt takes coumadin daily for history of blood clot. She was instructed that this medication will need to be stopped 5 days prior to EUS and that ordering physician Dr Mike Gip will be notified for permission to stop this medication.   INSTRUCTIONS FOR ENDOSCOPIC ULTRASOUND  -Your procedure has been scheduled for January 19th with Dr Earlie Counts at [place] will contact you to pre-register over the phone. If for any reason you have not received a call within one week prior to your scheduled procedure date, please call [phone]. -To get your scheduled arrival time, please call the Endoscopy unit at  [phone] between 1-3pm on: January 18th     -ON THE DAY OF YOU PROCEDURE:  1. If you are scheduled for a morning procedure, nothing to drink after midnight  -If you are scheduled for an afternoon procedure, you may have clear liquids until 5 hours prior  to the procedure but no carbonated drinks or broth  2. NO FOOD THE DAY OF YOUR PROCEDURE  3. You may take your heart, seizure, blood pressure, Parkinson's or breathing medications at  6am with just enough water to get your pills down  4. Do not take any oral Diabetic medications the morning of your procedure.  5. Do not take Vitamins     -On the day of your procedure, come to the [facility] Admitting/Registration desk (First desk on the right) at the scheduled  arrival time. You MUST have someone drive you home from your procedure. You must have a responsible adult with a valid drivers license who is on site throughout your entire procedure and who can stay with you for several hours after your procedure. You may not go home alone in a taxi, shuttle [place] or bus, as the drivers will not be responsible for you.  --If you have any questions please call me at the above contact

## 2015-12-21 NOTE — Telephone Encounter (Signed)
  Oncology Nurse Navigator Documentation  Navigator Location: [facility] (12/21/15 1300) Navigator Encounter Type: Telephone (12/21/15 1300) Telephone: Outgoing Call (12/21/15 1300)             Barriers/Navigation Needs: Coordination of Care (12/21/15 1300)   Interventions: Coordination of Care (12/21/15 1300)   Coordination of Care: EUS (12/21/15 1300)                  Time Spent with Patient: 15 (12/21/15 1300)   Received referral for EUS. EUS can be performed 01/10/16 at [facility] with Dr Earlie Counts. Voicemail left for him to return call for scheduling of this procedure.

## 2015-12-24 ENCOUNTER — Telehealth: Payer: Self-pay

## 2015-12-24 NOTE — Telephone Encounter (Signed)
Received referral for EUS. It has been scheduled for 01/10/16. Kenneth Curry will need to stop his coumadin 5 days in advance for this procedure. Please advise.

## 2015-12-25 ENCOUNTER — Inpatient Hospital Stay: Payer: Commercial Managed Care - HMO

## 2015-12-25 DIAGNOSIS — R42 Dizziness and giddiness: Secondary | ICD-10-CM | POA: Diagnosis not present

## 2015-12-25 DIAGNOSIS — D696 Thrombocytopenia, unspecified: Secondary | ICD-10-CM | POA: Diagnosis not present

## 2015-12-25 DIAGNOSIS — R161 Splenomegaly, not elsewhere classified: Secondary | ICD-10-CM | POA: Diagnosis not present

## 2015-12-25 DIAGNOSIS — C9110 Chronic lymphocytic leukemia of B-cell type not having achieved remission: Secondary | ICD-10-CM | POA: Diagnosis not present

## 2015-12-25 DIAGNOSIS — K8689 Other specified diseases of pancreas: Secondary | ICD-10-CM | POA: Diagnosis not present

## 2015-12-25 DIAGNOSIS — R918 Other nonspecific abnormal finding of lung field: Secondary | ICD-10-CM | POA: Diagnosis not present

## 2015-12-25 DIAGNOSIS — K862 Cyst of pancreas: Secondary | ICD-10-CM | POA: Diagnosis not present

## 2015-12-25 DIAGNOSIS — R001 Bradycardia, unspecified: Secondary | ICD-10-CM | POA: Diagnosis not present

## 2015-12-25 DIAGNOSIS — I82402 Acute embolism and thrombosis of unspecified deep veins of left lower extremity: Secondary | ICD-10-CM

## 2015-12-25 DIAGNOSIS — I714 Abdominal aortic aneurysm, without rupture: Secondary | ICD-10-CM | POA: Diagnosis not present

## 2015-12-25 LAB — PROTIME-INR
INR: 2.28 — ABNORMAL HIGH (ref 0.00–1.49)
Prothrombin Time: 24.9 seconds — ABNORMAL HIGH (ref 11.6–15.2)

## 2015-12-25 NOTE — Progress Notes (Unsigned)
Pt came in a day early at 3:30PM for his PT/INR. He has to work tomm and couldn't come in. He states he is taking two 5mg  tabs (10mg ) 5 days a week. He is taking two and 1/tabs (12.5mg ) 2 days a week on Weds and Saturdays.

## 2015-12-26 ENCOUNTER — Telehealth: Payer: Self-pay

## 2015-12-26 ENCOUNTER — Other Ambulatory Visit: Payer: Commercial Managed Care - HMO

## 2015-12-26 DIAGNOSIS — N390 Urinary tract infection, site not specified: Secondary | ICD-10-CM | POA: Diagnosis not present

## 2015-12-26 NOTE — Telephone Encounter (Signed)
Called pt left vm  for pt to please call back regarding coumadin and labs and to continue same dose currently on per MD

## 2015-12-28 ENCOUNTER — Telehealth: Payer: Self-pay

## 2015-12-28 ENCOUNTER — Telehealth: Payer: Self-pay | Admitting: Hematology and Oncology

## 2015-12-28 NOTE — Telephone Encounter (Signed)
Returned wife's phone call regarding my phone call.  She had left a message for RN that she understood that pt was to continue same dose of coumadin.  I left message today since she understood, she would only need to call me back with any further questions.

## 2015-12-28 NOTE — Telephone Encounter (Signed)
Wife called and said pt was told to continue taking same dosage of medicine but to please call back. She was calling back for him bc he has a difficult time hearing on the phone. Please call her to discuss if there is something more you need to tell them. Thanks.

## 2015-12-28 NOTE — Telephone Encounter (Signed)
  Please call patient and his wife back.  Thanks  M

## 2016-01-02 ENCOUNTER — Inpatient Hospital Stay (HOSPITAL_BASED_OUTPATIENT_CLINIC_OR_DEPARTMENT_OTHER): Payer: Commercial Managed Care - HMO | Admitting: Hematology and Oncology

## 2016-01-02 ENCOUNTER — Inpatient Hospital Stay: Payer: Commercial Managed Care - HMO

## 2016-01-02 ENCOUNTER — Encounter: Payer: Self-pay | Admitting: Hematology and Oncology

## 2016-01-02 VITALS — BP 124/59 | HR 48 | Temp 96.6°F | Resp 18 | Ht 68.0 in | Wt 204.8 lb

## 2016-01-02 DIAGNOSIS — E119 Type 2 diabetes mellitus without complications: Secondary | ICD-10-CM

## 2016-01-02 DIAGNOSIS — D696 Thrombocytopenia, unspecified: Secondary | ICD-10-CM | POA: Diagnosis not present

## 2016-01-02 DIAGNOSIS — Z8051 Family history of malignant neoplasm of kidney: Secondary | ICD-10-CM

## 2016-01-02 DIAGNOSIS — R42 Dizziness and giddiness: Secondary | ICD-10-CM

## 2016-01-02 DIAGNOSIS — F17210 Nicotine dependence, cigarettes, uncomplicated: Secondary | ICD-10-CM

## 2016-01-02 DIAGNOSIS — Z808 Family history of malignant neoplasm of other organs or systems: Secondary | ICD-10-CM

## 2016-01-02 DIAGNOSIS — R161 Splenomegaly, not elsewhere classified: Secondary | ICD-10-CM

## 2016-01-02 DIAGNOSIS — Z8551 Personal history of malignant neoplasm of bladder: Secondary | ICD-10-CM

## 2016-01-02 DIAGNOSIS — I714 Abdominal aortic aneurysm, without rupture: Secondary | ICD-10-CM | POA: Diagnosis not present

## 2016-01-02 DIAGNOSIS — K862 Cyst of pancreas: Secondary | ICD-10-CM

## 2016-01-02 DIAGNOSIS — Z8701 Personal history of pneumonia (recurrent): Secondary | ICD-10-CM

## 2016-01-02 DIAGNOSIS — E7800 Pure hypercholesterolemia, unspecified: Secondary | ICD-10-CM

## 2016-01-02 DIAGNOSIS — R001 Bradycardia, unspecified: Secondary | ICD-10-CM | POA: Diagnosis not present

## 2016-01-02 DIAGNOSIS — Z794 Long term (current) use of insulin: Secondary | ICD-10-CM

## 2016-01-02 DIAGNOSIS — Z86711 Personal history of pulmonary embolism: Secondary | ICD-10-CM

## 2016-01-02 DIAGNOSIS — Z801 Family history of malignant neoplasm of trachea, bronchus and lung: Secondary | ICD-10-CM

## 2016-01-02 DIAGNOSIS — I82449 Acute embolism and thrombosis of unspecified tibial vein: Secondary | ICD-10-CM

## 2016-01-02 DIAGNOSIS — K8689 Other specified diseases of pancreas: Secondary | ICD-10-CM

## 2016-01-02 DIAGNOSIS — C9110 Chronic lymphocytic leukemia of B-cell type not having achieved remission: Secondary | ICD-10-CM | POA: Diagnosis not present

## 2016-01-02 DIAGNOSIS — Z7901 Long term (current) use of anticoagulants: Secondary | ICD-10-CM

## 2016-01-02 DIAGNOSIS — Z7984 Long term (current) use of oral hypoglycemic drugs: Secondary | ICD-10-CM

## 2016-01-02 DIAGNOSIS — Z800 Family history of malignant neoplasm of digestive organs: Secondary | ICD-10-CM

## 2016-01-02 DIAGNOSIS — Z86718 Personal history of other venous thrombosis and embolism: Secondary | ICD-10-CM

## 2016-01-02 DIAGNOSIS — N471 Phimosis: Secondary | ICD-10-CM

## 2016-01-02 DIAGNOSIS — Z79899 Other long term (current) drug therapy: Secondary | ICD-10-CM

## 2016-01-02 DIAGNOSIS — R918 Other nonspecific abnormal finding of lung field: Secondary | ICD-10-CM | POA: Diagnosis not present

## 2016-01-02 LAB — CBC WITH DIFFERENTIAL/PLATELET
Basophils Absolute: 0.1 10*3/uL (ref 0–0.1)
Basophils Relative: 1 %
Eosinophils Absolute: 0.2 10*3/uL (ref 0–0.7)
Eosinophils Relative: 2 %
HCT: 40.8 % (ref 40.0–52.0)
Hemoglobin: 13.5 g/dL (ref 13.0–18.0)
Lymphocytes Relative: 58 %
Lymphs Abs: 7.5 10*3/uL — ABNORMAL HIGH (ref 1.0–3.6)
MCH: 27.7 pg (ref 26.0–34.0)
MCHC: 33.1 g/dL (ref 32.0–36.0)
MCV: 83.7 fL (ref 80.0–100.0)
Monocytes Absolute: 0.4 10*3/uL (ref 0.2–1.0)
Monocytes Relative: 3 %
Neutro Abs: 4.7 10*3/uL (ref 1.4–6.5)
Neutrophils Relative %: 36 %
Platelets: 102 10*3/uL — ABNORMAL LOW (ref 150–440)
RBC: 4.88 MIL/uL (ref 4.40–5.90)
RDW: 14.7 % — ABNORMAL HIGH (ref 11.5–14.5)
WBC: 12.9 10*3/uL — ABNORMAL HIGH (ref 3.8–10.6)

## 2016-01-02 LAB — COMPREHENSIVE METABOLIC PANEL
ALT: 21 U/L (ref 17–63)
AST: 21 U/L (ref 15–41)
Albumin: 3.7 g/dL (ref 3.5–5.0)
Alkaline Phosphatase: 55 U/L (ref 38–126)
Anion gap: 9 (ref 5–15)
BUN: 19 mg/dL (ref 6–20)
CO2: 28 mmol/L (ref 22–32)
Calcium: 9.1 mg/dL (ref 8.9–10.3)
Chloride: 101 mmol/L (ref 101–111)
Creatinine, Ser: 1.06 mg/dL (ref 0.61–1.24)
GFR calc Af Amer: >60 mL/min (ref >60)
GFR calc non Af Amer: >60 mL/min (ref >60)
Glucose, Bld: 273 mg/dL — ABNORMAL HIGH (ref 65–99)
Potassium: 4.5 mmol/L (ref 3.5–5.1)
Sodium: 138 mmol/L (ref 135–145)
Total Bilirubin: 0.7 mg/dL (ref 0.3–1.2)
Total Protein: 6.8 g/dL (ref 6.5–8.1)

## 2016-01-02 LAB — PROTIME-INR
INR: 2.3 — ABNORMAL HIGH (ref 0.00–1.49)
Prothrombin Time: 25 seconds — ABNORMAL HIGH (ref 11.6–15.2)

## 2016-01-02 NOTE — Progress Notes (Signed)
Spoke with pt about concern of possibility of needing Lovenox.  Pt stated of he does he cannot afford it, I told pt if he needs it I will speak with social worker for assistance.  No other concerns

## 2016-01-02 NOTE — Progress Notes (Signed)
[facility] day:  01/02/2016  Chief Complaint: Kenneth Curry is a 82 y.o. male with CLL, a history of bladder tumor, and left lower extremity DVT who is seen for Coumadin management prior to an upcoming ERCP/endoscopic ultrasound.  HPI:   The patient  was last seen in the medical oncology clinic on 12/19/2015.  At that time, he was seen for initial assessment by me.  He was noted to have CLL with associated thrombocytopenia, a history of bladder cancer, and a left lower extremity DVT.  He was previously seen by Dr. Inez Pilgrim.  We discussed ongoing surveillance of his CLL and superficial bladder cancer.  INR was sub-therapeutic on Coumadin 10 mg a day.  Dose was increased to 10 mg 5 days a week and 12.5 mg 2 days a week (total weekly dose: 75 mg).  His INR has been followed.  INR was  2.28 on 12/25/2015.  At last visit, he noted recent pancreatic problems after presenting with acute abdominal pain.  Abdominal and pelvic CT scan without contrast on 12/09/2015 revealed pancreatic ductal dilatation within the body and tail of the pancreas with pancreatic atrophy (new since 2010).  There was a 3.2 x 2.7 cm cystic structure adjacent to the pancreatic head, as well as a 1 x 1.6 cm cystic structure/mass along  the medial uncinate process.  He was initially considered for an MRCP, but notes by Dr Rayann Heman of gastroenterology documented a contraindication secondary to right cochlear implant and IVC filter.  He is scheduled for EUS on 01/10/2016 by Dr. Earlie Counts at [facility].  Per Mariea Clonts, nurse navigator, he requires holding his Coumadin 5 days prior to the procedure.  Symptomatically, he denies any abdominal pain.  He denies any B symptoms.  He denies any bruising or bleeding.  He notes a 1-2 week history of dizziness.  Past Medical History  Diagnosis Date  . Abdominal aortic aneurysm ([place])   . Pulmonary nodules   . Thrombocytopenia ([place])   . Presence of IVC filter   .  Elevated cholesterol   . Diabetes mellitus, type 2 ([place])   . Cellulitis   . History of primary bladder cancer   . Hematuria   . Urinary frequency   . Phimosis     Past Surgical History  Procedure Laterality Date  . Cholecystectomy    . Cochlear implant    . Appendectomy    . Abdominal aortic aneurysm repair      Family History  Problem Relation Age of Onset  . Lung cancer Brother   . Colon cancer Brother   . Cancer Sister     Renal  . Cancer Mother 73    Kidney  . Cancer Paternal Uncle     melanoma    Social History:  reports that he quit smoking 6 days ago. His smoking use included Cigarettes. He smoked 1.00 pack per day. He does not have any smokeless tobacco history on file. He reports that he does not drink alcohol. His drug history is not on file.  He previously smoked < 1/2 pack per day.  He stopped smoking on 12/28/2015.  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  The patient is accompanied by his wife today.  Allergies:  Allergies  Allergen Reactions  . Hydrocodone-Acetaminophen Other (See Comments)    Other reaction(s): Other (See Comments) intolerate intolerate  . Metformin Diarrhea  . Atorvastatin Rash    Current Medications: Current Outpatient Prescriptions  Medication Sig Dispense Refill  . ACCU-CHEK FASTCLIX LANCETS MISC     . ACCU-CHEK SMARTVIEW test strip     . Acetaminophen 500 MG coapsule     . Blood Glucose Monitoring Suppl (ACCU-CHEK NANO SMARTVIEW) W/DEVICE KIT     . Calcium Carbonate-Vitamin D (CALCIUM-D PO) Take by mouth daily.    . ciprofloxacin (CIPRO) 500 MG tablet Take 1 tablet (500 mg total) by mouth 2 (two) times daily. For 10 days 20 tablet 0  . glimepiride (AMARYL) 4 MG tablet Take 1 tablet (4 mg) in the morning and 1 tablet (4 mg) in the evening with food.    . Insulin Glargine (LANTUS) 100 UNIT/ML Solostar Pen Inject into the skin.    Marland Kitchen levothyroxine (SYNTHROID, LEVOTHROID) 150 MCG tablet Take by mouth.    . lovastatin (MEVACOR)  20 MG tablet Take by mouth.    . OMEPRAZOLE PO Take 20 mg by mouth daily.    Marland Kitchen RELION PEN NEEDLES 29G X 12MM MISC     . sitaGLIPtin (JANUVIA) 100 MG tablet Take by mouth.    . warfarin (COUMADIN) 5 MG tablet Take 5 mg by mouth daily. Take 2 tablets daily ~ 5 days a week Take 2.5 tablets on Weds and Saturdays    . NASAL DECONGESTANT SPRAY 0.05 % nasal spray      No current facility-administered medications for this visit.    Review of Systems:  GENERAL:  Feels good.  Active.  No fevers, sweats or weight loss. PERFORMANCE STATUS (ECOG):  1 HEENT:  No visual changes, runny nose, sore throat, mouth sores or tenderness. Lungs: No shortness of breath or cough.  No hemoptysis. Cardiac:  No chest pain, palpitations, orthopnea, or PND. GI:  Denies any current abdominal pain.  No nausea, vomiting, diarrhea, constipation, melena or hematochezia. GU:  No urgency, frequency, dysuria, or hematuria. Musculoskeletal:  No back pain.  No joint pain.  No muscle tenderness. Extremities:  No pain or swelling. Skin:  No rashes or skin changes. Neuro:  1-2 week history of dizziness.  No headache, numbness or weakness, balance or coordination issues. Endocrine:  Diabetes.  Thyroid disease on Synthroid.  No hot flashes or night sweats. Psych:  No mood changes, depression or anxiety. Pain:  No focal pain. Review of systems:  All other systems reviewed and found to be negative.  Physical Exam: Blood pressure 124/59, pulse 48, temperature 96.6 F (35.9 C), temperature source Oral, resp. rate 18, height 5' 8"  (1.727 m), weight 204 lb 12.9 oz (92.9 kg), SpO2 98 %. GENERAL:  Well developed, well nourished, sitting comfortably in the exam room in no acute distress. MENTAL STATUS:  Alert and oriented to person, place and time. HEAD:  Short white hair.  Normocephalic, atraumatic, face symmetric, no Cushingoid features. EYES:  Blue eyes.  Pupils equal round and reactive to light and accomodation.  No conjunctivitis  or scleral icterus. ENT:  Right cochlear implant.  Oropharynx clear without lesion.  Tongue normal. Mucous membranes moist.  RESPIRATORY:  Clear to auscultation without rales, wheezes or rhonchi. CARDIOVASCULAR:  Bradycardia.  Intermittently irregular rate and rhythm .  No murmur, rub or gallop. ABDOMEN:  Soft, non-tender, with active bowel sounds, and no hepatomegaly.  Spleen tip palpable.  No masses. SKIN:  No rashes, ulcers or lesions. EXTREMITIES: Slight left lower extremity edema (no change).  No skin discoloration or tenderness.  No palpable cords. LYMPH NODES: No palpable cervical, supraclavicular, axillary or inguinal adenopathy  NEUROLOGICAL: Unremarkable. PSYCH:  Appropriate.  Office Visit on 01/02/2016  Component Date Value Ref Range Status  . WBC 01/02/2016 12.9* 3.8 - 10.6 K/uL Final  . RBC 01/02/2016 4.88  4.40 - 5.90 MIL/uL Final  . Hemoglobin 01/02/2016 13.5  13.0 - 18.0 g/dL Final  . HCT 01/02/2016 40.8  40.0 - 52.0 % Final  . MCV 01/02/2016 83.7  80.0 - 100.0 fL Final  . MCH 01/02/2016 27.7  26.0 - 34.0 pg Final  . MCHC 01/02/2016 33.1  32.0 - 36.0 g/dL Final  . RDW 01/02/2016 14.7* 11.5 - 14.5 % Final  . Platelets 01/02/2016 102* 150 - 440 K/uL Final  . Neutrophils Relative % 01/02/2016 36   Final  . Neutro Abs 01/02/2016 4.7  1.4 - 6.5 K/uL Final  . Lymphocytes Relative 01/02/2016 58   Final  . Lymphs Abs 01/02/2016 7.5* 1.0 - 3.6 K/uL Final  . Monocytes Relative 01/02/2016 3   Final  . Monocytes Absolute 01/02/2016 0.4  0.2 - 1.0 K/uL Final  . Eosinophils Relative 01/02/2016 2   Final  . Eosinophils Absolute 01/02/2016 0.2  0 - 0.7 K/uL Final  . Basophils Relative 01/02/2016 1   Final  . Basophils Absolute 01/02/2016 0.1  0 - 0.1 K/uL Final  . Sodium 01/02/2016 138  135 - 145 mmol/L Final  . Potassium 01/02/2016 4.5  3.5 - 5.1 mmol/L Final  . Chloride 01/02/2016 101  101 - 111 mmol/L Final  . CO2 01/02/2016 28  22 - 32 mmol/L Final  . Glucose, Bld 01/02/2016  273* 65 - 99 mg/dL Final  . BUN 01/02/2016 19  6 - 20 mg/dL Final  . Creatinine, Ser 01/02/2016 1.06  0.61 - 1.24 mg/dL Final  . Calcium 01/02/2016 9.1  8.9 - 10.3 mg/dL Final  . Total Protein 01/02/2016 6.8  6.5 - 8.1 g/dL Final  . Albumin 01/02/2016 3.7  3.5 - 5.0 g/dL Final  . AST 01/02/2016 21  15 - 41 U/L Final  . ALT 01/02/2016 21  17 - 63 U/L Final  . Alkaline Phosphatase 01/02/2016 55  38 - 126 U/L Final  . Total Bilirubin 01/02/2016 0.7  0.3 - 1.2 mg/dL Final  . GFR calc non Af Amer 01/02/2016 >60  >60 mL/min Final  . GFR calc Af Amer 01/02/2016 >60  >60 mL/min Final   Comment: (NOTE) The eGFR has been calculated using the CKD EPI equation. This calculation has not been validated in all clinical situations. eGFR's persistently <60 mL/min signify possible Chronic Kidney Disease.   . Anion gap 01/02/2016 9  5 - 15 Final  . Prothrombin Time 01/02/2016 25.0* 11.6 - 15.2 seconds Final  . INR 01/02/2016 2.30* 0.00 - 1.49 Final    Assessment:  Kenneth Curry is a 82 y.o. 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  He was diagnosed with CLL in 1994 or 1996. He has splenomegaly and chronic thrombocytopenia (100,000-120,000). He had 2 bone marrow biopsies (one at [facility] with Dr Otelia Limes). He has not required treatment. He has had no B symptoms, adenopathy, bruising or bleeding. He has had no recurrent infections.   He has a history of superficial bladder cancer in 05/2014 after presenting with hematuria. He underwent TURBT. He has been followed by Dr Elnoria Howard, urologist. His last cystoscopy on 10/23/2015 was negative.  He has a history of recurrent left DVT x 2-3. He was diagnosed with an apparent pulmonary embolism in 08/2009. "  years ago". He was diagnosed with a left lower extremity DVT (popliteal thrombosis, non-occlusive) with a therapeutic INR (3.6) An IVC filter was placed. He was  briefly on Lovenox, but then refused Lovenox, Arixtra, and Xarelto. He has been adamant to only receive Coumadin. He is on Coumadin 10 mg a day 5 days a week and 12.5 mg 2 days a week (total weekly dose 75 mg).  He has a history of pulmonary nodules in 2002. He notes a recent history of pancreatic problems. He presented with acute abdominal pain . Abdominal and pelvic CT scan without contrast on 12/09/2015 revealed pancreatic ductal dilatation within the body and tail of the pancreas with pancreatic atrophy (new since 2010). There was a 3.2 x 2.7 cm cystic structure adjacent to the pancreatic head, as well as a 1 x 1.6 cm cystic structure/mass along the medial uncinate process.   Symptomatically, he denies any current abdominal pain.  He has had a 1-2 week history of dizziness.  He has bradycardia (new). He is on Coumadin.  He denies any bruising or bleeding. Platelet count is 102,000 (stable).  INR is therapeutic (2.3).    He is scheduled for endoscopic ultrasound to assess his pancreas and requires being off his Coumadin for 5 days.  Plan: 1. Review history of recurrent thrombosis (DVT and PE) and thrombosis with a therapeutic INR.  Discuss upcoming procedure.  Discuss rationale of Lovenox bridge.  Discuss risk of thrombosis when INR sub-therapeutic and when starting Coumadin without Lovenox coverage secondary to transient hypercoagulable state due to protein C and S.  Patient initially adamant about no Lovenox secondary to cost.  He requires assistance. 2. Labs today:  CBC with diff, CMP, PT/INR. 3. Continue Coumadin with last dose on 01/04/2016 (Friday). 4. Begin Lovenox 90 mg (1 mg/kg) SQ q 12 hours beginning 01/06/2016 - 01/08/2016 (Sunday, Monday, Tuesday). 5. No Lovenox on 01/09/2016 (Wednesday) with procedure on 01/10/2016 (Thursday). 6. Restart Lovenox and Coumadin as directed by MD performing procedure (obtain clearance for restart). 7. Discuss consideration of anti-Xa level while  on Lovenox to obtain therapeutic level (notation in chart of both 90 mg q 12 hour and 80 mg q 12 hour dosing).  Level checked 4 hours after dosing. 8. Discuss symptomatic bradycardia.  Discuss plan for EKG.  Phone follow-up with Dr. Raechel Ache today.  Last 5 outpatient visits, pulse was 60-94.  Patient to be seen in Dr. Raechel Ache' clinic today.  Patient notified. 9. RTC on 02/01 for labs (CBC with diff, PT/INR). 10. RTC on 02/08 for MD assess and labs (CBC with diff, PT/INR).   Lequita Asal, MD   01/02/2016, 9:10 AM

## 2016-01-03 ENCOUNTER — Encounter: Payer: Self-pay | Admitting: Hematology and Oncology

## 2016-01-03 ENCOUNTER — Telehealth: Payer: Self-pay

## 2016-01-03 ENCOUNTER — Other Ambulatory Visit: Payer: Self-pay

## 2016-01-03 DIAGNOSIS — R001 Bradycardia, unspecified: Secondary | ICD-10-CM | POA: Diagnosis not present

## 2016-01-03 DIAGNOSIS — I82512 Chronic embolism and thrombosis of left femoral vein: Secondary | ICD-10-CM | POA: Diagnosis not present

## 2016-01-03 DIAGNOSIS — D696 Thrombocytopenia, unspecified: Secondary | ICD-10-CM | POA: Diagnosis not present

## 2016-01-03 DIAGNOSIS — Z8679 Personal history of other diseases of the circulatory system: Secondary | ICD-10-CM | POA: Diagnosis not present

## 2016-01-03 DIAGNOSIS — E782 Mixed hyperlipidemia: Secondary | ICD-10-CM | POA: Diagnosis not present

## 2016-01-03 DIAGNOSIS — Z9889 Other specified postprocedural states: Secondary | ICD-10-CM | POA: Diagnosis not present

## 2016-01-03 MED ORDER — ENOXAPARIN SODIUM 100 MG/ML ~~LOC~~ SOLN: 90.0000 mg | Freq: Two times a day (BID) | SUBCUTANEOUS | Status: DC

## 2016-01-03 NOTE — Telephone Encounter (Signed)
  Oncology Nurse Navigator Documentation  Navigator Location: [facility] (01/03/16 1400) Navigator Encounter Type: Telephone (01/03/16 1400) Telephone: Outgoing Call (01/03/16 1400)             Barriers/Navigation Needs: Coordination of Care (01/03/16 1400)   Interventions: Coordination of Care (01/03/16 1400)   Coordination of Care: EUS;Other (lovenox bridge) (01/03/16 1400)                  Time Spent with Patient: 30 (01/03/16 1400)   Spoke with spouse [place]. Gave her the following information regarding Lovenox bridge per Dr Mike Gip with readback performed. Lovenox will be called into [facility]. [facility] will assist with payment. Dose will be 90mg . Get pharmacist to show you how to waste extra if needed. He will take 90mg  twice a day 12 hours apart. Examples of 9a-9p and 8a-8p given. Stop coumadin after 1/27 dose. Take nothing on 1/28 Start Lovenox am of 1/29 Stop Lovenox after pm dose on 1/31 Take nothing on 2/1 EUS is on 2/2 Dr Earlie Counts will instruct you on resuming Lovenox and Coumadin and Dr Mike Gip will adjust coumadin dose following restart. These instructions were readback from spouse.

## 2016-01-05 ENCOUNTER — Encounter: Payer: Self-pay | Admitting: Hematology and Oncology

## 2016-01-09 ENCOUNTER — Telehealth: Payer: Self-pay

## 2016-01-09 ENCOUNTER — Inpatient Hospital Stay: Payer: Commercial Managed Care - HMO | Attending: Hematology and Oncology

## 2016-01-09 ENCOUNTER — Ambulatory Visit: Payer: Commercial Managed Care - HMO | Admitting: Hematology and Oncology

## 2016-01-09 DIAGNOSIS — C259 Malignant neoplasm of pancreas, unspecified: Secondary | ICD-10-CM | POA: Insufficient documentation

## 2016-01-09 DIAGNOSIS — C9110 Chronic lymphocytic leukemia of B-cell type not having achieved remission: Secondary | ICD-10-CM

## 2016-01-09 DIAGNOSIS — Z8551 Personal history of malignant neoplasm of bladder: Secondary | ICD-10-CM | POA: Insufficient documentation

## 2016-01-09 DIAGNOSIS — E7800 Pure hypercholesterolemia, unspecified: Secondary | ICD-10-CM | POA: Insufficient documentation

## 2016-01-09 DIAGNOSIS — E119 Type 2 diabetes mellitus without complications: Secondary | ICD-10-CM | POA: Diagnosis not present

## 2016-01-09 DIAGNOSIS — R109 Unspecified abdominal pain: Secondary | ICD-10-CM | POA: Diagnosis not present

## 2016-01-09 DIAGNOSIS — D696 Thrombocytopenia, unspecified: Secondary | ICD-10-CM | POA: Diagnosis not present

## 2016-01-09 DIAGNOSIS — F17210 Nicotine dependence, cigarettes, uncomplicated: Secondary | ICD-10-CM | POA: Diagnosis not present

## 2016-01-09 DIAGNOSIS — R319 Hematuria, unspecified: Secondary | ICD-10-CM | POA: Insufficient documentation

## 2016-01-09 DIAGNOSIS — N471 Phimosis: Secondary | ICD-10-CM | POA: Insufficient documentation

## 2016-01-09 DIAGNOSIS — K859 Acute pancreatitis without necrosis or infection, unspecified: Secondary | ICD-10-CM | POA: Insufficient documentation

## 2016-01-09 DIAGNOSIS — Z86718 Personal history of other venous thrombosis and embolism: Secondary | ICD-10-CM | POA: Insufficient documentation

## 2016-01-09 DIAGNOSIS — R918 Other nonspecific abnormal finding of lung field: Secondary | ICD-10-CM | POA: Diagnosis not present

## 2016-01-09 DIAGNOSIS — R161 Splenomegaly, not elsewhere classified: Secondary | ICD-10-CM | POA: Diagnosis not present

## 2016-01-09 LAB — CBC WITH DIFFERENTIAL/PLATELET
Basophils Absolute: 0.1 K/uL (ref 0–0.1)
Basophils Relative: 1 %
Eosinophils Absolute: 0.2 K/uL (ref 0–0.7)
Eosinophils Relative: 1 %
HCT: 39 % — ABNORMAL LOW (ref 40.0–52.0)
Hemoglobin: 13.3 g/dL (ref 13.0–18.0)
Lymphocytes Relative: 62 %
Lymphs Abs: 6.9 K/uL — ABNORMAL HIGH (ref 1.0–3.6)
MCH: 28.1 pg (ref 26.0–34.0)
MCHC: 34 g/dL (ref 32.0–36.0)
MCV: 82.6 fL (ref 80.0–100.0)
Monocytes Absolute: 0.4 K/uL (ref 0.2–1.0)
Monocytes Relative: 4 %
Neutro Abs: 3.4 K/uL (ref 1.4–6.5)
Neutrophils Relative %: 32 %
Platelets: 96 K/uL — ABNORMAL LOW (ref 150–440)
RBC: 4.72 MIL/uL (ref 4.40–5.90)
RDW: 15.2 % — ABNORMAL HIGH (ref 11.5–14.5)
WBC: 10.9 K/uL — ABNORMAL HIGH (ref 3.8–10.6)

## 2016-01-09 LAB — PROTIME-INR
INR: 1.08 (ref 0.00–1.49)
Prothrombin Time: 14.2 s (ref 11.6–15.2)

## 2016-01-09 NOTE — Telephone Encounter (Signed)
  Oncology Nurse Navigator Documentation  Navigator Location: [facility] (01/09/16 1200) Navigator Encounter Type: Telephone (01/09/16 1200) Telephone: Lahoma Crocker Call (01/09/16 1200)             Barriers/Navigation Needs: Coordination of Care (01/09/16 1200)   Interventions: Coordination of Care (01/09/16 1200)   Coordination of Care: EUS (01/09/16 1200)                  Time Spent with Patient: 30 (01/09/16 1200)   Called and spoke with souse regarding Mr Fetterman not having his blood drawn this am. She will attempt to contact him and get him to go and have labs done. She states he left his hearing aid batteries at home and may not be able to hear his phone. Expressed importance of this because if his PT is not ok his procedure would be cancelled. She expressed understanding. Provided her with arrival time of 1230pm. If labs are not completed he will need to arrive earlier.

## 2016-01-09 NOTE — Progress Notes (Signed)
  Oncology Nurse Navigator Documentation  Navigator Location: [facility] (01/09/16 1700)                                            Time Spent with Patient: 15 (01/09/16 1700)   Lab results from 01/09/16 sent to Dr Earlie Counts for his EUS tomorrow

## 2016-01-10 ENCOUNTER — Encounter
Admission: RE | Disposition: A | Discharge status: Home / Self Care | Payer: Self-pay | Source: Ambulatory Visit | Attending: Gastroenterology

## 2016-01-10 ENCOUNTER — Ambulatory Visit: Payer: Commercial Managed Care - HMO | Admitting: Anesthesiology

## 2016-01-10 ENCOUNTER — Ambulatory Visit
Admission: RE | Admit: 2016-01-10 | Discharge: 2016-01-10 | Disposition: A | Discharge status: Home / Self Care | Payer: Commercial Managed Care - HMO | Source: Ambulatory Visit | Attending: Gastroenterology | Admitting: Gastroenterology

## 2016-01-10 ENCOUNTER — Encounter: Payer: Self-pay | Admitting: *Deleted

## 2016-01-10 DIAGNOSIS — Z7901 Long term (current) use of anticoagulants: Secondary | ICD-10-CM | POA: Diagnosis not present

## 2016-01-10 DIAGNOSIS — J449 Chronic obstructive pulmonary disease, unspecified: Secondary | ICD-10-CM | POA: Insufficient documentation

## 2016-01-10 DIAGNOSIS — Z794 Long term (current) use of insulin: Secondary | ICD-10-CM | POA: Insufficient documentation

## 2016-01-10 DIAGNOSIS — I899 Noninfective disorder of lymphatic vessels and lymph nodes, unspecified: Secondary | ICD-10-CM | POA: Insufficient documentation

## 2016-01-10 DIAGNOSIS — K3189 Other diseases of stomach and duodenum: Secondary | ICD-10-CM | POA: Diagnosis not present

## 2016-01-10 DIAGNOSIS — E039 Hypothyroidism, unspecified: Secondary | ICD-10-CM | POA: Diagnosis not present

## 2016-01-10 DIAGNOSIS — E7800 Pure hypercholesterolemia, unspecified: Secondary | ICD-10-CM | POA: Insufficient documentation

## 2016-01-10 DIAGNOSIS — Z79899 Other long term (current) drug therapy: Secondary | ICD-10-CM | POA: Diagnosis not present

## 2016-01-10 DIAGNOSIS — K862 Cyst of pancreas: Secondary | ICD-10-CM | POA: Insufficient documentation

## 2016-01-10 DIAGNOSIS — I739 Peripheral vascular disease, unspecified: Secondary | ICD-10-CM | POA: Diagnosis not present

## 2016-01-10 DIAGNOSIS — C250 Malignant neoplasm of head of pancreas: Secondary | ICD-10-CM | POA: Diagnosis not present

## 2016-01-10 DIAGNOSIS — E119 Type 2 diabetes mellitus without complications: Secondary | ICD-10-CM | POA: Insufficient documentation

## 2016-01-10 DIAGNOSIS — Z8551 Personal history of malignant neoplasm of bladder: Secondary | ICD-10-CM | POA: Insufficient documentation

## 2016-01-10 DIAGNOSIS — Z87891 Personal history of nicotine dependence: Secondary | ICD-10-CM | POA: Diagnosis not present

## 2016-01-10 DIAGNOSIS — Z888 Allergy status to other drugs, medicaments and biological substances status: Secondary | ICD-10-CM | POA: Insufficient documentation

## 2016-01-10 HISTORY — PX: UPPER ESOPHAGEAL ENDOSCOPIC ULTRASOUND (EUS): SHX6562

## 2016-01-10 LAB — AMYLASE, BODY FLUID

## 2016-01-10 LAB — GLUCOSE, CAPILLARY: GLUCOSE-CAPILLARY: 107 mg/dL — AB (ref 65–99)

## 2016-01-10 SURGERY — UPPER ESOPHAGEAL ENDOSCOPIC ULTRASOUND (EUS)
Anesthesia: General

## 2016-01-10 MED ORDER — CIPROFLOXACIN IN D5W 400 MG/200ML IV SOLN
400.0000 mg | Freq: Once | INTRAVENOUS | Status: AC
  Administered 2016-01-10: 400 mg via INTRAVENOUS
  Filled 2016-01-10: qty 200

## 2016-01-10 MED ORDER — VASOPRESSIN 20 UNIT/ML IV SOLN
INTRAVENOUS | Status: DC | PRN
  Administered 2016-01-10 (×2): 1 [IU] via INTRAVENOUS

## 2016-01-10 MED ORDER — PHENYLEPHRINE HCL 10 MG/ML IJ SOLN
INTRAMUSCULAR | Status: DC | PRN
  Administered 2016-01-10: 50 ug via INTRAVENOUS
  Administered 2016-01-10: 100 ug via INTRAVENOUS

## 2016-01-10 MED ORDER — FENTANYL CITRATE (PF) 100 MCG/2ML IJ SOLN
INTRAMUSCULAR | Status: DC | PRN
  Administered 2016-01-10: 25 ug via INTRAVENOUS

## 2016-01-10 MED ORDER — GLYCOPYRROLATE 0.2 MG/ML IJ SOLN
INTRAMUSCULAR | Status: DC | PRN
  Administered 2016-01-10: 0.2 mg via INTRAVENOUS

## 2016-01-10 MED ORDER — SODIUM CHLORIDE 0.9 % IV SOLN
INTRAVENOUS | Status: DC
  Administered 2016-01-10: 1000 mL via INTRAVENOUS

## 2016-01-10 MED ORDER — CIPROFLOXACIN HCL 500 MG PO TABS: 500.0000 mg | ORAL_TABLET | Freq: Two times a day (BID) | ORAL | Status: DC

## 2016-01-10 MED ORDER — EPHEDRINE SULFATE 50 MG/ML IJ SOLN
INTRAMUSCULAR | Status: DC | PRN
  Administered 2016-01-10 (×2): 5 mg via INTRAVENOUS

## 2016-01-10 MED ORDER — PROPOFOL 10 MG/ML IV BOLUS
INTRAVENOUS | Status: DC | PRN
  Administered 2016-01-10: 40 mg via INTRAVENOUS

## 2016-01-10 MED ORDER — LIDOCAINE HCL (CARDIAC) 20 MG/ML IV SOLN
INTRAVENOUS | Status: DC | PRN
  Administered 2016-01-10: 60 mg via INTRAVENOUS

## 2016-01-10 MED ORDER — PROPOFOL 500 MG/50ML IV EMUL
INTRAVENOUS | Status: DC | PRN
  Administered 2016-01-10: 140 ug/kg/min via INTRAVENOUS

## 2016-01-10 NOTE — Transfer of Care (Signed)
Immediate Anesthesia Transfer of Care Note  Patient: Kenneth Curry  Procedure(s) Performed: Procedure(s): UPPER ESOPHAGEAL ENDOSCOPIC ULTRASOUND (EUS) (N/A)  Patient Location: PACU  Anesthesia Type:General  Level of Consciousness: awake and patient cooperative  Airway & Oxygen Therapy: Patient Spontanous Breathing and Patient connected to nasal cannula oxygen  Post-op Assessment: Report given to RN  Post vital signs: Reviewed and stable  Last Vitals:  Filed Vitals:   01/10/16 1222 01/10/16 1408  BP:  89/55  Pulse:  66  Temp: 35.9 C 36.3 C  Resp: 18 18    Complications: No apparent anesthesia complications

## 2016-01-10 NOTE — Anesthesia Preprocedure Evaluation (Addendum)
Anesthesia Evaluation  Patient identified by MRN, date of birth, ID band Patient awake    Reviewed: Allergy & Precautions, NPO status   History of Anesthesia Complications (+) MALIGNANT HYPERTHERMIA  Airway Mallampati: II       Dental  (+) Teeth Intact   Pulmonary COPD, former smoker,     + decreased breath sounds      Cardiovascular + Peripheral Vascular Disease   Rhythm:Regular     Neuro/Psych    GI/Hepatic Neg liver ROS, GERD  ,  Endo/Other  diabetes, Type 1, Insulin DependentHypothyroidism   Renal/GU negative Renal ROS     Musculoskeletal   Abdominal Normal abdominal exam  (+)   Peds  Hematology   Anesthesia Other Findings   Reproductive/Obstetrics                            Anesthesia Physical Anesthesia Plan  ASA: III  Anesthesia Plan: General   Post-op Pain Management:    Induction: Intravenous  Airway Management Planned: Natural Airway and Nasal Cannula  Additional Equipment:   Intra-op Plan:   Post-operative Plan:   Informed Consent: I have reviewed the patients History and Physical, chart, labs and discussed the procedure including the risks, benefits and alternatives for the proposed anesthesia with the patient or authorized representative who has indicated his/her understanding and acceptance.     Plan Discussed with: CRNA  Anesthesia Plan Comments:         Anesthesia Quick Evaluation

## 2016-01-10 NOTE — H&P (Signed)
Primary Care Physician:  Kenneth Kayser, MD  Pre-Procedure History & Physical: HPI:  Kenneth Curry is a 82 y.o. male is here for an Upper EUS.   Past Medical History  Diagnosis Date  . Abdominal aortic aneurysm ([place])   . Pulmonary nodules   . Thrombocytopenia ([place])   . Presence of IVC filter   . Elevated cholesterol   . Diabetes mellitus, type 2 ([place])   . Cellulitis   . History of primary bladder cancer   . Hematuria   . Urinary frequency   . Phimosis     Past Surgical History  Procedure Laterality Date  . Cholecystectomy    . Cochlear implant    . Appendectomy    . Abdominal aortic aneurysm repair      Prior to Admission medications   Medication Sig Start Date End Date Taking? Authorizing Provider  ACCU-CHEK FASTCLIX LANCETS MISC  03/16/15   Historical Provider, MD  ACCU-CHEK SMARTVIEW test strip  03/16/15   Historical Provider, MD  Acetaminophen 500 MG coapsule  10/11/15   Historical Provider, MD  Blood Glucose Monitoring Suppl (ACCU-CHEK NANO SMARTVIEW) W/DEVICE KIT  03/16/15   Historical Provider, MD  Calcium Carbonate-Vitamin D (CALCIUM-D PO) Take by mouth daily.    Historical Provider, MD  ciprofloxacin (CIPRO) 500 MG tablet Take 1 tablet (500 mg total) by mouth 2 (two) times daily. For 10 days 12/09/15   Marylene Land, NP  enoxaparin (LOVENOX) 100 MG/ML injection Inject 0.9 mLs (90 mg total) into the skin every 12 (twelve) hours. Take as directed by MD (on 01/29, 01/30, and 01/08/2016) then hold prior to procedure.  Restart after procedure when directed by MD 01/03/16   Lequita Asal, MD  glimepiride (AMARYL) 4 MG tablet Take 1 tablet (4 mg) in the morning and 1 tablet (4 mg) in the evening with food. 03/26/15   Historical Provider, MD  Insulin Glargine (LANTUS) 100 UNIT/ML Solostar Pen Inject into the skin. 04/16/15   Historical Provider, MD  levothyroxine (SYNTHROID, LEVOTHROID) 150 MCG tablet Take by mouth. 05/30/14   Historical Provider, MD  lovastatin (MEVACOR) 20 MG tablet  Take by mouth. 11/15/14   Historical Provider, MD  NASAL DECONGESTANT SPRAY 0.05 % nasal spray  11/21/15   Historical Provider, MD  OMEPRAZOLE PO Take 20 mg by mouth daily.    Historical Provider, MD  RELION PEN NEEDLES 29G X 12MM [place]  04/22/15   Historical Provider, MD  sitaGLIPtin (JANUVIA) 100 MG tablet Take by mouth. 01/01/15   Historical Provider, MD  warfarin (COUMADIN) 5 MG tablet Take 5 mg by mouth daily. Take 2 tablets daily ~ 5 days a week Take 2.5 tablets on Weds and Saturdays    Historical Provider, MD    Allergies as of 12/28/2015 - Review Complete 12/19/2015  Allergen Reaction Noted  . Hydrocodone-acetaminophen Other (See Comments) 06/04/2015  . Metformin Diarrhea 06/04/2015  . Atorvastatin Rash 06/04/2015    Family History  Problem Relation Age of Onset  . Lung cancer Brother   . Colon cancer Brother   . Cancer Sister     Renal  . Cancer Mother 35    Kidney  . Cancer Paternal Uncle     melanoma    Social History   Social History  . Marital Status: Married    Spouse Name: N/A  . Number of Children: N/A  . Years of Education: N/A   Occupational History  . Not on file.   Social History Main Topics  .  Smoking status: Former Smoker -- 1.00 packs/day    Types: Cigarettes    Quit date: 12/28/2015  . Smokeless tobacco: Not on file  . Alcohol Use: No  . Drug Use: Not on file  . Sexual Activity: Not on file   Other Topics Concern  . Not on file   Social History Narrative    Review of Systems: See HPI, otherwise negative ROS  Physical Exam: Temp(Src) 96.6 F (35.9 C)  Resp 18  SpO2 97% General:   Alert,  pleasant and cooperative in NAD Head:  Normocephalic and atraumatic. Neck:  Supple; no masses or thyromegaly. Lungs:  Clear throughout to auscultation.    Heart:  Regular rate and rhythm. Abdomen:  Soft, nontender and nondistended. Normal bowel sounds, without guarding, and without rebound.   Neurologic:  Alert and  oriented x4;  grossly normal  neurologically.  Impression/Plan: Kenneth Curry is here for an Upper EUS to be performed for Abnormal CT of Abdomen.  Risks, benefits, limitations, and alternatives regarding  Upper EUS have been reviewed with the patient.  Questions have been answered.  All parties agreeable.   Kenneth Daniels, MD  01/10/2016, 1:01 PM

## 2016-01-10 NOTE — Anesthesia Postprocedure Evaluation (Signed)
Anesthesia Post Note  Patient: Kenneth Curry  Procedure(s) Performed: Procedure(s) (LRB): UPPER ESOPHAGEAL ENDOSCOPIC ULTRASOUND (EUS) (N/A)  Patient location during evaluation: PACU Anesthesia Type: General Level of consciousness: awake and alert Pain management: satisfactory to patient Vital Signs Assessment: post-procedure vital signs reviewed and stable Respiratory status: nonlabored ventilation Cardiovascular status: stable Anesthetic complications: no    Last Vitals:  Filed Vitals:   01/10/16 1428 01/10/16 1438  BP: 103/79 100/77  Pulse: 52 46  Temp:    Resp: 19 17    Last Pain: There were no vitals filed for this visit.               VAN STAVEREN,Jullian Previti

## 2016-01-10 NOTE — Op Note (Signed)
[facility] Gastroenterology Patient Name: Kenneth Curry Procedure Date: 01/10/2016 12:53 PM MRN: [id number] Account #: [id number] Date of Birth: 13-May-1933 Admit Type: Outpatient Age: 82 Room: [facility] ENDO ROOM 3 Gender: Male Note Status: Finalized Procedure:         Upper EUS Indications:       Dilated pancreatic duct on CT scan, Pancreatic cyst on CT                     scan Providers:         Christian Mate. Earlie Counts Referring MD:      Christena Flake. Raechel Ache, MD (Referring MD), Drue Second. Mike Gip                     (Referring MD) Medicines:         Monitored Anesthesia Care, Cipro [id number] mg IV Complications:     No immediate complications. Procedure:         Pre-Anesthesia Assessment:                    - Prior to the procedure, a History and Physical was                     performed, and patient medications, allergies and                     sensitivities were reviewed. The patient's tolerance of                     previous anesthesia was reviewed.                    After obtaining informed consent, the endoscope was passed                     under direct vision. Throughout the procedure, the                     patient's blood pressure, pulse, and oxygen saturations                     were monitored continuously. The EUS GI Linear Array                     [id number] was introduced through the mouth, and advanced to                     the second part of duodenum. The Olympus TJF-[id number]                     duodenoscope (S#. [id number]) was introduced through the                     mouth, and advanced to the second part of duodenum. The                     upper EUS was accomplished without difficulty. The patient                     tolerated the procedure well. Findings:      Endoscopic Finding :      A gaping deformity was found in the ampulla, with extruding mucin.      Endosonographic Finding :      An irregular mass area was identified in the proximal  pancreatic   head/neck, closely abutting the largest cyst. The area was hypoechoic.       The area measured 25 mm by 20 mm in maximal cross-sectional diameter.       The endosonographic borders were poorly-defined. An intact interface was       seen between the area and the superior mesenteric artery and portal vein       suggesting a lack of invasion. The remainder of the pancreas was       examined. The endosonographic appearance of parenchyma and the upstream       pancreatic duct indicated duct dilation, with mural thickening and       nodules out to the tail, and, a maximum duct diameter of 11 mm and       parenchymal atrophy. Fine needle aspiration for cytology was performed       of the somewhat suspicious area. Color Doppler imaging was utilized       prior to needle puncture to confirm a lack of significant vascular       structures within the needle path. Three passes were made with the 22       gauge needle using a transduodenal approach. A stylet was used. A       cytologist was present and performed a preliminary cytologic       examination. Final cytology results are pending.      An anechoic lesion suggestive of a cyst was identified in the pancreatic       head. It is not in obvious communication with the pancreatic duct. The       lesion measured 32 mm by 24 mm in maximal cross-sectional diameter.       There was a single compartment without septae. The outer wall of the       lesion was thin. There was no internal debris within the fluid-filled       cavity. Diagnostic needle aspiration for fluid was performed. Color       Doppler imaging was utilized prior to needle puncture to confirm a lack       of significant vascular structures within the needle path. One pass was       made with the 22 gauge needle using a transduodenal approach. A stylet       was used. The fluid was clear. Sample(s) were sent for amylase       concentration and CEA.      An anechoic lesion suggestive of a cyst  was identified in the genu of       the pancreas. It is not in obvious communication with the pancreatic       duct. The lesion measured 24 mm by 21 mm in maximal cross-sectional       diameter. There was a single compartment thinly septated. The outer wall       of the lesion was thin. There was no associated mass. There was no       internal debris within the fluid-filled cavity.      Endosonographic imaging in the left lobe of the liver showed no mass.      A few benign-appearing lymph nodes were visualized in the porta hepatis       region. The largest measured 14 mm by 7 mm in maximal cross-sectional       diameter. The nodes were oval, isoechoic and had poorly defined margins.      No abnormal-appearing lymph nodes  were seen during endosonographic       examination in the celiac region (level 20). 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                    - A mildly supsicious hypoechoic area was identified in                     the pancreatic head. Given upstream dilation of the duct                     and close association with the larger cyst, fine needle                     aspiration performed. The entire PD in the proximal head                     out to the tail is dilated, thick walled, with mural                     nodularity, suggestive of diffuse main duct IPMN.                    - A cystic lesion was seen in the pancreatic head. Tissue                     was obtained from this exam, and results are pending.                     However, the endosonographic appearance is consistent with                     an intraductal papillary mucinous neoplasm of the main                     duct. Fine needle aspiration for fluid performed.                    - A cystic lesion was seen in the genu of the pancreas.                     Tissue has not been obtained. However, the  endosonographic                     appearance is consistent with an intraductal papillary                     mucinous neoplasm of the main duct.                    - A few benign lymph nodes were visualized in the porta                     hepatis region. Tissue has not been obtained. However, the                     endosonographic appearance is consistent with benign                     inflammatory changes. Recommendation:    - Await cytology results and  await tumor markers.                    - Resume Lovenox (enoxaparin) at prior dose in 3 days.                     Refer to managing physician for further adjustment of                     therapy.                    - Cipro (ciprofloxacin) 500 mg PO BID for 5 days.                    - Patient has a contact number available for emergencies.                     The signs and symptoms of potential delayed complications                     were discussed with the patient. Return to normal                     activities tomorrow. Written discharge instructions were                     provided to the patient.                    - Return to referring physician. Procedure Code(s): --- Professional ---                    [phone], Esophagogastroduodenoscopy, flexible, transoral;                     with transendoscopic ultrasound-guided intramural or                     transmural fine needle aspiration/biopsy(s), (includes                     endoscopic ultrasound examination limited to the                     esophagus, stomach or duodenum, and adjacent structures) Diagnosis Code(s): --- Professional ---                    [id number], Cyst of pancreas                    K86.8, Other specified diseases of pancreas                    K31.89, Other diseases of stomach and duodenum                    I89.9, Noninfective disorder of lymphatic vessels and                     lymph nodes, unspecified CPT copyright 2014 American Medical Association.  All rights reserved. The codes documented in this report are preliminary and upon coder review may  be revised to meet current compliance requirements. Attending Participation:      I personally performed the entire procedure. Lindie Spruce,  01/10/2016 2:34:28 PM This report has been signed electronically. Number of Addenda: 0 Note Initiated On: 01/10/2016 12:53 PM      [place]  [facility]

## 2016-01-11 ENCOUNTER — Encounter: Payer: Self-pay | Admitting: Gastroenterology

## 2016-01-11 LAB — MISC LABCORP TEST (SEND OUT)

## 2016-01-13 ENCOUNTER — Other Ambulatory Visit: Payer: Self-pay | Admitting: Hematology and Oncology

## 2016-01-14 ENCOUNTER — Telehealth: Payer: Self-pay

## 2016-01-14 LAB — CYTOLOGY - NON PAP

## 2016-01-14 NOTE — Telephone Encounter (Signed)
  Oncology Nurse Navigator Documentation  Navigator Location: [facility] (01/14/16 1300)                                            Time Spent with Patient: 15 (01/14/16 1300)   Received called pathology report from EUS performed. 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 These results were sent to Tammi Klippel PA at [facility] with Dr Jackalyn Lombard office. Notified Kelly. Medical oncologist Dr Mike Gip also made aware of these results. Also notified Georgeanne Nim NP who will be seeing Kenneth Curry at [facility] 01/16/16

## 2016-01-15 ENCOUNTER — Other Ambulatory Visit: Payer: Self-pay | Admitting: Family Medicine

## 2016-01-16 ENCOUNTER — Other Ambulatory Visit: Payer: Self-pay

## 2016-01-16 ENCOUNTER — Inpatient Hospital Stay (HOSPITAL_BASED_OUTPATIENT_CLINIC_OR_DEPARTMENT_OTHER): Payer: Commercial Managed Care - HMO | Admitting: Family Medicine

## 2016-01-16 ENCOUNTER — Ambulatory Visit: Payer: Commercial Managed Care - HMO

## 2016-01-16 ENCOUNTER — Other Ambulatory Visit: Payer: Self-pay | Admitting: Hematology and Oncology

## 2016-01-16 ENCOUNTER — Inpatient Hospital Stay: Payer: Commercial Managed Care - HMO

## 2016-01-16 ENCOUNTER — Encounter: Payer: Self-pay | Admitting: Family Medicine

## 2016-01-16 VITALS — BP 97/50 | HR 78 | Resp 18

## 2016-01-16 VITALS — BP 106/57 | HR 77 | Temp 98.3°F | Resp 18 | Ht 68.0 in | Wt 204.3 lb

## 2016-01-16 DIAGNOSIS — E119 Type 2 diabetes mellitus without complications: Secondary | ICD-10-CM

## 2016-01-16 DIAGNOSIS — C9110 Chronic lymphocytic leukemia of B-cell type not having achieved remission: Secondary | ICD-10-CM | POA: Diagnosis not present

## 2016-01-16 DIAGNOSIS — R319 Hematuria, unspecified: Secondary | ICD-10-CM

## 2016-01-16 DIAGNOSIS — N471 Phimosis: Secondary | ICD-10-CM

## 2016-01-16 DIAGNOSIS — R918 Other nonspecific abnormal finding of lung field: Secondary | ICD-10-CM

## 2016-01-16 DIAGNOSIS — R161 Splenomegaly, not elsewhere classified: Secondary | ICD-10-CM

## 2016-01-16 DIAGNOSIS — K859 Acute pancreatitis without necrosis or infection, unspecified: Secondary | ICD-10-CM | POA: Diagnosis not present

## 2016-01-16 DIAGNOSIS — F17210 Nicotine dependence, cigarettes, uncomplicated: Secondary | ICD-10-CM

## 2016-01-16 DIAGNOSIS — R109 Unspecified abdominal pain: Secondary | ICD-10-CM

## 2016-01-16 DIAGNOSIS — D696 Thrombocytopenia, unspecified: Secondary | ICD-10-CM

## 2016-01-16 DIAGNOSIS — Z8551 Personal history of malignant neoplasm of bladder: Secondary | ICD-10-CM

## 2016-01-16 DIAGNOSIS — C259 Malignant neoplasm of pancreas, unspecified: Secondary | ICD-10-CM | POA: Diagnosis not present

## 2016-01-16 DIAGNOSIS — E7800 Pure hypercholesterolemia, unspecified: Secondary | ICD-10-CM | POA: Diagnosis not present

## 2016-01-16 DIAGNOSIS — Z86718 Personal history of other venous thrombosis and embolism: Secondary | ICD-10-CM

## 2016-01-16 HISTORY — DX: Malignant neoplasm of pancreas, unspecified: C25.9

## 2016-01-16 LAB — COMPREHENSIVE METABOLIC PANEL
ALBUMIN: 3.8 g/dL (ref 3.5–5.0)
ALT: 390 U/L — ABNORMAL HIGH (ref 17–63)
ANION GAP: 6 (ref 5–15)
AST: 472 U/L — ABNORMAL HIGH (ref 15–41)
Alkaline Phosphatase: 106 U/L (ref 38–126)
BILIRUBIN TOTAL: 3.9 mg/dL — AB (ref 0.3–1.2)
BUN: 17 mg/dL (ref 6–20)
CALCIUM: 9.3 mg/dL (ref 8.9–10.3)
CO2: 29 mmol/L (ref 22–32)
Chloride: 104 mmol/L (ref 101–111)
Creatinine, Ser: 0.93 mg/dL (ref 0.61–1.24)
GFR calc non Af Amer: >60 mL/min (ref >60)
GLUCOSE: 135 mg/dL — AB (ref 65–99)
POTASSIUM: 4 mmol/L (ref 3.5–5.1)
SODIUM: 139 mmol/L (ref 135–145)
TOTAL PROTEIN: 6.9 g/dL (ref 6.5–8.1)

## 2016-01-16 LAB — CBC WITH DIFFERENTIAL/PLATELET
Basophils Absolute: 0.1 10*3/uL (ref 0–0.1)
EOS ABS: 0 10*3/uL (ref 0–0.7)
Eosinophils Relative: 0 %
HCT: 41.2 % (ref 40.0–52.0)
Hemoglobin: 13.5 g/dL (ref 13.0–18.0)
LYMPHS ABS: 6.3 10*3/uL — AB (ref 1.0–3.6)
Lymphocytes Relative: 45 %
MCH: 27.6 pg (ref 26.0–34.0)
MCHC: 32.8 g/dL (ref 32.0–36.0)
MCV: 84 fL (ref 80.0–100.0)
Monocytes Absolute: 0.9 10*3/uL (ref 0.2–1.0)
Neutro Abs: 6.6 10*3/uL — ABNORMAL HIGH (ref 1.4–6.5)
Neutrophils Relative %: 47 %
Platelets: 91 10*3/uL — ABNORMAL LOW (ref 150–440)
RBC: 4.91 MIL/uL (ref 4.40–5.90)
RDW: 15.3 % — AB (ref 11.5–14.5)
WBC: 13.9 10*3/uL — ABNORMAL HIGH (ref 3.8–10.6)

## 2016-01-16 MED ORDER — OXYCODONE HCL 5 MG PO TABS: 5.0000 mg | ORAL_TABLET | ORAL | Status: DC | PRN

## 2016-01-16 MED ORDER — SODIUM CHLORIDE 0.9 % IV SOLN
Freq: Once | INTRAVENOUS | Status: DC
  Filled 2016-01-16: qty 4

## 2016-01-16 MED ORDER — SODIUM CHLORIDE 0.9 % IV SOLN
Freq: Once | INTRAVENOUS | Status: DC
  Filled 2016-01-16: qty 1000

## 2016-01-16 MED ORDER — MORPHINE SULFATE 4 MG/ML IJ SOLN
2.0000 mg | Freq: Once | INTRAMUSCULAR | Status: AC
  Administered 2016-01-16: 2 mg via INTRAVENOUS

## 2016-01-16 MED ORDER — SODIUM CHLORIDE 0.9 % IV SOLN
Freq: Once | INTRAVENOUS | Status: AC
  Administered 2016-01-16: 11:00:00 via INTRAVENOUS
  Filled 2016-01-16: qty 1000

## 2016-01-16 MED ORDER — SODIUM CHLORIDE 0.9 % IV SOLN
Freq: Once | INTRAVENOUS | Status: AC
  Administered 2016-01-16: 11:00:00 via INTRAVENOUS
  Filled 2016-01-16: qty 4

## 2016-01-16 MED ORDER — MORPHINE SULFATE (PF) 2 MG/ML IV SOLN
2.0000 mg | Freq: Once | INTRAVENOUS | Status: DC
  Filled 2016-01-16: qty 1

## 2016-01-16 NOTE — Progress Notes (Signed)
Pt here for results.  Pt reports last night having N/V no loose stools, with the n/v he reports a soreness in the abdominal area in general hurts to press on abdominal area.  With the n/v last night he reports having a tightness type feeling in chest accompanied by SOB.  He has some dizziness when standing.  Pt reports a pain in his back also.  Asides from the dizziness everything started 2pm on 01/15/2016.  Dizziness has been a issue at points in the past.

## 2016-01-16 NOTE — Progress Notes (Signed)
[facility] day:  01/02/2016  Chief Complaint: Kenneth Curry is a 82 y.o. male with CLL, a history of bladder tumor, and left lower extremity DVT who is seen for Coumadin management prior to an upcoming ERCP/endoscopic ultrasound.  HPI:   The patient  was last seen in the medical oncology clinic on 12/19/2015.  At that time, he was seen for initial assessment by me.  He was noted to have CLL with associated thrombocytopenia, a history of bladder cancer, and a left lower extremity DVT.  He was previously seen by Dr. Inez Pilgrim.  We discussed ongoing surveillance of his CLL and superficial bladder cancer.  INR was sub-therapeutic on Coumadin 10 mg a day.  Dose was increased to 10 mg 5 days a week and 12.5 mg 2 days a week (total weekly dose: 75 mg).  His INR has been followed.  INR was  2.28 on 12/25/2015.  At last visit, he noted recent pancreatic problems after presenting with acute abdominal pain.  Abdominal and pelvic CT scan without contrast on 12/09/2015 revealed pancreatic ductal dilatation within the body and tail of the pancreas with pancreatic atrophy (new since 2010).  There was a 3.2 x 2.7 cm cystic structure adjacent to the pancreatic head, as well as a 1 x 1.6 cm cystic structure/mass along  the medial uncinate process.  He was initially considered for an MRCP, but notes by Dr Rayann Heman of gastroenterology documented a contraindication secondary to right cochlear implant and IVC filter.  He is scheduled for EUS on 01/10/2016 by Dr. Earlie Counts at [facility].  Per Mariea Clonts, nurse navigator, he requires holding his Coumadin 5 days prior to the procedure.  On evaluation today patient complaining of abdominal pain 9/10 that radiates to his back, nausea, and vomiting. Wife states that he has been slipping on water but overall has not had anything to eat since Tuesday at approximately 3 PM when the vomiting and nausea started. Patient did have an EUS on 01/10/2016 with  Dr. Earlie Counts. Pathology confirmed adenocarcinoma in a background of intraductal papillary mucinous neoplasm.   Past Medical History  Diagnosis Date  . Abdominal aortic aneurysm ([place])   . Pulmonary nodules   . Thrombocytopenia ([place])   . Presence of IVC filter   . Elevated cholesterol   . Diabetes mellitus, type 2 ([place])   . Cellulitis   . History of primary bladder cancer   . Hematuria   . Urinary frequency   . Phimosis   . Pancreatic adenocarcinoma ([place]) 01/16/2016    Past Surgical History  Procedure Laterality Date  . Cholecystectomy    . Cochlear implant    . Appendectomy    . Abdominal aortic aneurysm repair    . Upper esophageal endoscopic ultrasound (eus) N/A 01/10/2016    Procedure: UPPER ESOPHAGEAL ENDOSCOPIC ULTRASOUND (EUS);  Surgeon: Cora Daniels, MD;  Location: [facility] ENDOSCOPY;  Service: Endoscopy;  Laterality: N/A;    Family History  Problem Relation Age of Onset  . Lung cancer Brother   . Colon cancer Brother   . Cancer Sister     Renal  . Cancer Mother 62    Kidney  . Cancer Paternal Uncle     melanoma    Social History:  reports that he quit smoking about 2 weeks ago. His smoking use included Cigarettes. He smoked 1.00 pack per day. He does not have any smokeless tobacco history on file. He reports that he does not drink  alcohol. His drug history is not on file.  He previously smoked < 1/2 pack per day.  He stopped smoking on 12/28/2015.  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  The patient is accompanied by his wife today.  Allergies:  Allergies  Allergen Reactions  . Hydrocodone-Acetaminophen Other (See Comments)    Other reaction(s): Other (See Comments) intolerate intolerate  . Metformin Diarrhea  . Atorvastatin Rash    Current Medications: Current Outpatient Prescriptions  Medication Sig Dispense Refill  . ACCU-CHEK FASTCLIX LANCETS MISC     . ACCU-CHEK SMARTVIEW test strip     . Acetaminophen 500 MG coapsule     . Blood Glucose  Monitoring Suppl (ACCU-CHEK NANO SMARTVIEW) W/DEVICE KIT     . Calcium Carbonate-Vitamin D (CALCIUM-D PO) Take by mouth daily.    Marland Kitchen enoxaparin (LOVENOX) 100 MG/ML injection Inject 0.9 mLs (90 mg total) into the skin every 12 (twelve) hours. Take as directed by MD (on 01/29, 01/30, and 01/08/2016) then hold prior to procedure.  Restart after procedure when directed by MD 10 Syringe 2  . glimepiride (AMARYL) 4 MG tablet Take 1 tablet (4 mg) in the morning and 1 tablet (4 mg) in the evening with food.    . Insulin Glargine (LANTUS) 100 UNIT/ML Solostar Pen Inject into the skin.    Marland Kitchen levothyroxine (SYNTHROID, LEVOTHROID) 150 MCG tablet Take by mouth.    . lovastatin (MEVACOR) 20 MG tablet Take by mouth.    Marland Kitchen NASAL DECONGESTANT SPRAY 0.05 % nasal spray     . OMEPRAZOLE PO Take 20 mg by mouth daily.    . promethazine (PHENERGAN) 25 MG tablet     . RELION PEN NEEDLES 29G X 12MM MISC     . sitaGLIPtin (JANUVIA) 100 MG tablet Take by mouth.    . warfarin (COUMADIN) 5 MG tablet Take 5 mg by mouth daily. Take 2 tablets daily ~ 5 days a week Take 2.5 tablets on Weds and Saturdays    . oxyCODONE (OXY IR/ROXICODONE) 5 MG immediate release tablet Take 1 tablet (5 mg total) by mouth every 4 (four) hours as needed for severe pain. 30 tablet 0   Current Facility-Administered Medications  Medication Dose Route Frequency Provider Last Rate Last Dose  . 0.9 %  sodium chloride infusion   Intravenous Once Evlyn Kanner, NP      . morphine 2 MG/ML injection 2 mg  2 mg Intravenous Once Evlyn Kanner, NP      . ondansetron (ZOFRAN) 8 mg in sodium chloride 0.9 % 50 mL IVPB   Intravenous Once Evlyn Kanner, NP        Review of Systems:  GENERAL:  Feels good.  Active.  No fevers, sweats or weight loss. PERFORMANCE STATUS (ECOG):  1 HEENT:  No visual changes, runny nose, sore throat, mouth sores or tenderness. Lungs: No shortness of breath or cough.  No hemoptysis. Cardiac:  No chest pain, palpitations,  orthopnea, or PND. GI:  9/10 abdominal pain that radiates to the back and into the chest.  Also having severe nausea and vomiting since yesterday approximately 3 PM. No melena or hematochezia. GU:  No urgency, frequency, dysuria, or hematuria. Musculoskeletal: No joint pain.  No muscle tenderness. Extremities:  No pain or swelling. Skin:  No rashes or skin changes. Neuro:  1-2 week history of dizziness.  No headache, numbness or weakness, balance or coordination issues. Endocrine:  Diabetes.  Thyroid disease on Synthroid.  No hot  flashes or night sweats. Psych:  No mood changes, depression or anxiety. Pain:  9 of 10 and abdomen radiating to back and chest. Review of systems:  All other systems reviewed and found to be negative.  Physical Exam: Blood pressure 106/57, pulse 77, temperature 98.3 F (36.8 C), temperature source Tympanic, resp. rate 18, height 5' 8"  (1.727 m), weight 204 lb 4.1 oz (92.65 kg). GENERAL:  Well developed, well nourished, in mild acute distress due to pain and nausea. MENTAL STATUS:  Alert and oriented to person, place and time. HEAD:  Short white hair.  Normocephalic, atraumatic, face symmetric, no Cushingoid features. EYES:  Blue eyes.  Pupils equal round and reactive to light and accomodation.  No conjunctivitis or scleral icterus. ENT:  Right cochlear implant.  Oropharynx clear without lesion.  Tongue normal. Mucous membranes moist.  RESPIRATORY:  Clear to auscultation without rales, wheezes or rhonchi. CARDIOVASCULAR:  Bradycardia.  Intermittently irregular rate and rhythm .  No murmur, rub or gallop. ABDOMEN:  Tender to palpation, active bowel sounds, and no hepatomegaly.  SKIN:  No rashes, ulcers or lesions. EXTREMITIES: Slight left lower extremity edema (no change).  No skin discoloration or tenderness.  No palpable cords. LYMPH NODES: No palpable cervical, supraclavicular, axillary or inguinal adenopathy  NEUROLOGICAL: Unremarkable. PSYCH:   Appropriate.  No visits with results within 3 Day(s) from this visit. Latest known visit with results is:  Admission on 01/10/2016, Discharged on 01/10/2016  Component Date Value Ref Range Status  . Glucose-Capillary 01/10/2016 107* 65 - 99 mg/dL Final  . CYTOLOGY - NON GYN 01/10/2016    Final                   Value:Cytology - Non PAP CASE: [id number] PATIENT: Kenneth Curry Non-Gyn Cytology Report     SPECIMEN SUBMITTED: A. Head of pancreas  CLINICAL HISTORY: Dilated pancreatic duct and cyst on CT scan  PRE-OPERATIVE DIAGNOSIS: Pancreatic cyst  POST-OPERATIVE DIAGNOSIS: Gaping papilla with extruding mucin and irregular mass area was identified in the proximal pancreatic head/neck, closely abutting the largest cyst     DIAGNOSIS: A. HEAD OF PANCREAS; EUS GUIDED FNA: - SATISFACTORY FOR EVALUATION. - POSITIVE FOR MALIGNANCY. - ADENOCARCINOMA IN A BACKGROUND OF INTRADUCTAL PAPILLARY MUCINOUS NEOPLASM.  Comment: Cyst fluid: amylase <5 U/L; CEA 827.5 ng/mL The CEA result of the cyst fluid is consistent with a background intraductal papillary mucinous neoplasm. These findings were communicated to Mariea Clonts, GI Coordinator on 01/14/2016. Read back procedure was performed.    GROSS DESCRIPTION: A. Site: Head of pancreas Procedure: EUS Cytotechnologist: Zyre                         tta Araceli Bouche, Molli Barrows Specimen(s) collected: 3 Diff Quik stained slides 3 Pap stained slides      Volume: 40 mL      Description: Pink red fluid with a small amount soft material      Submitted for:           ThinPrep           Cell block(s): 1   Final Diagnosis performed by Quay Burow, MD.  Electronically signed 01/14/2016 10:08:11AM    The electronic signature indicates that the named Attending Pathologist has evaluated the specimen  Technical component performed at [facility], [address] [place], [place] [address] Lab: [phone] Dir: Darrick Penna. Evette Doffing,  MD  Professional component performed at [facility], [facility], [place],  [place] [address] Lab: [phone] Dir: Dellia Nims. Rubinas, MD    . Amylase, Fluid 01/10/2016 <5   Final   NO NORMAL RANGE ESTABLISHED FOR THIS TEST  . Fluid Type-FAMY 01/10/2016 FLUID   Final  . [facility] test code 01/10/2016 FLUID   Final  . [facility] test name 01/10/2016 FLUID   Final  . Misc [facility] result 01/10/2016 COMMENT   Final   Comment: (NOTE) Test Ordered: [id number] CEA, Fluid CEA, Fluid                     827.5            ng/mL    BN     Reference Range: Not Estab.                            Roche ECLIA methodology The reference intervals and other method performance specifications have not been established for the test in body fluid. The test result should be integrated into the clinical context for interpretation. Performed At: [facility] [address] [place], [place] [id number] Lindon Romp MD VE:[id number]     Assessment:  Kenneth Curry is a 82 y.o. 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  He was diagnosed with CLL in 1994 or 1996. He has splenomegaly and chronic thrombocytopenia (100,000-120,000). He had 2 bone marrow biopsies (one at [facility] with Dr Otelia Limes). He has not required treatment. He has had no B symptoms, adenopathy, bruising or bleeding. He has had no recurrent infections.   He has a history of superficial bladder cancer in 05/2014 after presenting with hematuria. He underwent TURBT. He has been followed by Dr Elnoria Howard, urologist. His last cystoscopy on 10/23/2015 was negative.  He has a history of recurrent left DVT x 2-3. He was diagnosed with an apparent pulmonary embolism in 08/2009. "years ago". He was diagnosed with a left lower extremity DVT (popliteal thrombosis, non-occlusive) with a therapeutic INR (3.6) An IVC filter was  placed. He was briefly on Lovenox, but then refused Lovenox, Arixtra, and Xarelto. He has been adamant to only receive Coumadin. He is on Coumadin 10 mg a day 5 days a week and 12.5 mg 2 days a week (total weekly dose 75 mg).  He has a history of pulmonary nodules in 2002. He notes a recent history of pancreatic problems. He presented with acute abdominal pain . Abdominal and pelvic CT scan without contrast on 12/09/2015 revealed pancreatic ductal dilatation within the body and tail of the pancreas with pancreatic atrophy (new since 2010). There was a 3.2 x 2.7 cm cystic structure adjacent to the pancreatic head, as well as a 1 x 1.6 cm cystic structure/mass along the medial uncinate process.   Patient had EUS on 10/09/2016 with Dr. Earlie Counts. Pathology confirms adenocarcinoma in the presence of IPMN. 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 Patient started with pain at 3 PM on Tuesday afternoon.   Plan: 1. Patient is currently on Lovenox injections subcutaneously twice a day. Advised patient to restart Coumadin at 10 mg today. We will plan to recheck PT/INR on Friday. 2. Labs today:  CA 19-9, CEA, CMP, CBC with differential 3. Discussed that bilirubin was 3.9 and liver function testing was also abnormally high. We'll attempt to arrange for an outpatient GI consult  as patient does not want to go to the hospital. 4. Discussed with patient and family pathology results. I also explained that the symptoms he is having is most likely related to pancreatitis secondary to pancreatic cancer. Advised patient that he should most likely be evaluated in the ED but he refused. Family is in agreement that if the patient becomes more nauseous or is having increasing pain that they will either bring him to the ED or call 911. 5. IV fluids were given in office today as well as 8 mg of Zofran and 2 mg of morphine for  pain. Prescription for 5 mg oxycodone every 6 hours as needed was given to wife for patient to have at home. He already has promethazine for nausea. 6. We will schedule a PET scan for initial staging of pancreatic adenocarcinoma. Patient to follow-up with Dr. Mike Gip after completion of PET scan.   Evlyn Kanner, NP 01/16/2016

## 2016-01-17 ENCOUNTER — Encounter: Payer: Self-pay | Admitting: Family Medicine

## 2016-01-17 ENCOUNTER — Other Ambulatory Visit: Payer: Self-pay

## 2016-01-17 ENCOUNTER — Inpatient Hospital Stay
Admission: EM | Admit: 2016-01-17 | Discharge: 2016-01-19 | DRG: 439 | Disposition: A | Discharge status: Home / Self Care | Payer: Commercial Managed Care - HMO | Attending: Specialist | Admitting: Specialist

## 2016-01-17 ENCOUNTER — Encounter: Payer: Self-pay | Admitting: Specialist

## 2016-01-17 ENCOUNTER — Encounter: Payer: Self-pay | Admitting: Emergency Medicine

## 2016-01-17 ENCOUNTER — Emergency Department: Payer: Commercial Managed Care - HMO

## 2016-01-17 DIAGNOSIS — Z885 Allergy status to narcotic agent status: Secondary | ICD-10-CM

## 2016-01-17 DIAGNOSIS — E7800 Pure hypercholesterolemia, unspecified: Secondary | ICD-10-CM | POA: Diagnosis present

## 2016-01-17 DIAGNOSIS — D696 Thrombocytopenia, unspecified: Secondary | ICD-10-CM | POA: Diagnosis not present

## 2016-01-17 DIAGNOSIS — K219 Gastro-esophageal reflux disease without esophagitis: Secondary | ICD-10-CM | POA: Diagnosis present

## 2016-01-17 DIAGNOSIS — K869 Disease of pancreas, unspecified: Secondary | ICD-10-CM | POA: Diagnosis not present

## 2016-01-17 DIAGNOSIS — M1612 Unilateral primary osteoarthritis, left hip: Secondary | ICD-10-CM | POA: Diagnosis present

## 2016-01-17 DIAGNOSIS — K859 Acute pancreatitis without necrosis or infection, unspecified: Secondary | ICD-10-CM | POA: Diagnosis not present

## 2016-01-17 DIAGNOSIS — Z86718 Personal history of other venous thrombosis and embolism: Secondary | ICD-10-CM | POA: Diagnosis not present

## 2016-01-17 DIAGNOSIS — Z8551 Personal history of malignant neoplasm of bladder: Secondary | ICD-10-CM | POA: Diagnosis not present

## 2016-01-17 DIAGNOSIS — C259 Malignant neoplasm of pancreas, unspecified: Secondary | ICD-10-CM | POA: Diagnosis not present

## 2016-01-17 DIAGNOSIS — Z888 Allergy status to other drugs, medicaments and biological substances status: Secondary | ICD-10-CM

## 2016-01-17 DIAGNOSIS — M47817 Spondylosis without myelopathy or radiculopathy, lumbosacral region: Secondary | ICD-10-CM | POA: Diagnosis present

## 2016-01-17 DIAGNOSIS — I714 Abdominal aortic aneurysm, without rupture: Secondary | ICD-10-CM | POA: Diagnosis not present

## 2016-01-17 DIAGNOSIS — K858 Other acute pancreatitis without necrosis or infection: Secondary | ICD-10-CM | POA: Diagnosis not present

## 2016-01-17 DIAGNOSIS — C9110 Chronic lymphocytic leukemia of B-cell type not having achieved remission: Secondary | ICD-10-CM | POA: Diagnosis not present

## 2016-01-17 DIAGNOSIS — E039 Hypothyroidism, unspecified: Secondary | ICD-10-CM | POA: Diagnosis present

## 2016-01-17 DIAGNOSIS — C250 Malignant neoplasm of head of pancreas: Secondary | ICD-10-CM | POA: Diagnosis not present

## 2016-01-17 DIAGNOSIS — E119 Type 2 diabetes mellitus without complications: Secondary | ICD-10-CM | POA: Diagnosis not present

## 2016-01-17 LAB — CBC
HEMATOCRIT: 38 % — AB (ref 40.0–52.0)
HEMOGLOBIN: 12.7 g/dL — AB (ref 13.0–18.0)
MCH: 27.9 pg (ref 26.0–34.0)
MCHC: 33.3 g/dL (ref 32.0–36.0)
MCV: 83.6 fL (ref 80.0–100.0)
Platelets: 78 10*3/uL — ABNORMAL LOW (ref 150–440)
RBC: 4.55 MIL/uL (ref 4.40–5.90)
RDW: 16 % — ABNORMAL HIGH (ref 11.5–14.5)
WBC: 13 10*3/uL — ABNORMAL HIGH (ref 3.8–10.6)

## 2016-01-17 LAB — COMPREHENSIVE METABOLIC PANEL
ALBUMIN: 3.6 g/dL (ref 3.5–5.0)
ALT: 239 U/L — ABNORMAL HIGH (ref 17–63)
ANION GAP: 8 (ref 5–15)
AST: 115 U/L — ABNORMAL HIGH (ref 15–41)
Alkaline Phosphatase: 105 U/L (ref 38–126)
BUN: 18 mg/dL (ref 6–20)
CHLORIDE: 105 mmol/L (ref 101–111)
CO2: 27 mmol/L (ref 22–32)
Calcium: 8.9 mg/dL (ref 8.9–10.3)
Creatinine, Ser: 1.03 mg/dL (ref 0.61–1.24)
GFR calc non Af Amer: >60 mL/min (ref >60)
GLUCOSE: 187 mg/dL — AB (ref 65–99)
Potassium: 4 mmol/L (ref 3.5–5.1)
SODIUM: 140 mmol/L (ref 135–145)
Total Bilirubin: 1.8 mg/dL — ABNORMAL HIGH (ref 0.3–1.2)
Total Protein: 6.5 g/dL (ref 6.5–8.1)

## 2016-01-17 LAB — GLUCOSE, CAPILLARY
GLUCOSE-CAPILLARY: 70 mg/dL (ref 65–99)
Glucose-Capillary: 120 mg/dL — ABNORMAL HIGH (ref 65–99)

## 2016-01-17 LAB — URINALYSIS COMPLETE WITH MICROSCOPIC (ARMC ONLY)
BACTERIA UA: NONE SEEN
Bilirubin Urine: NEGATIVE
Glucose, UA: NEGATIVE mg/dL
Ketones, ur: NEGATIVE mg/dL
LEUKOCYTES UA: NEGATIVE
NITRITE: NEGATIVE
PROTEIN: NEGATIVE mg/dL
SPECIFIC GRAVITY, URINE: 1.013 (ref 1.005–1.030)
pH: 5 (ref 5.0–8.0)

## 2016-01-17 LAB — PROTIME-INR
INR: 1.21
PROTHROMBIN TIME: 15.5 s — AB (ref 11.4–15.0)

## 2016-01-17 LAB — LIPASE, BLOOD: LIPASE: 20 U/L (ref 11–51)

## 2016-01-17 LAB — CANCER ANTIGEN 19-9: CA 19 9: 287 U/mL — AB (ref 0–35)

## 2016-01-17 LAB — CEA: CEA: 5.6 ng/mL — ABNORMAL HIGH (ref 0.0–4.7)

## 2016-01-17 MED ORDER — MORPHINE SULFATE (PF) 2 MG/ML IV SOLN
2.0000 mg | INTRAVENOUS | Status: DC | PRN
  Administered 2016-01-17 – 2016-01-18 (×4): 2 mg via INTRAVENOUS
  Filled 2016-01-17 (×4): qty 1

## 2016-01-17 MED ORDER — ENOXAPARIN SODIUM 100 MG/ML ~~LOC~~ SOLN
90.0000 mg | Freq: Two times a day (BID) | SUBCUTANEOUS | Status: DC
  Administered 2016-01-17 – 2016-01-19 (×4): 90 mg via SUBCUTANEOUS
  Filled 2016-01-17 (×6): qty 1

## 2016-01-17 MED ORDER — ACETAMINOPHEN 650 MG RE SUPP: 650.0000 mg | Freq: Four times a day (QID) | RECTAL | Status: DC | PRN

## 2016-01-17 MED ORDER — MORPHINE SULFATE (PF) 4 MG/ML IV SOLN
INTRAVENOUS | Status: AC
  Administered 2016-01-17: 4 mg via INTRAVENOUS
  Filled 2016-01-17: qty 1

## 2016-01-17 MED ORDER — ONDANSETRON HCL 4 MG/2ML IJ SOLN
4.0000 mg | Freq: Once | INTRAMUSCULAR | Status: AC
  Administered 2016-01-17: 4 mg via INTRAVENOUS
  Filled 2016-01-17: qty 2

## 2016-01-17 MED ORDER — IOHEXOL 240 MG/ML SOLN
25.0000 mL | Freq: Once | INTRAMUSCULAR | Status: AC | PRN
  Administered 2016-01-17: 25 mL via ORAL

## 2016-01-17 MED ORDER — MORPHINE SULFATE (PF) 4 MG/ML IV SOLN
4.0000 mg | Freq: Once | INTRAVENOUS | Status: AC
  Administered 2016-01-17: 4 mg via INTRAVENOUS
  Filled 2016-01-17: qty 1

## 2016-01-17 MED ORDER — CALCIUM CARBONATE-VITAMIN D 500-200 MG-UNIT PO TABS
ORAL_TABLET | Freq: Every day | ORAL | Status: DC
  Administered 2016-01-18 – 2016-01-19 (×2): 1 via ORAL
  Filled 2016-01-17 (×2): qty 1

## 2016-01-17 MED ORDER — INSULIN ASPART 100 UNIT/ML ~~LOC~~ SOLN: 0.0000 [IU] | Freq: Every day | SUBCUTANEOUS | Status: DC

## 2016-01-17 MED ORDER — SODIUM CHLORIDE 0.9 % IV BOLUS (SEPSIS)
1000.0000 mL | Freq: Once | INTRAVENOUS | Status: AC
  Administered 2016-01-17: 1000 mL via INTRAVENOUS

## 2016-01-17 MED ORDER — ONDANSETRON HCL 4 MG PO TABS: 4.0000 mg | ORAL_TABLET | Freq: Four times a day (QID) | ORAL | Status: DC | PRN

## 2016-01-17 MED ORDER — ACETAMINOPHEN 325 MG PO TABS
650.0000 mg | ORAL_TABLET | Freq: Four times a day (QID) | ORAL | Status: DC | PRN
Start: 2016-01-17 — End: 2016-01-19

## 2016-01-17 MED ORDER — MORPHINE SULFATE (PF) 4 MG/ML IV SOLN
4.0000 mg | Freq: Once | INTRAVENOUS | Status: AC
  Administered 2016-01-17: 4 mg via INTRAVENOUS

## 2016-01-17 MED ORDER — IOHEXOL 300 MG/ML  SOLN
100.0000 mL | Freq: Once | INTRAMUSCULAR | Status: AC | PRN
Start: 2016-01-17 — End: 2016-01-17
  Administered 2016-01-17: 100 mL via INTRAVENOUS

## 2016-01-17 MED ORDER — LEVOTHYROXINE SODIUM 150 MCG PO TABS
150.0000 ug | ORAL_TABLET | Freq: Every day | ORAL | Status: DC
  Administered 2016-01-18 – 2016-01-19 (×2): 150 ug via ORAL
  Filled 2016-01-17 (×2): qty 1

## 2016-01-17 MED ORDER — ONDANSETRON HCL 4 MG/2ML IJ SOLN: 4.0000 mg | Freq: Four times a day (QID) | INTRAMUSCULAR | Status: DC | PRN

## 2016-01-17 MED ORDER — SODIUM CHLORIDE 0.9 % IV SOLN
INTRAVENOUS | Status: DC
  Administered 2016-01-17 – 2016-01-19 (×5): via INTRAVENOUS

## 2016-01-17 MED ORDER — INSULIN ASPART 100 UNIT/ML ~~LOC~~ SOLN
0.0000 [IU] | Freq: Three times a day (TID) | SUBCUTANEOUS | Status: DC
  Administered 2016-01-18: 1 [IU] via SUBCUTANEOUS
  Administered 2016-01-19: 12:00:00 3 [IU] via SUBCUTANEOUS
  Filled 2016-01-17: qty 1
  Filled 2016-01-17: qty 3

## 2016-01-17 NOTE — ED Notes (Addendum)
Patient complaining of abdominal pain with nausea and vomiting since Tuesday. States "its not the flu. It feels like something else." States the pain in radiating to his back and also that he has burning with urination. Was seen at [facility] yesterday and given fluids and morphine with no relief. Pt states he was recently diagnoised with pancreatic cancer, PET scan scheduled for next week, states hx of DM, abd swollen, wife at bedside

## 2016-01-17 NOTE — Progress Notes (Deleted)
Kenneth Curry at [place] NAME: Kenneth Curry    MR#:  [id number]  DATE OF BIRTH:  [birth year]  DATE OF ADMISSION:  01/17/2016  PRIMARY CARE PHYSICIAN: Ezequiel Kayser, MD   REQUESTING/REFERRING PHYSICIAN: Dr. Delman Kitten  CHIEF COMPLAINT:   Chief Complaint  Patient presents with  . Abdominal Pain  N/V.   HISTORY OF PRESENT ILLNESS:  Kenneth Curry  is a 82 y.o. male with a known history of abdominal aortic aneurysm, history of previous DVT status post IVC filter, diabetes, history of bladder cancer, pancreatic mass suspicious to be malignant, thrombocytopenia who presents to the hospital due to abdominal pain nausea and vomiting ongoing for the past 2 days. Patient underwent a endoscopic ultrasound and biopsy for the pancreatic mass about a week ago. He was doing well until the past 2 days he started developing worsening abdominal pain which was diffuse in nature and radiating to his back, it was also associated with nausea and vomiting. He has had poor by mouth intake over the past 2 days. He went to urgent care in [facility] yesterday and are treated him supportively with IV fluids and pain medications and he improved and he was discharged home. His morphine wore off and his pain recurred and therefore he presented to the ER for further evaluation today. Patient underwent a CT scan of the abdomen and pelvis which showed evidence of acute pancreatitis and hospitalist services were contacted further treatment and evaluation.  PAST MEDICAL HISTORY:   Past Medical History  Diagnosis Date  . Abdominal aortic aneurysm ([place])   . Pulmonary nodules   . Thrombocytopenia ([place])   . Presence of IVC filter   . Elevated cholesterol   . Diabetes mellitus, type 2 ([place])   . Cellulitis   . History of primary bladder cancer   . Hematuria   . Urinary frequency   . Phimosis   . Pancreatic adenocarcinoma ([place]) 01/16/2016    PAST SURGICAL HISTORY:   Past Surgical  History  Procedure Laterality Date  . Cholecystectomy    . Cochlear implant    . Appendectomy    . Abdominal aortic aneurysm repair    . Upper esophageal endoscopic ultrasound (eus) N/A 01/10/2016    Procedure: UPPER ESOPHAGEAL ENDOSCOPIC ULTRASOUND (EUS);  Surgeon: Cora Daniels, MD;  Location: [facility] ENDOSCOPY;  Service: Endoscopy;  Laterality: N/A;    SOCIAL HISTORY:   Social History  Substance Use Topics  . Smoking status: Former Smoker -- 1.00 packs/day    Types: Cigarettes    Quit date: 12/28/2015  . Smokeless tobacco: Not on file  . Alcohol Use: No    FAMILY HISTORY:   Family History  Problem Relation Age of Onset  . Lung cancer Brother   . Colon cancer Brother   . Cancer Sister     Renal  . Cancer Mother 17    Kidney  . Cancer Paternal Uncle     melanoma    DRUG ALLERGIES:   Allergies  Allergen Reactions  . Hydrocodone-Acetaminophen Other (See Comments)    Other reaction(s): Other (See Comments) intolerate intolerate  . Metformin Diarrhea  . Atorvastatin Rash    REVIEW OF SYSTEMS:   Review of Systems  Constitutional: Negative for fever, chills and weight loss.  HENT: Negative for congestion, nosebleeds and tinnitus.   Eyes: Negative for blurred vision, double vision and redness.  Respiratory: Negative for cough, hemoptysis, shortness of breath and wheezing.   Cardiovascular: Negative for  chest pain, orthopnea, leg swelling and PND.  Gastrointestinal: Positive for nausea, vomiting and abdominal pain. Negative for diarrhea and melena.  Genitourinary: Negative for dysuria, urgency and hematuria.  Musculoskeletal: Negative for joint pain and falls.  Neurological: Negative for dizziness, tingling, sensory change, focal weakness, seizures, weakness and headaches.  Endo/Heme/Allergies: Negative for polydipsia. Does not bruise/bleed easily.  Psychiatric/Behavioral: Negative for depression and memory loss. The patient is not nervous/anxious.   All other  systems reviewed and are negative.   MEDICATIONS AT HOME:   Prior to Admission medications   Medication Sig Start Date End Date Taking? Authorizing Provider  Calcium Carbonate-Vitamin D (CALCIUM-D PO) Take 1 tablet by mouth daily.    Yes Historical Provider, MD  enoxaparin (LOVENOX) 100 MG/ML injection Inject 0.9 mLs (90 mg total) into the skin every 12 (twelve) hours. Take as directed by MD (on 01/29, 01/30, and 01/08/2016) then hold prior to procedure.  Restart after procedure when directed by MD 01/03/16  Yes Lequita Asal, MD  glimepiride (AMARYL) 4 MG tablet Take 1 tablet (4 mg) in the morning and 1 tablet (4 mg) in the evening with food. 03/26/15  Yes Historical Provider, MD  Insulin Glargine (LANTUS) 100 UNIT/ML Solostar Pen Inject 6 Units into the skin daily at 10 pm.  04/16/15  Yes Historical Provider, MD  levothyroxine (SYNTHROID, LEVOTHROID) 150 MCG tablet Take 150 mcg by mouth daily before breakfast.  05/30/14  Yes Historical Provider, MD  lovastatin (MEVACOR) 20 MG tablet Take 20 mg by mouth at bedtime.  11/15/14  Yes Historical Provider, MD  NASAL DECONGESTANT SPRAY 0.05 % nasal spray Place 1 spray into both nostrils 2 (two) times daily as needed for congestion.  11/21/15  Yes Historical Provider, MD  OMEPRAZOLE PO Take 20 mg by mouth daily.   Yes Historical Provider, MD  oxyCODONE (OXY IR/ROXICODONE) 5 MG immediate release tablet Take 1 tablet (5 mg total) by mouth every 4 (four) hours as needed for severe pain. 01/16/16  Yes Evlyn Kanner, NP  promethazine (PHENERGAN) 25 MG tablet Take 25 mg by mouth every 6 (six) hours as needed.  01/15/16  Yes Historical Provider, MD  sitaGLIPtin (JANUVIA) 100 MG tablet Take 100 mg by mouth daily.  01/01/15  Yes Historical Provider, MD  warfarin (COUMADIN) 5 MG tablet Take 5 mg by mouth daily. Take 2 tablets daily ~ 5 days a week Take 2.5 tablets on Weds and Saturdays   Yes Historical Provider, MD      VITAL SIGNS:  Blood pressure 124/89, pulse  74, temperature 98 F (36.7 C), temperature source Oral, resp. rate 21, height 5\' 8"  (1.727 m), weight 90.719 kg (200 lb), SpO2 97 %.  PHYSICAL EXAMINATION:  Physical Exam  GENERAL:  82 y.o.-year-old patient lying in the bed in no acute distress.  EYES: Pupils equal, round, reactive to light and accommodation. No scleral icterus. Extraocular muscles intact.  HEENT: Head atraumatic, normocephalic. Oropharynx and nasopharynx clear. No oropharyngeal erythema, moist oral mucosa  NECK:  Supple, no jugular venous distention. No thyroid enlargement, no tenderness.  LUNGS: Normal breath sounds bilaterally, no wheezing, rales, rhonchi. No use of accessory muscles of respiration.  CARDIOVASCULAR: S1, S2 RRR. No murmurs, rubs, gallops, clicks.  ABDOMEN: Soft, tender diffusely but no rebound or rigidity, distended. Bowel sounds present. No organomegaly or mass.  EXTREMITIES: No pedal edema, cyanosis, or clubbing. + 2 pedal & radial pulses b/l.   NEUROLOGIC: Cranial nerves II through XII are intact. No focal Motor or  sensory deficits appreciated b/l PSYCHIATRIC: The patient is alert and oriented x 3. Good affect.  SKIN: No obvious rash, lesion, or ulcer.   LABORATORY PANEL:   CBC  Recent Labs Lab 01/17/16 1010  WBC 13.0*  HGB 12.7*  HCT 38.0*  PLT 78*   ------------------------------------------------------------------------------------------------------------------  Chemistries   Recent Labs Lab 01/17/16 1010  NA 140  K 4.0  CL 105  CO2 27  GLUCOSE 187*  BUN 18  CREATININE 1.03  CALCIUM 8.9  AST 115*  ALT 239*  ALKPHOS 105  BILITOT 1.8*   ------------------------------------------------------------------------------------------------------------------  Cardiac Enzymes No results for input(s): TROPONINI in the last 168 hours. ------------------------------------------------------------------------------------------------------------------  RADIOLOGY:  Ct Abdomen Pelvis W  Contrast  01/17/2016  CLINICAL DATA:  Abdominal pain with nausea and vomiting since Tuesday. Recently diagnosed with pancreatic cancer. Diabetes. Abdominal swelling. Recent endoscopy. EXAM: CT ABDOMEN AND PELVIS WITH CONTRAST TECHNIQUE: Multidetector CT imaging of the abdomen and pelvis was performed using the standard protocol following bolus administration of intravenous contrast. CONTRAST:  139mL OMNIPAQUE IOHEXOL 300 MG/ML  SOLN COMPARISON:  12/09/2015 and report of the endoscopic ultrasound of 01/10/2016. FINDINGS: Lower chest: Clear lung bases. Mild cardiomegaly, without pericardial or pleural effusion. A small hiatal hernia. Hepatobiliary: Normal liver. Cholecystectomy. Intrahepatic ducts upper normal, without common duct dilatation. Pancreas: Redemonstration of findings consistent with main duct intraductal papillary mucinous tumor. Marked pancreatic atrophy with main duct dilatation. Cystic component or area of focal main duct dilatation measures 2.0 cm on image 29/series 2, similar. Cystic lesion either adjacent to or exophytic off the pancreatic head measures 2.9 by 3.0 cm versus 2.7 x 3.3 cm on the prior. Similar. development of moderate peripancreatic edema, without peripancreatic fluid collection or new cystic lesion. Spleen: Splenomegaly, 16.4 cm craniocaudal. Adrenals/Urinary Tract: Upper pole right renal cyst of 4.5 cm. Normal left kidney, without hydronephrosis. Normal urinary bladder. Stomach/Bowel: Edema within and adjacent the gastric antrum and proximal duodenum, likely secondary to the pancreatic process. Example image 28/series 2. Colonic stool burden suggests constipation. Normal terminal ileum. Otherwise normal small bowel Vascular/Lymphatic: Status post aortic endograft repair, without acute complication. IVC filter is unchanged in position. No abdominopelvic adenopathy. Reproductive: Normal prostate. Other: No significant free fluid. Musculoskeletal: Left hip osteoarthritis.  Lumbosacral  spondylosis. IMPRESSION: 1. Development of pancreatitis. This is superimposed upon similar findings which are consistent with main duct intraductal papillary mucinous tumor. Please see endoscopic ultrasound report for further description. 2. gastric antral and proximal duodenal inflammation, likely secondary. Electronically Signed   By: Abigail Miyamoto M.D.   On: 01/17/2016 13:26     IMPRESSION AND PLAN:   82 year old male with past medical history of diabetes, hypertension, history of bladder cancer, thrombocytopenia, history of previous DVT status post IVC filter, pancreatic mass thought to be malignant who presented to the hospital due to abdominal pain nausea vomiting and noted to have acute pancreatitis.  #1 acute pancreatitis-this is the cause of patient's worsening abdominal pain nausea and vomiting. -Suspect post biopsy about a week ago. Patient has no evidence of alcohol abuse or any biliary pathology to explain his acute pancreatitis. -Continue supportive care with IV fluids, pain control, antiemetics. I will keep patient nothing by mouth for now except for meds and ice chips. -Follow clinically.  #2 pancreatic mass status post biopsy-this is thought to be malignant in nature. Patient is supposed to get a PET scan coming up next week. -I will consult oncology and see if the PET scan can be done by the patient is  hospitalized.  #3 type 2 diabetes without complication-hold Lantus and glimepiride As patient is going to be nothing by mouth. -Continue sliding scale insulin for now.  #4 history of previous DVT-continue Lovenox.  #5 GERD-continue Protonix.  #6 hypothyroidism-continue Synthroid.    All the records are reviewed and case discussed with ED provider. Management plans discussed with the patient, family and they are in agreement.  CODE STATUS: Full  TOTAL TIME TAKING CARE OF THIS PATIENT: 45 minutes.    Henreitta Leber M.D on 01/17/2016 at 3:44 PM  Between 7am to 6pm -  Pager - [phone]  After 6pm go to [email or web address] - password [email or web address] [facility]  Office  [phone]  CC: Primary care physician; Ezequiel Kayser, MD

## 2016-01-17 NOTE — ED Notes (Signed)
Patient transported to CT 

## 2016-01-17 NOTE — ED Notes (Signed)
Pt with abdominal pain and vomiting since Wednesday, diagnosed last week with pancreatic cancer; seen yesterday at urgent care [place] and told to come here today if things got worse.

## 2016-01-17 NOTE — ED Provider Notes (Addendum)
[facility] Emergency Department Provider Note  ____________________________________________  Time seen: Approximately 5:47 PM  I have reviewed the triage vital signs and the nursing notes.   HISTORY  Chief Complaint Abdominal Pain    HPI Kenneth Curry is a 82 y.o. male fence for evaluation of nausea vomiting and upper abdominal pain worse over the last few days. Recent biopsy of his pancreas.  States moderate to severe pain in the upper abdomen. He does feel "bloated". Denies fevers or chills. Is able to pass gas but less bowel movements recently. Was seen in urgent care and morphine helped some but continues to have pain and vomiting.   Past Medical History  Diagnosis Date  . Abdominal aortic aneurysm ([place])   . Pulmonary nodules   . Thrombocytopenia ([place])   . Presence of IVC filter   . Elevated cholesterol   . Diabetes mellitus, type 2 ([place])   . Cellulitis   . History of primary bladder cancer   . Hematuria   . Urinary frequency   . Phimosis   . Pancreatic adenocarcinoma ([place]) 01/16/2016    Patient Active Problem List   Diagnosis Date Noted  . Acute pancreatitis 01/17/2016  . Pancreatic adenocarcinoma ([place]) 01/16/2016  . Bradycardia 01/02/2016  . Type 2 diabetes mellitus ([place]) 10/23/2015  . Diabetes mellitus, type 2 ([place]) 06/12/2015  . Acid reflux 06/12/2015  . HLD (hyperlipidemia) 06/12/2015  . Adult hypothyroidism 06/12/2015  . Avitaminosis D 06/12/2015  . History of bladder cancer 06/12/2015  . History of neoplasm of bladder 06/12/2015  . Long term current use of anticoagulant 05/18/2015  . Polypharmacy 05/18/2015  . Other long term (current) drug therapy 05/18/2015  . Chronic lymphatic leukemia ([place]) 05/14/2014  . Bilateral hearing loss 05/14/2014  . Chronic lymphocytic leukemia ([place]) 05/14/2014  . Bladder tumor 05/08/2014  . Deep vein thrombosis ([place]) 10/21/2013  . H/O abdominal aortic aneurysm repair 10/21/2013  . Change in  blood platelet count 10/21/2013  . Deep vein thrombosis (DVT) ([place]) 10/21/2013  . History of abdominal aortic aneurysm (AAA) repair 10/21/2013  . Thrombocytopenia ([place]) 10/21/2013    Past Surgical History  Procedure Laterality Date  . Cholecystectomy    . Cochlear implant    . Appendectomy    . Abdominal aortic aneurysm repair    . Upper esophageal endoscopic ultrasound (eus) N/A 01/10/2016    Procedure: UPPER ESOPHAGEAL ENDOSCOPIC ULTRASOUND (EUS);  Surgeon: Cora Daniels, MD;  Location: [facility] ENDOSCOPY;  Service: Endoscopy;  Laterality: N/A;    Current Outpatient Rx  Name  Route  Sig  Dispense  Refill  . Calcium Carbonate-Vitamin D (CALCIUM-D PO)   Oral   Take 1 tablet by mouth daily.          Marland Kitchen levothyroxine (SYNTHROID, LEVOTHROID) 150 MCG tablet   Oral   Take 150 mcg by mouth daily before breakfast.          . lovastatin (MEVACOR) 20 MG tablet   Oral   Take 20 mg by mouth at bedtime.          Marland Kitchen NASAL DECONGESTANT SPRAY 0.05 % nasal spray   Each Nare   Place 1 spray into both nostrils 2 (two) times daily as needed for congestion.            Dispense as written.   Marland Kitchen oxyCODONE (OXY IR/ROXICODONE) 5 MG immediate release tablet   Oral   Take 1 tablet (5 mg total) by mouth every 4 (four) hours as needed  for severe pain. Patient not taking: Reported on 01/28/2016   30 tablet   0   . promethazine (PHENERGAN) 25 MG tablet   Oral   Take 25 mg by mouth every 6 (six) hours as needed. Reported on 01/28/2016         . Acetaminophen 500 MG coapsule               . glimepiride (AMARYL) 4 MG tablet               . LANTUS SOLOSTAR 100 UNIT/ML Solostar Pen      INJECT 24 UNITS SUBCUTANEOUSLY ONCE DAILY INTO ABDOMEN,THIGHS,OUTER ARMS EVERY NIGHT. ROTATE SITES      3     Dispense as written.   Marland Kitchen omeprazole (PRILOSEC) 20 MG capsule   Oral   Take 1 capsule (20 mg total) by mouth 2 (two) times daily before a meal.   30 capsule   5   . ondansetron  (ZOFRAN) 4 MG tablet   Oral   Take 1 tablet (4 mg total) by mouth every 8 (eight) hours as needed for nausea or vomiting.   30 tablet   1   . warfarin (COUMADIN) 5 MG tablet      12.5mg  every day Patient taking differently: 10 mg one time only at 6 PM. 12.5mg  every day   30 tablet   0     Allergies Hydrocodone-acetaminophen; Metformin; and Atorvastatin  Family History  Problem Relation Age of Onset  . Lung cancer Brother   . Colon cancer Brother   . Cancer Sister     Renal  . Cancer Mother 40    Kidney  . Cancer Paternal Uncle     melanoma    Social History Social History  Substance Use Topics  . Smoking status: Former Smoker -- 1.00 packs/day    Types: Cigarettes    Quit date: 12/28/2015  . Smokeless tobacco: None  . Alcohol Use: No    Review of Systems Constitutional: No fever/chills Eyes: No visual changes. ENT: No sore throat. Cardiovascular: Denies chest pain. Respiratory: Denies shortness of breath. Gastrointestinal: See history of present illness Genitourinary: Negative for dysuria. Musculoskeletal: Negative for back pain. Skin: Negative for rash. Neurological: Negative for headaches, focal weakness or numbness.  10-point ROS otherwise negative.  ____________________________________________   PHYSICAL EXAM:  VITAL SIGNS: ED Triage Vitals  Enc Vitals Group     BP 01/17/16 0959 100/57 mmHg     Pulse Rate 01/17/16 0959 73     Resp 01/17/16 0959 16     Temp 01/17/16 0957 98 F (36.7 C)     Temp Source 01/17/16 0957 Oral     SpO2 01/17/16 0959 94 %     Weight 01/17/16 0957 200 lb (90.719 kg)     Height 01/17/16 0957 5\' 8"  (1.727 m)     Head Cir --      Peak Flow --      Pain Score 01/17/16 0958 8     Pain Loc --      Pain Edu? --      Excl. in [place]? --    Constitutional: Alert and oriented. Well appearing and in no acute distress. Eyes: Conjunctivae are normal. PERRL. EOMI. Head: Atraumatic. Nose: No  congestion/rhinnorhea. Mouth/Throat: Mucous membranes are moist.  Oropharynx non-erythematous. Neck: No stridor.   Cardiovascular: Normal rate, regular rhythm. Grossly normal heart sounds.  Good peripheral circulation. Respiratory: Normal respiratory effort.  No retractions. Lungs CTAB. Gastrointestinal:  Soft but moderate tenderness without rebound or guarding in the epigastrium.  Musculoskeletal: No lower extremity tenderness nor edema.  No joint effusions. Neurologic:  Normal speech and language. No gross focal neurologic deficits are appreciated. Skin:  Skin is warm, dry and intact. No rash noted. Psychiatric: Mood and affect are normal. Speech and behavior are normal.  ____________________________________________   LABS (all labs ordered are listed, but only abnormal results are displayed)  Labs Reviewed  COMPREHENSIVE METABOLIC PANEL - Abnormal; Notable for the following:    Glucose, Bld 187 (*)    AST 115 (*)    ALT 239 (*)    Total Bilirubin 1.8 (*)    All other components within normal limits  CBC - Abnormal; Notable for the following:    WBC 13.0 (*)    Hemoglobin 12.7 (*)    HCT 38.0 (*)    RDW 16.0 (*)    Platelets 78 (*)    All other components within normal limits  URINALYSIS COMPLETEWITH MICROSCOPIC ([facility] ONLY) - Abnormal; Notable for the following:    Color, Urine YELLOW (*)    APPearance CLEAR (*)    Hgb urine dipstick 1+ (*)    Squamous Epithelial / LPF 0-5 (*)    All other components within normal limits  PROTIME-INR - Abnormal; Notable for the following:    Prothrombin Time 15.5 (*)    All other components within normal limits  CBC - Abnormal; Notable for the following:    RBC 3.99 (*)    Hemoglobin 11.3 (*)    HCT 34.0 (*)    RDW 15.6 (*)    Platelets 65 (*)    All other components within normal limits  COMPREHENSIVE METABOLIC PANEL - Abnormal; Notable for the following:    Calcium 8.4 (*)    Total Protein 5.6 (*)    Albumin 2.9 (*)    AST 48 (*)     ALT 142 (*)    Total Bilirubin 1.7 (*)    Anion gap 4 (*)    All other components within normal limits  PROTIME-INR - Abnormal; Notable for the following:    Prothrombin Time 15.9 (*)    All other components within normal limits  GLUCOSE, CAPILLARY - Abnormal; Notable for the following:    Glucose-Capillary 120 (*)    All other components within normal limits  GLUCOSE, CAPILLARY - Abnormal; Notable for the following:    Glucose-Capillary 59 (*)    All other components within normal limits  GLUCOSE, CAPILLARY - Abnormal; Notable for the following:    Glucose-Capillary 101 (*)    All other components within normal limits  GLUCOSE, CAPILLARY - Abnormal; Notable for the following:    Glucose-Capillary 59 (*)    All other components within normal limits  GLUCOSE, CAPILLARY - Abnormal; Notable for the following:    Glucose-Capillary 63 (*)    All other components within normal limits  GLUCOSE, CAPILLARY - Abnormal; Notable for the following:    Glucose-Capillary 105 (*)    All other components within normal limits  GLUCOSE, CAPILLARY - Abnormal; Notable for the following:    Glucose-Capillary 127 (*)    All other components within normal limits  CBC - Abnormal; Notable for the following:    RBC 4.02 (*)    Hemoglobin 11.2 (*)    HCT 33.6 (*)    RDW 15.5 (*)    Platelets 66 (*)    All other components within normal limits  GLUCOSE, CAPILLARY - Abnormal;  Notable for the following:    Glucose-Capillary 102 (*)    All other components within normal limits  GLUCOSE, CAPILLARY - Abnormal; Notable for the following:    Glucose-Capillary 120 (*)    All other components within normal limits  GLUCOSE, CAPILLARY - Abnormal; Notable for the following:    Glucose-Capillary 226 (*)    All other components within normal limits  LIPASE, BLOOD  GLUCOSE, CAPILLARY  GLUCOSE, CAPILLARY  GLUCOSE, CAPILLARY  GLUCOSE, CAPILLARY  CREATININE, SERUM  GLUCOSE, CAPILLARY  GLUCOSE, CAPILLARY    ____________________________________________  EKG  Reviewed and interpreted by me EKG time 10 AM Ventricular rate 70 QRS 100 QTc 410 Normal sinus rhythm with premature atrial complexes No evidence of acute ischemic change ____________________________________________  RADIOLOGY  Results       CT Abdomen Pelvis W Contrast (Final result) Result time: 01/17/16 13:26:26   Final result by Rad Results In Interface (01/17/16 13:26:26)   Narrative:   CLINICAL DATA: Abdominal pain with nausea and vomiting since Tuesday. Recently diagnosed with pancreatic cancer. Diabetes. Abdominal swelling. Recent endoscopy.  EXAM: CT ABDOMEN AND PELVIS WITH CONTRAST  TECHNIQUE: Multidetector CT imaging of the abdomen and pelvis was performed using the standard protocol following bolus administration of intravenous contrast.  CONTRAST: 143mL OMNIPAQUE IOHEXOL 300 MG/ML SOLN  COMPARISON: 12/09/2015 and report of the endoscopic ultrasound of 01/10/2016.  FINDINGS: Lower chest: Clear lung bases. Mild cardiomegaly, without pericardial or pleural effusion. A small hiatal hernia.  Hepatobiliary: Normal liver. Cholecystectomy. Intrahepatic ducts upper normal, without common duct dilatation.  Pancreas: Redemonstration of findings consistent with main duct intraductal papillary mucinous tumor. Marked pancreatic atrophy with main duct dilatation. Cystic component or area of focal main duct dilatation measures 2.0 cm on image 29/series 2, similar.  Cystic lesion either adjacent to or exophytic off the pancreatic head measures 2.9 by 3.0 cm versus 2.7 x 3.3 cm on the prior. Similar.  development of moderate peripancreatic edema, without peripancreatic fluid collection or new cystic lesion.  Spleen: Splenomegaly, 16.4 cm craniocaudal.  Adrenals/Urinary Tract: Upper pole right renal cyst of 4.5 cm. Normal left kidney, without hydronephrosis. Normal urinary bladder.  Stomach/Bowel:  Edema within and adjacent the gastric antrum and proximal duodenum, likely secondary to the pancreatic process. Example image 28/series 2. Colonic stool burden suggests constipation. Normal terminal ileum. Otherwise normal small bowel  Vascular/Lymphatic: Status post aortic endograft repair, without acute complication. IVC filter is unchanged in position. No abdominopelvic adenopathy.  Reproductive: Normal prostate.  Other: No significant free fluid.  Musculoskeletal: Left hip osteoarthritis. Lumbosacral spondylosis.  IMPRESSION: 1. Development of pancreatitis. This is superimposed upon similar findings which are consistent with main duct intraductal papillary mucinous tumor. Please see endoscopic ultrasound report for further description. 2. gastric antral and proximal duodenal inflammation, likely secondary.   Electronically Signed By: Abigail Miyamoto M.D. On: 01/17/2016 13:26    ____________________________________________   PROCEDURES  Procedure(s) performed: None  Critical Care performed: No  ____________________________________________   INITIAL IMPRESSION / ASSESSMENT AND PLAN / ED COURSE  Pertinent labs & imaging results that were available during my care of the patient were reviewed by me and considered in my medical decision making (see chart for details).  No acute cardiopulmonary symptoms. Patient has notable upper abdominal pain after recent endoscopic biopsy of the pancreas. Suspect possible pancreatitis, though consideration for bowel obstruction, aneurysm, dissection perforation, and other acute intra-abdominal processes are felt to be less likely but considered.  Discussed with Dr. Allen Norris. He advises admission for treatment of  pancreatitis. Patient hemodynamically stable, awake and alert with pain improved but will admit for nothing by mouth status and further evaluation with gastroenterology and hospitalist  service.   ____________________________________________   FINAL CLINICAL IMPRESSION(S) / ED DIAGNOSES  Final diagnoses:  Other acute pancreatitis, unspecified complication status   acute pancreatitis    Delman Kitten, MD 01/17/16 1750  Delman Kitten, MD 02/08/16 [phone]

## 2016-01-18 ENCOUNTER — Other Ambulatory Visit: Payer: Commercial Managed Care - HMO

## 2016-01-18 LAB — GLUCOSE, CAPILLARY
GLUCOSE-CAPILLARY: 101 mg/dL — AB (ref 65–99)
GLUCOSE-CAPILLARY: 59 mg/dL — AB (ref 65–99)
GLUCOSE-CAPILLARY: 59 mg/dL — AB (ref 65–99)
GLUCOSE-CAPILLARY: 63 mg/dL — AB (ref 65–99)
GLUCOSE-CAPILLARY: 76 mg/dL (ref 65–99)
GLUCOSE-CAPILLARY: 85 mg/dL (ref 65–99)
Glucose-Capillary: 74 mg/dL (ref 65–99)
Glucose-Capillary: 81 mg/dL (ref 65–99)
Glucose-Capillary: 105 mg/dL — ABNORMAL HIGH (ref 65–99)
Glucose-Capillary: 127 mg/dL — ABNORMAL HIGH (ref 65–99)

## 2016-01-18 LAB — COMPREHENSIVE METABOLIC PANEL
ALBUMIN: 2.9 g/dL — AB (ref 3.5–5.0)
ALT: 142 U/L — AB (ref 17–63)
ANION GAP: 4 — AB (ref 5–15)
AST: 48 U/L — ABNORMAL HIGH (ref 15–41)
Alkaline Phosphatase: 91 U/L (ref 38–126)
BUN: 14 mg/dL (ref 6–20)
CHLORIDE: 110 mmol/L (ref 101–111)
CO2: 28 mmol/L (ref 22–32)
Calcium: 8.4 mg/dL — ABNORMAL LOW (ref 8.9–10.3)
Creatinine, Ser: 0.9 mg/dL (ref 0.61–1.24)
GFR calc non Af Amer: >60 mL/min (ref >60)
Glucose, Bld: 73 mg/dL (ref 65–99)
Potassium: 3.7 mmol/L (ref 3.5–5.1)
SODIUM: 142 mmol/L (ref 135–145)
Total Bilirubin: 1.7 mg/dL — ABNORMAL HIGH (ref 0.3–1.2)
Total Protein: 5.6 g/dL — ABNORMAL LOW (ref 6.5–8.1)

## 2016-01-18 LAB — CBC
HCT: 34 % — ABNORMAL LOW (ref 40.0–52.0)
Hemoglobin: 11.3 g/dL — ABNORMAL LOW (ref 13.0–18.0)
MCH: 28.3 pg (ref 26.0–34.0)
MCHC: 33.2 g/dL (ref 32.0–36.0)
MCV: 85.3 fL (ref 80.0–100.0)
PLATELETS: 65 10*3/uL — AB (ref 150–440)
RBC: 3.99 MIL/uL — AB (ref 4.40–5.90)
RDW: 15.6 % — ABNORMAL HIGH (ref 11.5–14.5)
WBC: 9.4 10*3/uL (ref 3.8–10.6)

## 2016-01-18 LAB — PROTIME-INR
INR: 1.26
Prothrombin Time: 15.9 seconds — ABNORMAL HIGH (ref 11.4–15.0)

## 2016-01-18 MED ORDER — DEXTROSE 50 % IV SOLN
INTRAVENOUS | Status: AC
  Filled 2016-01-18: qty 50

## 2016-01-18 MED ORDER — DEXTROSE 50 % IV SOLN
25.0000 mL | Freq: Once | INTRAVENOUS | Status: AC
  Administered 2016-01-18: 12:00:00 25 mL via INTRAVENOUS

## 2016-01-18 MED ORDER — DEXTROSE 50 % IV SOLN
25.0000 mL | Freq: Once | INTRAVENOUS | Status: AC
  Administered 2016-01-18: 06:00:00 25 mL via INTRAVENOUS
  Filled 2016-01-18: qty 50

## 2016-01-18 MED ORDER — DEXTROSE 50 % IV SOLN
25.0000 mL | Freq: Once | INTRAVENOUS | Status: AC
  Administered 2016-01-18: 25 mL via INTRAVENOUS
  Filled 2016-01-18: qty 50

## 2016-01-18 NOTE — Plan of Care (Signed)
Patient was seen, interviewed, examined. Formal consult to follow. Plan, as discussed with Dr. Verdell Carmine: We will hold PET CT scan until resolution of pancreatitis to avoid false PET positivity due to inflammation and edema. PET CT scan will be performed on an outpatient basis next week. Kenneth Curry. 01/18/2016 5:30 PM

## 2016-01-18 NOTE — Progress Notes (Signed)
Inpatient Diabetes Program Recommendations  AACE/ADA: New Consensus Statement on Inpatient Glycemic Control (2015)  Target Ranges:  Prepandial:   less than 140 mg/dL      Peak postprandial:   less than 180 mg/dL (1-2 hours)      Critically ill patients:  140 - 180 mg/dL   Review of Glycemic Control  Results for ASHBY, KAPALA (MRN [id number]) as of 01/18/2016 12:19  Ref. Range 01/18/2016 04:51 01/18/2016 06:14 01/18/2016 06:55 01/18/2016 07:52 01/18/2016 11:28  Glucose-Capillary Latest Ref Range: 65-99 mg/dL 74 59 (L) 85 81 63 (L)    Diabetes history: Type 2 Outpatient Diabetes medications: Amaryl 4mg  bid, Lantus 6 units qhs, Januvia 100mg /day  Current orders for Inpatient glycemic control:   Inpatient Diabetes Program Recommendations:  Consider starting D5W @ 50-100cc/hour.  Consider changing blood sugar checks to q4h.   Text page to Dr. Sharion Balloon, RN, BA, [place], CDE Diabetes Coordinator Inpatient Diabetes Program  [phone] (Team Pager) [phone] ([place]) 01/18/2016 12:24 PM

## 2016-01-18 NOTE — Plan of Care (Signed)
Problem: Education: Goal: Knowledge of [facility] Education information/materials will improve Outcome: Progressing Blood sugars  Was low  Earlier  And at lunch  With dextrose given.  Notified md and he started clear liquids. bs started to increase.   Problem: Nutrition: Goal: Adequate nutrition will be maintained Outcome: Progressing Started clear liquins today tol well

## 2016-01-18 NOTE — Progress Notes (Signed)
[name] [facility] [place] NAME: Kenneth Curry    MR#:  [id number]  DATE OF BIRTH:  Jul 26, 1933  SUBJECTIVE:   Pt. Here due to acute pancreatitis.  Still having some abdominal pain but improved.  Was a bit hypoglycemic but improved w/ some Dextrose.  Family at bedside.    REVIEW OF SYSTEMS:    Review of Systems  Constitutional: Negative for fever and chills.  HENT: Negative for congestion and tinnitus.   Eyes: Negative for blurred vision and double vision.  Respiratory: Negative for cough, shortness of breath and wheezing.   Cardiovascular: Negative for chest pain, orthopnea and PND.  Gastrointestinal: Positive for abdominal pain. Negative for nausea, vomiting and diarrhea.  Genitourinary: Negative for dysuria and hematuria.  Neurological: Negative for dizziness, sensory change and focal weakness.  All other systems reviewed and are negative.   Nutrition: Clear liquid Tolerating Diet: yes Tolerating PT: Await Eval.    DRUG ALLERGIES:   Allergies  Allergen Reactions  . Hydrocodone-Acetaminophen Other (See Comments)    Other reaction(s): Other (See Comments) intolerate intolerate  . Metformin Diarrhea  . Atorvastatin Rash    VITALS:  Blood pressure 100/55, pulse 53, temperature 98.2 F (36.8 C), temperature source Oral, resp. rate 20, height 5\' 8"  (1.727 m), weight 90.719 kg (200 lb), SpO2 92 %.  PHYSICAL EXAMINATION:   Physical Exam  GENERAL:  82 y.o.-year-old obese patient lying in the bed in no acute distress.  EYES: Pupils equal, round, reactive to light and accommodation. No scleral icterus. Extraocular muscles intact.  HEENT: Head atraumatic, normocephalic. Oropharynx and nasopharynx clear.  NECK:  Supple, no jugular venous distention. No thyroid enlargement, no tenderness.  LUNGS: Normal breath sounds bilaterally, no wheezing, rales, rhonchi. No use of accessory muscles of respiration.  CARDIOVASCULAR: S1, S2  normal. No murmurs, rubs, or gallops.  ABDOMEN: Soft, tender in the epigastric area, no rebound, rigidity, slightly distended. Bowel sounds present. No organomegaly or mass.  EXTREMITIES: No cyanosis, clubbing or edema b/l.    NEUROLOGIC: Cranial nerves II through XII are intact. No focal Motor or sensory deficits b/l.   PSYCHIATRIC: The patient is alert and oriented x 3. Good affect.  SKIN: No obvious rash, lesion, or ulcer.    LABORATORY PANEL:   CBC  Recent Labs Lab 01/18/16 0427  WBC 9.4  HGB 11.3*  HCT 34.0*  PLT 65*   ------------------------------------------------------------------------------------------------------------------  Chemistries   Recent Labs Lab 01/18/16 0427  NA 142  K 3.7  CL 110  CO2 28  GLUCOSE 73  BUN 14  CREATININE 0.90  CALCIUM 8.4*  AST 48*  ALT 142*  ALKPHOS 91  BILITOT 1.7*   ------------------------------------------------------------------------------------------------------------------  Cardiac Enzymes No results for input(s): TROPONINI in the last 168 hours. ------------------------------------------------------------------------------------------------------------------  RADIOLOGY:  Ct Abdomen Pelvis W Contrast  01/17/2016  CLINICAL DATA:  Abdominal pain with nausea and vomiting since Tuesday. Recently diagnosed with pancreatic cancer. Diabetes. Abdominal swelling. Recent endoscopy. EXAM: CT ABDOMEN AND PELVIS WITH CONTRAST TECHNIQUE: Multidetector CT imaging of the abdomen and pelvis was performed using the standard protocol following bolus administration of intravenous contrast. CONTRAST:  162mL OMNIPAQUE IOHEXOL 300 MG/ML  SOLN COMPARISON:  12/09/2015 and report of the endoscopic ultrasound of 01/10/2016. FINDINGS: Lower chest: Clear lung bases. Mild cardiomegaly, without pericardial or pleural effusion. A small hiatal hernia. Hepatobiliary: Normal liver. Cholecystectomy. Intrahepatic ducts upper normal, without common duct  dilatation. Pancreas: Redemonstration of findings consistent with main duct intraductal papillary mucinous  tumor. Marked pancreatic atrophy with main duct dilatation. Cystic component or area of focal main duct dilatation measures 2.0 cm on image 29/series 2, similar. Cystic lesion either adjacent to or exophytic off the pancreatic head measures 2.9 by 3.0 cm versus 2.7 x 3.3 cm on the prior. Similar. development of moderate peripancreatic edema, without peripancreatic fluid collection or new cystic lesion. Spleen: Splenomegaly, 16.4 cm craniocaudal. Adrenals/Urinary Tract: Upper pole right renal cyst of 4.5 cm. Normal left kidney, without hydronephrosis. Normal urinary bladder. Stomach/Bowel: Edema within and adjacent the gastric antrum and proximal duodenum, likely secondary to the pancreatic process. Example image 28/series 2. Colonic stool burden suggests constipation. Normal terminal ileum. Otherwise normal small bowel Vascular/Lymphatic: Status post aortic endograft repair, without acute complication. IVC filter is unchanged in position. No abdominopelvic adenopathy. Reproductive: Normal prostate. Other: No significant free fluid. Musculoskeletal: Left hip osteoarthritis.  Lumbosacral spondylosis. IMPRESSION: 1. Development of pancreatitis. This is superimposed upon similar findings which are consistent with main duct intraductal papillary mucinous tumor. Please see endoscopic ultrasound report for further description. 2. gastric antral and proximal duodenal inflammation, likely secondary. Electronically Signed   By: Abigail Miyamoto M.D.   On: 01/17/2016 13:26     ASSESSMENT AND PLAN:   82 year old male with past medical history of diabetes, hypertension, history of bladder cancer, thrombocytopenia, history of previous DVT status post IVC filter, pancreatic mass thought to be malignant who presented to the hospital due to abdominal pain nausea vomiting and noted to have acute pancreatitis.  #1 acute  pancreatitis-this is the cause of patient's worsening abdominal pain nausea and vomiting. - likely post biopsy about a week ago. Patient has no evidence of alcohol abuse or any biliary pathology to explain his acute pancreatitis. -Continue supportive care with IV fluids, pain control, antiemetics and improved since yesterday. will start on clear liquid diet and monitor.  -Follow clinically.  #2 pancreatic mass status post biopsy-this is thought to be malignant in nature. S/p biopsy consistent with Malignancy.  - Will likely need PET scan but will wait to do it as pt. Has pancreatitis and acute inflammation which could give [place] a false + result.  - await further Oncology input.   #3 type 2 diabetes without complication-Hypoglycemic this a.m. But improved w/ Dextros.  - will start on clear liquid diet.  -Continue sliding scale insulin for now.  Hold Lantus and glimepiride for now  #4 history of previous DVT-continue Lovenox.  #5 GERD-continue Protonix.  #6 hypothyroidism-continue Synthroid.   All the records are reviewed and case discussed with Care Management/Social Workerr. Management plans discussed with the patient, family and they are in agreement.  CODE STATUS: Full  DVT Prophylaxis: Lovenox  TOTAL TIME TAKING CARE OF THIS PATIENT: 25 minutes.   POSSIBLE D/C IN 1-2 DAYS, DEPENDING ON CLINICAL CONDITION.   Henreitta Leber M.D on 01/18/2016 at 3:30 PM  Between 7am to 6pm - Pager - [phone]  After 6pm go to [email or web address] - password [email or web address] [facility]  Office  [phone]  CC: Primary care physician; Ezequiel Kayser, MD

## 2016-01-18 NOTE — H&P (Signed)
[facility] NAME: Kenneth Curry    MR#:  [id number]  DATE OF BIRTH:  06-22-1933  DATE OF ADMISSION:  01/17/2016  PRIMARY CARE PHYSICIAN: Ezequiel Kayser, MD   REQUESTING/REFERRING PHYSICIAN: Dr. Delman Kitten  CHIEF COMPLAINT:   Chief Complaint  Patient presents with  . Abdominal Pain  N/V  HISTORY OF PRESENT ILLNESS:  Kenneth Curry  is a 82 y.o. male with a known history of abdominal aortic aneurysm, history of previous DVT status post IVC filter, diabetes, history of bladder cancer, pancreatic mass suspicious to be malignant, thrombocytopenia who presents to the hospital due to abdominal pain nausea and vomiting ongoing for the past 2 days. Patient underwent a endoscopic ultrasound and biopsy for the pancreatic mass about a week ago. He was doing well until the past 2 days he started developing worsening abdominal pain which was diffuse in nature and radiating to his back, it was also associated with nausea and vomiting. He has had poor by mouth intake over the past 2 days. He went to urgent care in [facility] yesterday and are treated him supportively with IV fluids and pain medications and he improved and he was discharged home. His morphine wore off and his pain recurred and therefore he presented to the ER for further evaluation today. Patient underwent a CT scan of the abdomen and pelvis which showed evidence of acute pancreatitis and hospitalist services were contacted further treatment and evaluation  PAST MEDICAL HISTORY:   Past Medical History  Diagnosis Date  . Abdominal aortic aneurysm ([place])   . Pulmonary nodules   . Thrombocytopenia ([place])   . Presence of IVC filter   . Elevated cholesterol   . Diabetes mellitus, type 2 ([place])   . Cellulitis   . History of primary bladder cancer   . Hematuria   . Urinary frequency   . Phimosis   . Pancreatic adenocarcinoma ([place]) 01/16/2016    PAST SURGICAL HISTORY:   Past Surgical  History  Procedure Laterality Date  . Cholecystectomy    . Cochlear implant    . Appendectomy    . Abdominal aortic aneurysm repair    . Upper esophageal endoscopic ultrasound (eus) N/A 01/10/2016    Procedure: UPPER ESOPHAGEAL ENDOSCOPIC ULTRASOUND (EUS);  Surgeon: Cora Daniels, MD;  Location: [facility] ENDOSCOPY;  Service: Endoscopy;  Laterality: N/A;    SOCIAL HISTORY:   Social History  Substance Use Topics  . Smoking status: Former Smoker -- 1.00 packs/day    Types: Cigarettes    Quit date: 12/28/2015  . Smokeless tobacco: Not on file  . Alcohol Use: No    FAMILY HISTORY:   Family History  Problem Relation Age of Onset  . Lung cancer Brother   . Colon cancer Brother   . Cancer Sister     Renal  . Cancer Mother 48    Kidney  . Cancer Paternal Uncle     melanoma    DRUG ALLERGIES:   Allergies  Allergen Reactions  . Hydrocodone-Acetaminophen Other (See Comments)    Other reaction(s): Other (See Comments) intolerate intolerate  . Metformin Diarrhea  . Atorvastatin Rash    REVIEW OF SYSTEMS:   Review of Systems  Constitutional: Negative for fever and weight loss.  HENT: Negative for congestion, nosebleeds and tinnitus.   Eyes: Negative for blurred vision, double vision and redness.  Respiratory: Negative for cough, hemoptysis and shortness of breath.   Cardiovascular: Negative for chest pain, orthopnea,  leg swelling and PND.  Gastrointestinal: Positive for nausea, vomiting and abdominal pain. Negative for diarrhea and melena.  Genitourinary: Negative for dysuria, urgency and hematuria.  Musculoskeletal: Negative for joint pain and falls.  Neurological: Negative for dizziness, tingling, sensory change, focal weakness, seizures, weakness and headaches.  Endo/Heme/Allergies: Negative for polydipsia. Does not bruise/bleed easily.  Psychiatric/Behavioral: Negative for depression and memory loss. The patient is not nervous/anxious.     MEDICATIONS AT HOME:    Prior to Admission medications   Medication Sig Start Date End Date Taking? Authorizing Provider  Calcium Carbonate-Vitamin D (CALCIUM-D PO) Take 1 tablet by mouth daily.    Yes Historical Provider, MD  enoxaparin (LOVENOX) 100 MG/ML injection Inject 0.9 mLs (90 mg total) into the skin every 12 (twelve) hours. Take as directed by MD (on 01/29, 01/30, and 01/08/2016) then hold prior to procedure.  Restart after procedure when directed by MD 01/03/16  Yes Lequita Asal, MD  glimepiride (AMARYL) 4 MG tablet Take 1 tablet (4 mg) in the morning and 1 tablet (4 mg) in the evening with food. 03/26/15  Yes Historical Provider, MD  Insulin Glargine (LANTUS) 100 UNIT/ML Solostar Pen Inject 6 Units into the skin daily at 10 pm.  04/16/15  Yes Historical Provider, MD  levothyroxine (SYNTHROID, LEVOTHROID) 150 MCG tablet Take 150 mcg by mouth daily before breakfast.  05/30/14  Yes Historical Provider, MD  lovastatin (MEVACOR) 20 MG tablet Take 20 mg by mouth at bedtime.  11/15/14  Yes Historical Provider, MD  NASAL DECONGESTANT SPRAY 0.05 % nasal spray Place 1 spray into both nostrils 2 (two) times daily as needed for congestion.  11/21/15  Yes Historical Provider, MD  OMEPRAZOLE PO Take 20 mg by mouth daily.   Yes Historical Provider, MD  oxyCODONE (OXY IR/ROXICODONE) 5 MG immediate release tablet Take 1 tablet (5 mg total) by mouth every 4 (four) hours as needed for severe pain. 01/16/16  Yes Evlyn Kanner, NP  promethazine (PHENERGAN) 25 MG tablet Take 25 mg by mouth every 6 (six) hours as needed.  01/15/16  Yes Historical Provider, MD  sitaGLIPtin (JANUVIA) 100 MG tablet Take 100 mg by mouth daily.  01/01/15  Yes Historical Provider, MD  warfarin (COUMADIN) 5 MG tablet Take 5 mg by mouth daily. Take 2 tablets daily ~ 5 days a week Take 2.5 tablets on Weds and Saturdays   Yes Historical Provider, MD      VITAL SIGNS:  Blood pressure 101/59, pulse 54, temperature 98.2 F (36.8 C), temperature source Oral,  resp. rate 18, height 5\' 8"  (1.727 m), weight 90.719 kg (200 lb), SpO2 95 %.  PHYSICAL EXAMINATION:  Physical Exam  GENERAL: 82 y.o.-year-old patient lying in the bed in no acute distress.  EYES: Pupils equal, round, reactive to light and accommodation. No scleral icterus. Extraocular muscles intact.  HEENT: Head atraumatic, normocephalic. Oropharynx and nasopharynx clear. No oropharyngeal erythema, moist oral mucosa  NECK: Supple, no jugular venous distention. No thyroid enlargement, no tenderness.  LUNGS: Normal breath sounds bilaterally, no wheezing, rales, rhonchi. No use of accessory muscles of respiration.  CARDIOVASCULAR: S1, S2 RRR. No murmurs, rubs, gallops, clicks.  ABDOMEN: Soft, tender diffusely but no rebound or rigidity, distended. Bowel sounds present. No organomegaly or mass.  EXTREMITIES: No pedal edema, cyanosis, or clubbing. + 2 pedal & radial pulses b/l.  NEUROLOGIC: Cranial nerves II through XII are intact. No focal Motor or sensory deficits appreciated b/l PSYCHIATRIC: The patient is alert and oriented x 3.  Good affect.  SKIN: No obvious rash, lesion, or ulcer.   LABORATORY PANEL:   CBC  Recent Labs Lab 01/18/16 0427  WBC 9.4  HGB 11.3*  HCT 34.0*  PLT 65*   ------------------------------------------------------------------------------------------------------------------  Chemistries   Recent Labs Lab 01/18/16 0427  NA 142  K 3.7  CL 110  CO2 28  GLUCOSE 73  BUN 14  CREATININE 0.90  CALCIUM 8.4*  AST 48*  ALT 142*  ALKPHOS 91  BILITOT 1.7*   ------------------------------------------------------------------------------------------------------------------  Cardiac Enzymes No results for input(s): TROPONINI in the last 168 hours. ------------------------------------------------------------------------------------------------------------------  RADIOLOGY:  Ct Abdomen Pelvis W Contrast  01/17/2016  CLINICAL DATA:  Abdominal pain  with nausea and vomiting since Tuesday. Recently diagnosed with pancreatic cancer. Diabetes. Abdominal swelling. Recent endoscopy. EXAM: CT ABDOMEN AND PELVIS WITH CONTRAST TECHNIQUE: Multidetector CT imaging of the abdomen and pelvis was performed using the standard protocol following bolus administration of intravenous contrast. CONTRAST:  124mL OMNIPAQUE IOHEXOL 300 MG/ML  SOLN COMPARISON:  12/09/2015 and report of the endoscopic ultrasound of 01/10/2016. FINDINGS: Lower chest: Clear lung bases. Mild cardiomegaly, without pericardial or pleural effusion. A small hiatal hernia. Hepatobiliary: Normal liver. Cholecystectomy. Intrahepatic ducts upper normal, without common duct dilatation. Pancreas: Redemonstration of findings consistent with main duct intraductal papillary mucinous tumor. Marked pancreatic atrophy with main duct dilatation. Cystic component or area of focal main duct dilatation measures 2.0 cm on image 29/series 2, similar. Cystic lesion either adjacent to or exophytic off the pancreatic head measures 2.9 by 3.0 cm versus 2.7 x 3.3 cm on the prior. Similar. development of moderate peripancreatic edema, without peripancreatic fluid collection or new cystic lesion. Spleen: Splenomegaly, 16.4 cm craniocaudal. Adrenals/Urinary Tract: Upper pole right renal cyst of 4.5 cm. Normal left kidney, without hydronephrosis. Normal urinary bladder. Stomach/Bowel: Edema within and adjacent the gastric antrum and proximal duodenum, likely secondary to the pancreatic process. Example image 28/series 2. Colonic stool burden suggests constipation. Normal terminal ileum. Otherwise normal small bowel Vascular/Lymphatic: Status post aortic endograft repair, without acute complication. IVC filter is unchanged in position. No abdominopelvic adenopathy. Reproductive: Normal prostate. Other: No significant free fluid. Musculoskeletal: Left hip osteoarthritis.  Lumbosacral spondylosis. IMPRESSION: 1. Development of  pancreatitis. This is superimposed upon similar findings which are consistent with main duct intraductal papillary mucinous tumor. Please see endoscopic ultrasound report for further description. 2. gastric antral and proximal duodenal inflammation, likely secondary. Electronically Signed   By: Abigail Miyamoto M.D.   On: 01/17/2016 13:26     IMPRESSION AND PLAN:   82 year old male with past medical history of diabetes, hypertension, history of bladder cancer, thrombocytopenia, history of previous DVT status post IVC filter, pancreatic mass thought to be malignant who presented to the hospital due to abdominal pain nausea vomiting and noted to have acute pancreatitis.  #1 acute pancreatitis-this is the cause of patient's worsening abdominal pain nausea and vomiting. -Suspect post biopsy about a week ago. Patient has no evidence of alcohol abuse or any biliary pathology to explain his acute pancreatitis. -Continue supportive care with IV fluids, pain control, antiemetics. I will keep patient nothing by mouth for now except for meds and ice chips. -Follow clinically.  #2 pancreatic mass status post biopsy-this is thought to be malignant in nature. Patient is supposed to get a PET scan coming up next week. -I will consult oncology and see if the PET scan can be done by the patient is hospitalized.  #3 type 2 diabetes without complication-hold Lantus and glimepiride As patient  is going to be nothing by mouth. -Continue sliding scale insulin for now.  #4 history of previous DVT-continue Lovenox.  #5 GERD-continue Protonix.  #6 hypothyroidism-continue Synthroid.    All the records are reviewed and case discussed with ED provider. Management plans discussed with the patient, family and they are in agreement.  CODE STATUS: Full  TOTAL TIME TAKING CARE OF THIS PATIENT: 45 minutes.    Henreitta Leber M.D on 01/18/2016 at 8:27 AM  Between 7am to 6pm - Pager - [phone]  After 6pm go to  [email or web address] - password [email or web address] [facility]  Office  [phone]  CC: Primary care physician; Ezequiel Kayser, MD

## 2016-01-18 NOTE — Progress Notes (Signed)
Initial Nutrition Assessment   INTERVENTION:   Coordination of Care: will await diet progression as medically able   NUTRITION DIAGNOSIS:   Inadequate oral intake related to inability to eat as evidenced by NPO status.  GOAL:   Patient will meet greater than or equal to 90% of their needs  MONITOR:    (Energy Intake, Gastrointestinal Profile, Glucose Profile)  REASON FOR ASSESSMENT:   Consult Assessment of nutrition requirement/status  ASSESSMENT:   Pt admitted with n/v and abdominal pain. Pt with h/o pancreatic mass s/p biopsy per MD note.  Past Medical History  Diagnosis Date  . Abdominal aortic aneurysm ([place])   . Pulmonary nodules   . Thrombocytopenia ([place])   . Presence of IVC filter   . Elevated cholesterol   . Diabetes mellitus, type 2 ([place])   . Cellulitis   . History of primary bladder cancer   . Hematuria   . Urinary frequency   . Phimosis   . Pancreatic adenocarcinoma ([place]) 01/16/2016     Diet Order:  Diet NPO time specified Except for: Sips with Meds    Current Nutrition: Pt NPO, reports last time eating was yesterday at breakfast.  Food/Nutrition-Related History: Pt reports good appetite PTA eating 3 meals per day plus snacks. Pt reports usually eating egg biscuit in the am, sandwiches during the day at work and then a balanced dinner. Pt reports when his outpatient MDs ordered medications to level blood sugars, his appetite really went up which was a couple of months ago.    Scheduled Medications:  . calcium-vitamin D   Oral Daily  . dextrose      . enoxaparin  90 mg Subcutaneous Q12H  . insulin aspart  0-5 Units Subcutaneous QHS  . insulin aspart  0-9 Units Subcutaneous TID WC  . levothyroxine  150 mcg Oral QAC breakfast    Continuous Medications:  . sodium chloride 125 mL/hr at 01/18/16 1058     Electrolyte/Renal Profile and Glucose Profile:   Recent Labs Lab 01/16/16 1019 01/17/16 1010 01/18/16 0427  NA 139 140 142  K 4.0 4.0 3.7   CL 104 105 110  CO2 29 27 28   BUN 17 18 14   CREATININE 0.93 1.03 0.90  CALCIUM 9.3 8.9 8.4*  GLUCOSE 135* 187* 73   Protein Profile:  Recent Labs Lab 01/16/16 1019 01/17/16 1010 01/18/16 0427  ALBUMIN 3.8 3.6 2.9*   Lipase     Component Value Date/Time   LIPASE 20 01/17/2016 1010    Gastrointestinal Profile: Last BM:  01/15/2016   Nutrition-Focused Physical Exam Findings: Nutrition-Focused physical exam completed. Findings are WDL for fat depletion, muscle depletion, and edema.    Weight Change: Pt reports weight gain since initiation of blood glucose medications.  Per [facility] weight encounters weight gain since July 2016.   Skin:  Reviewed, no issues   Height:   Ht Readings from Last 1 Encounters:  01/17/16 5\' 8"  (1.727 m)    Weight:   Wt Readings from Last 1 Encounters:  01/17/16 200 lb (90.719 kg)   Wt Readings from Last 10 Encounters:  01/17/16 200 lb (90.719 kg)  01/16/16 204 lb 4.1 oz (92.65 kg)  01/02/16 204 lb 12.9 oz (92.9 kg)  12/19/15 204 lb 5.9 oz (92.7 kg)  12/09/15 200 lb (90.719 kg)  10/23/15 199 lb 14.4 oz (90.674 kg)  09/03/15 199 lb (90.266 kg)  06/12/15 195 lb 9.6 oz (88.724 kg)    Ideal Body Weight:   70kg  BMI:  Body mass index is 30.42 kg/(m^2).  Estimated Nutritional Needs:   Kcal:  using IBW of 70kg, BEE: 1380kcals, TEE: (IF 1.1-1.3)(AF 1.2) 1820-2152kcals  Protein:  70-84g protein (1.0-1.2g/kg)  Fluid:  1750-2126mL of fluid (25-46mL/kg)  EDUCATION NEEDS:   No education needs identified at this time   [place], RD, LDN Pager [phone] Weekend/On-Call Pager [phone]

## 2016-01-19 DIAGNOSIS — D696 Thrombocytopenia, unspecified: Secondary | ICD-10-CM

## 2016-01-19 DIAGNOSIS — R918 Other nonspecific abnormal finding of lung field: Secondary | ICD-10-CM

## 2016-01-19 DIAGNOSIS — Z800 Family history of malignant neoplasm of digestive organs: Secondary | ICD-10-CM

## 2016-01-19 DIAGNOSIS — Z8551 Personal history of malignant neoplasm of bladder: Secondary | ICD-10-CM

## 2016-01-19 DIAGNOSIS — I714 Abdominal aortic aneurysm, without rupture: Secondary | ICD-10-CM

## 2016-01-19 DIAGNOSIS — Z87891 Personal history of nicotine dependence: Secondary | ICD-10-CM

## 2016-01-19 DIAGNOSIS — Z8051 Family history of malignant neoplasm of kidney: Secondary | ICD-10-CM

## 2016-01-19 DIAGNOSIS — K859 Acute pancreatitis without necrosis or infection, unspecified: Principal | ICD-10-CM

## 2016-01-19 DIAGNOSIS — E119 Type 2 diabetes mellitus without complications: Secondary | ICD-10-CM

## 2016-01-19 DIAGNOSIS — Z808 Family history of malignant neoplasm of other organs or systems: Secondary | ICD-10-CM

## 2016-01-19 DIAGNOSIS — Z86718 Personal history of other venous thrombosis and embolism: Secondary | ICD-10-CM

## 2016-01-19 DIAGNOSIS — C9110 Chronic lymphocytic leukemia of B-cell type not having achieved remission: Secondary | ICD-10-CM

## 2016-01-19 DIAGNOSIS — N471 Phimosis: Secondary | ICD-10-CM

## 2016-01-19 DIAGNOSIS — C250 Malignant neoplasm of head of pancreas: Secondary | ICD-10-CM

## 2016-01-19 DIAGNOSIS — Z801 Family history of malignant neoplasm of trachea, bronchus and lung: Secondary | ICD-10-CM

## 2016-01-19 LAB — CREATININE, SERUM
Creatinine, Ser: 0.88 mg/dL (ref 0.61–1.24)
GFR calc non Af Amer: >60 mL/min (ref >60)

## 2016-01-19 LAB — GLUCOSE, CAPILLARY
GLUCOSE-CAPILLARY: 226 mg/dL — AB (ref 65–99)
GLUCOSE-CAPILLARY: 78 mg/dL (ref 65–99)
Glucose-Capillary: 102 mg/dL — ABNORMAL HIGH (ref 65–99)
Glucose-Capillary: 120 mg/dL — ABNORMAL HIGH (ref 65–99)

## 2016-01-19 LAB — CBC
HEMATOCRIT: 33.6 % — AB (ref 40.0–52.0)
HEMOGLOBIN: 11.2 g/dL — AB (ref 13.0–18.0)
MCH: 27.9 pg (ref 26.0–34.0)
MCHC: 33.4 g/dL (ref 32.0–36.0)
MCV: 83.6 fL (ref 80.0–100.0)
Platelets: 66 10*3/uL — ABNORMAL LOW (ref 150–440)
RBC: 4.02 MIL/uL — ABNORMAL LOW (ref 4.40–5.90)
RDW: 15.5 % — AB (ref 11.5–14.5)
WBC: 8.1 10*3/uL (ref 3.8–10.6)

## 2016-01-19 NOTE — Progress Notes (Signed)
Patient is discharge home in a stable condition , denies nausea/vomiting , summary and f /u care given to both patient and wife , verbalized understanding, left with wife .

## 2016-01-19 NOTE — Consult Note (Signed)
[place] @ [facility] [phone]  Fax:([phone]) [place]: Aug 28, 1933  MR#: [id number]  NLZ#:[id number]  Patient Care Team: Ezequiel Kayser, MD as PCP - General (Internal Medicine) Clent Jacks, RN as Registered Nurse  CHIEF COMPLAINT:  Chief Complaint  Patient presents with  . Abdominal Pain     No history exists.    Oncology Flowsheet 01/16/2016 01/17/2016 01/18/2016 01/18/2016 01/19/2016  enoxaparin (LOVENOX) [place] - 90 mg 90 mg 90 mg 90 mg  ondansetron (ZOFRAN) IV [ 8 mg ] 4 mg - - -    HISTORY OF PRESENT ILLNESS:   Kenneth Curry is a 82 y.o. Caucasian male with CLL, associated thrombocytopenia, a history of bladder cancer, and a left lower extremity DVT. He was previously seen by Dr. Inez Pilgrim. We discussed ongoing surveillance of his CLL and superficial bladder cancer. Patient had an EUS on 01/10/2016 with Dr. Earlie Counts. 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 This week he developed significant abdominal pain, and was admitted to the hospital. CT of the abdomen showed pancreatitis. Since admission she noticed improvement of abdominal pain. He has not had nausea, he has past gas, but has not had a bowel movement. He has been able to tolerate by mouth intake without difficulties. REVIEW OF SYSTEMS:   Review of Systems  All other systems reviewed and are negative.    PAST MEDICAL HISTORY: Past Medical History  Diagnosis Date  . Abdominal aortic aneurysm ([place])   . Pulmonary nodules   . Thrombocytopenia ([place])   . Presence of IVC filter   . Elevated cholesterol   . Diabetes mellitus, type 2 ([place])   . Cellulitis   . History of primary bladder cancer   . Hematuria   . Urinary frequency   . Phimosis   . Pancreatic adenocarcinoma ([place]) 01/16/2016    PAST SURGICAL HISTORY: Past Surgical History  Procedure Laterality Date  . Cholecystectomy    . Cochlear implant    . Appendectomy    . Abdominal  aortic aneurysm repair    . Upper esophageal endoscopic ultrasound (eus) N/A 01/10/2016    Procedure: UPPER ESOPHAGEAL ENDOSCOPIC ULTRASOUND (EUS);  Surgeon: Cora Daniels, MD;  Location: [facility] ENDOSCOPY;  Service: Endoscopy;  Laterality: N/A;    FAMILY HISTORY Family History  Problem Relation Age of Onset  . Lung cancer Brother   . Colon cancer Brother   . Cancer Sister     Renal  . Cancer Mother 44    Kidney  . Cancer Paternal Uncle     melanoma    ADVANCED DIRECTIVES:  No flowsheet data found.  HEALTH MAINTENANCE: Social History  Substance Use Topics  . Smoking status: Former Smoker -- 1.00 packs/day    Types: Cigarettes    Quit date: 12/28/2015  . Smokeless tobacco: None  . Alcohol Use: No     Allergies  Allergen Reactions  . Hydrocodone-Acetaminophen Other (See Comments)    Other reaction(s): Other (See Comments) intolerate intolerate  . Metformin Diarrhea  . Atorvastatin Rash    Current Facility-Administered Medications  Medication Dose Route Frequency Provider Last Rate Last Dose  . 0.9 %  sodium chloride infusion   Intravenous Continuous Henreitta Leber, MD 125 mL/hr at 01/19/16 0309    . acetaminophen (TYLENOL) tablet 650 mg  650 mg Oral Q6H PRN Henreitta Leber, MD       Or  . acetaminophen (TYLENOL) suppository 650 mg  650 mg  Rectal Q6H PRN Henreitta Leber, MD      . calcium-vitamin D (OSCAL WITH D) 500-200 MG-UNIT per tablet   Oral Daily Henreitta Leber, MD   1 tablet at 01/18/16 0941  . enoxaparin (LOVENOX) injection 90 mg  90 mg Subcutaneous Q12H Henreitta Leber, MD   90 mg at 01/19/16 0820  . insulin aspart (novoLOG) injection 0-5 Units  0-5 Units Subcutaneous QHS Henreitta Leber, MD   0 Units at 01/17/16 2200  . insulin aspart (novoLOG) injection 0-9 Units  0-9 Units Subcutaneous TID WC Henreitta Leber, MD   1 Units at 01/18/16 1700  . levothyroxine (SYNTHROID, LEVOTHROID) tablet 150 mcg  150 mcg Oral QAC breakfast Henreitta Leber, MD   150 mcg  at 01/19/16 0820  . morphine 2 MG/ML injection 2 mg  2 mg Intravenous Q3H PRN Henreitta Leber, MD   2 mg at 01/18/16 1907  . ondansetron (ZOFRAN) tablet 4 mg  4 mg Oral Q6H PRN Henreitta Leber, MD       Or  . ondansetron (ZOFRAN) injection 4 mg  4 mg Intravenous Q6H PRN Henreitta Leber, MD        OBJECTIVE:  Filed Vitals:   01/19/16 0612 01/19/16 0818  BP: 117/60 114/60  Pulse: 48 62  Temp: 98.1 F (36.7 C) 98.3 F (36.8 C)  Resp: 20 18     Body mass index is 30.42 kg/(m^2).    ECOG FS:3 - Symptomatic, >50% confined to bed  Physical Exam  Constitutional: He is oriented to person, place, and time and well-developed, well-nourished, and in no distress. No distress.  Elderly hard of hearing Caucasian male  HENT:  Head: Normocephalic and atraumatic.  Right Ear: External ear normal.  Left Ear: External ear normal.  Nose: Nose normal.  Mouth/Throat: Oropharynx is clear and moist. No oropharyngeal exudate.  Eyes: Conjunctivae and EOM are normal. Pupils are equal, round, and reactive to light. Right eye exhibits no discharge. Left eye exhibits no discharge. No scleral icterus.  Neck: Normal range of motion. Neck supple. No JVD present. No tracheal deviation present. No thyromegaly present.  Cardiovascular: Normal rate, regular rhythm, normal heart sounds and intact distal pulses.  Exam reveals no gallop and no friction rub.   No murmur heard. Pulmonary/Chest: Effort normal and breath sounds normal. No stridor. No respiratory distress. He has no wheezes. He has no rales. He exhibits no tenderness.  Abdominal: Soft. Bowel sounds are normal. He exhibits no distension and no mass. There is tenderness (Epigastric area). There is no rebound and no guarding.  Genitourinary:  Patient deferred  Musculoskeletal: Normal range of motion. He exhibits no edema or tenderness.  Lymphadenopathy:    He has no cervical adenopathy.  Neurological: He is alert and oriented to person, place, and time. He has  normal reflexes. No cranial nerve deficit. He exhibits normal muscle tone. Gait normal. Coordination normal. GCS score is 15.  Skin: Skin is warm and dry. No rash noted. He is not diaphoretic. No erythema. No pallor.  Psychiatric: Mood, memory, affect and judgment normal.  Vitals reviewed.    LAB RESULTS:  CBC Latest Ref Rng 01/19/2016 01/18/2016  WBC 3.8 - 10.6 K/uL 8.1 9.4  Hemoglobin 13.0 - 18.0 g/dL 11.2(L) 11.3(L)  Hematocrit 40.0 - 52.0 % 33.6(L) 34.0(L)  Platelets 150 - 440 K/uL 66(L) 65(L)    Admission on 01/17/2016  Component Date Value Ref Range Status  . Lipase 01/17/2016 20  11 -  51 U/L Final  . Sodium 01/17/2016 140  135 - 145 mmol/L Final  . Potassium 01/17/2016 4.0  3.5 - 5.1 mmol/L Final  . Chloride 01/17/2016 105  101 - 111 mmol/L Final  . CO2 01/17/2016 27  22 - 32 mmol/L Final  . Glucose, Bld 01/17/2016 187* 65 - 99 mg/dL Final  . BUN 01/17/2016 18  6 - 20 mg/dL Final  . Creatinine, Ser 01/17/2016 1.03  0.61 - 1.24 mg/dL Final  . Calcium 01/17/2016 8.9  8.9 - 10.3 mg/dL Final  . Total Protein 01/17/2016 6.5  6.5 - 8.1 g/dL Final  . Albumin 01/17/2016 3.6  3.5 - 5.0 g/dL Final  . AST 01/17/2016 115* 15 - 41 U/L Final  . ALT 01/17/2016 239* 17 - 63 U/L Final  . Alkaline Phosphatase 01/17/2016 105  38 - 126 U/L Final  . Total Bilirubin 01/17/2016 1.8* 0.3 - 1.2 mg/dL Final  . GFR calc non Af Amer 01/17/2016 >60  >60 mL/min Final  . GFR calc Af Amer 01/17/2016 >60  >60 mL/min Final   Comment: (NOTE) The eGFR has been calculated using the CKD EPI equation. This calculation has not been validated in all clinical situations. eGFR's persistently <60 mL/min signify possible Chronic Kidney Disease.   . Anion gap 01/17/2016 8  5 - 15 Final  . WBC 01/17/2016 13.0* 3.8 - 10.6 K/uL Final  . RBC 01/17/2016 4.55  4.40 - 5.90 MIL/uL Final  . Hemoglobin 01/17/2016 12.7* 13.0 - 18.0 g/dL Final  . HCT 01/17/2016 38.0* 40.0 - 52.0 % Final  . MCV 01/17/2016 83.6  80.0 - 100.0  fL Final  . MCH 01/17/2016 27.9  26.0 - 34.0 pg Final  . MCHC 01/17/2016 33.3  32.0 - 36.0 g/dL Final  . RDW 01/17/2016 16.0* 11.5 - 14.5 % Final  . Platelets 01/17/2016 78* 150 - 440 K/uL Final  . Color, Urine 01/17/2016 YELLOW* YELLOW Final  . APPearance 01/17/2016 CLEAR* CLEAR Final  . Glucose, UA 01/17/2016 NEGATIVE  NEGATIVE mg/dL Final  . Bilirubin Urine 01/17/2016 NEGATIVE  NEGATIVE Final  . Ketones, ur 01/17/2016 NEGATIVE  NEGATIVE mg/dL Final  . Specific Gravity, Urine 01/17/2016 1.013  1.005 - 1.030 Final  . Hgb urine dipstick 01/17/2016 1+* NEGATIVE Final  . pH 01/17/2016 5.0  5.0 - 8.0 Final  . Protein, ur 01/17/2016 NEGATIVE  NEGATIVE mg/dL Final  . Nitrite 01/17/2016 NEGATIVE  NEGATIVE Final  . Leukocytes, UA 01/17/2016 NEGATIVE  NEGATIVE Final  . RBC / HPF 01/17/2016 0-5  0 - 5 RBC/hpf Final  . WBC, UA 01/17/2016 0-5  0 - 5 WBC/hpf Final  . Bacteria, UA 01/17/2016 NONE SEEN  NONE SEEN Final  . Squamous Epithelial / LPF 01/17/2016 0-5* NONE SEEN Final  . Mucous 01/17/2016 PRESENT   Final  . Prothrombin Time 01/17/2016 15.5* 11.4 - 15.0 seconds Final  . INR 01/17/2016 1.21   Final  . WBC 01/18/2016 9.4  3.8 - 10.6 K/uL Final  . RBC 01/18/2016 3.99* 4.40 - 5.90 MIL/uL Final  . Hemoglobin 01/18/2016 11.3* 13.0 - 18.0 g/dL Final  . HCT 01/18/2016 34.0* 40.0 - 52.0 % Final  . MCV 01/18/2016 85.3  80.0 - 100.0 fL Final  . MCH 01/18/2016 28.3  26.0 - 34.0 pg Final  . MCHC 01/18/2016 33.2  32.0 - 36.0 g/dL Final  . RDW 01/18/2016 15.6* 11.5 - 14.5 % Final  . Platelets 01/18/2016 65* 150 - 440 K/uL Final  . Sodium 01/18/2016 142  135 -  145 mmol/L Final  . Potassium 01/18/2016 3.7  3.5 - 5.1 mmol/L Final  . Chloride 01/18/2016 110  101 - 111 mmol/L Final  . CO2 01/18/2016 28  22 - 32 mmol/L Final  . Glucose, Bld 01/18/2016 73  65 - 99 mg/dL Final  . BUN 01/18/2016 14  6 - 20 mg/dL Final  . Creatinine, Ser 01/18/2016 0.90  0.61 - 1.24 mg/dL Final  . Calcium 01/18/2016 8.4*  8.9 - 10.3 mg/dL Final  . Total Protein 01/18/2016 5.6* 6.5 - 8.1 g/dL Final  . Albumin 01/18/2016 2.9* 3.5 - 5.0 g/dL Final  . AST 01/18/2016 48* 15 - 41 U/L Final  . ALT 01/18/2016 142* 17 - 63 U/L Final  . Alkaline Phosphatase 01/18/2016 91  38 - 126 U/L Final  . Total Bilirubin 01/18/2016 1.7* 0.3 - 1.2 mg/dL Final  . GFR calc non Af Amer 01/18/2016 >60  >60 mL/min Final  . GFR calc Af Amer 01/18/2016 >60  >60 mL/min Final   Comment: (NOTE) The eGFR has been calculated using the CKD EPI equation. This calculation has not been validated in all clinical situations. eGFR's persistently <60 mL/min signify possible Chronic Kidney Disease.   . Anion gap 01/18/2016 4* 5 - 15 Final  . Prothrombin Time 01/18/2016 15.9* 11.4 - 15.0 seconds Final  . INR 01/18/2016 1.26   Final  . Glucose-Capillary 01/17/2016 120* 65 - 99 mg/dL Final  . Comment 1 01/17/2016 Notify RN   Final  . Glucose-Capillary 01/17/2016 70  65 - 99 mg/dL Final  . Comment 1 01/17/2016 Notify RN   Final  . Glucose-Capillary 01/18/2016 59* 65 - 99 mg/dL Final  . Comment 1 01/18/2016 Notify RN   Final  . Glucose-Capillary 01/18/2016 101* 65 - 99 mg/dL Final  . Comment 1 01/18/2016 Notify RN   Final  . Glucose-Capillary 01/18/2016 74  65 - 99 mg/dL Final  . Comment 1 01/18/2016 Notify RN   Final  . Glucose-Capillary 01/18/2016 59* 65 - 99 mg/dL Final  . Comment 1 01/18/2016 Notify RN   Final  . Glucose-Capillary 01/18/2016 85  65 - 99 mg/dL Final  . Glucose-Capillary 01/18/2016 81  65 - 99 mg/dL Final  . Glucose-Capillary 01/18/2016 63* 65 - 99 mg/dL Final  . Glucose-Capillary 01/18/2016 105* 65 - 99 mg/dL Final  . Comment 1 01/18/2016 Document in Chart   Final  . Glucose-Capillary 01/18/2016 127* 65 - 99 mg/dL Final  . Creatinine, Ser 01/19/2016 0.88  0.61 - 1.24 mg/dL Final  . GFR calc non Af Amer 01/19/2016 >60  >60 mL/min Final  . GFR calc Af Amer 01/19/2016 >60  >60 mL/min Final   Comment: (NOTE) The eGFR has been  calculated using the CKD EPI equation. This calculation has not been validated in all clinical situations. eGFR's persistently <60 mL/min signify possible Chronic Kidney Disease.   . WBC 01/19/2016 8.1  3.8 - 10.6 K/uL Final  . RBC 01/19/2016 4.02* 4.40 - 5.90 MIL/uL Final  . Hemoglobin 01/19/2016 11.2* 13.0 - 18.0 g/dL Final  . HCT 01/19/2016 33.6* 40.0 - 52.0 % Final  . MCV 01/19/2016 83.6  80.0 - 100.0 fL Final  . MCH 01/19/2016 27.9  26.0 - 34.0 pg Final  . MCHC 01/19/2016 33.4  32.0 - 36.0 g/dL Final  . RDW 01/19/2016 15.5* 11.5 - 14.5 % Final  . Platelets 01/19/2016 66* 150 - 440 K/uL Final  . Glucose-Capillary 01/18/2016 76  65 - 99 mg/dL Final  . Edmonia Lynch 01/19/2016  78  65 - 99 mg/dL Final  . Glucose-Capillary 01/19/2016 102* 65 - 99 mg/dL Final  . Glucose-Capillary 01/19/2016 120* 65 - 99 mg/dL Final  . Comment 1 01/19/2016 Notify RN   Final  Office Visit on 01/16/2016  Component Date Value Ref Range Status  . WBC 01/16/2016 13.9* 3.8 - 10.6 K/uL Final  . RBC 01/16/2016 4.91  4.40 - 5.90 MIL/uL Final  . Hemoglobin 01/16/2016 13.5  13.0 - 18.0 g/dL Final  . HCT 01/16/2016 41.2  40.0 - 52.0 % Final  . MCV 01/16/2016 84.0  80.0 - 100.0 fL Final  . MCH 01/16/2016 27.6  26.0 - 34.0 pg Final  . MCHC 01/16/2016 32.8  32.0 - 36.0 g/dL Final  . RDW 01/16/2016 15.3* 11.5 - 14.5 % Final  . Platelets 01/16/2016 91* 150 - 440 K/uL Final  . Neutrophils Relative % 01/16/2016 47%   Final  . Neutro Abs 01/16/2016 6.6* 1.4 - 6.5 K/uL Final  . Lymphocytes Relative 01/16/2016 45%   Final  . Lymphs Abs 01/16/2016 6.3* 1.0 - 3.6 K/uL Final  . Monocytes Relative 01/16/2016 7%   Final  . Monocytes Absolute 01/16/2016 0.9  0.2 - 1.0 K/uL Final  . Eosinophils Relative 01/16/2016 0%   Final  . Eosinophils Absolute 01/16/2016 0.0  0 - 0.7 K/uL Final  . Basophils Relative 01/16/2016 1%   Final  . Basophils Absolute 01/16/2016 0.1  0 - 0.1 K/uL Final  . Sodium 01/16/2016 139  135 - 145  mmol/L Final  . Potassium 01/16/2016 4.0  3.5 - 5.1 mmol/L Final  . Chloride 01/16/2016 104  101 - 111 mmol/L Final  . CO2 01/16/2016 29  22 - 32 mmol/L Final  . Glucose, Bld 01/16/2016 135* 65 - 99 mg/dL Final  . BUN 01/16/2016 17  6 - 20 mg/dL Final  . Creatinine, Ser 01/16/2016 0.93  0.61 - 1.24 mg/dL Final  . Calcium 01/16/2016 9.3  8.9 - 10.3 mg/dL Final  . Total Protein 01/16/2016 6.9  6.5 - 8.1 g/dL Final  . Albumin 01/16/2016 3.8  3.5 - 5.0 g/dL Final  . AST 01/16/2016 472* 15 - 41 U/L Final  . ALT 01/16/2016 390* 17 - 63 U/L Final  . Alkaline Phosphatase 01/16/2016 106  38 - 126 U/L Final  . Total Bilirubin 01/16/2016 3.9* 0.3 - 1.2 mg/dL Final  . GFR calc non Af Amer 01/16/2016 >60  >60 mL/min Final  . GFR calc Af Amer 01/16/2016 >60  >60 mL/min Final   Comment: (NOTE) The eGFR has been calculated using the CKD EPI equation. This calculation has not been validated in all clinical situations. eGFR's persistently <60 mL/min signify possible Chronic Kidney Disease.   . Anion gap 01/16/2016 6  5 - 15 Final  . CEA 01/16/2016 5.6* 0.0 - 4.7 ng/mL Final   Comment: (NOTE)       Roche ECLIA methodology       Nonsmokers  <3.9                                     Smokers     <5.6 Performed At: [facility] [place], [place] [id number] Lindon Romp MD WY:[id number]   . CA 19-9 01/16/2016 287* 0 - 35 U/mL Final   Comment: (NOTE) Roche ECLIA methodology Performed At: [facility] [place], [place] [id number] Lindon Romp MD EM:[id number]  STUDIES: Ct Abdomen Pelvis W Contrast  01/17/2016  CLINICAL DATA:  Abdominal pain with nausea and vomiting since Tuesday. Recently diagnosed with pancreatic cancer. Diabetes. Abdominal swelling. Recent endoscopy. EXAM: CT ABDOMEN AND PELVIS WITH CONTRAST TECHNIQUE: Multidetector CT imaging of the abdomen and pelvis was performed using the standard protocol following bolus administration of  intravenous contrast. CONTRAST:  159m OMNIPAQUE IOHEXOL 300 MG/ML  SOLN COMPARISON:  12/09/2015 and report of the endoscopic ultrasound of 01/10/2016. FINDINGS: Lower chest: Clear lung bases. Mild cardiomegaly, without pericardial or pleural effusion. A small hiatal hernia. Hepatobiliary: Normal liver. Cholecystectomy. Intrahepatic ducts upper normal, without common duct dilatation. Pancreas: Redemonstration of findings consistent with main duct intraductal papillary mucinous tumor. Marked pancreatic atrophy with main duct dilatation. Cystic component or area of focal main duct dilatation measures 2.0 cm on image 29/series 2, similar. Cystic lesion either adjacent to or exophytic off the pancreatic head measures 2.9 by 3.0 cm versus 2.7 x 3.3 cm on the prior. Similar. development of moderate peripancreatic edema, without peripancreatic fluid collection or new cystic lesion. Spleen: Splenomegaly, 16.4 cm craniocaudal. Adrenals/Urinary Tract: Upper pole right renal cyst of 4.5 cm. Normal left kidney, without hydronephrosis. Normal urinary bladder. Stomach/Bowel: Edema within and adjacent the gastric antrum and proximal duodenum, likely secondary to the pancreatic process. Example image 28/series 2. Colonic stool burden suggests constipation. Normal terminal ileum. Otherwise normal small bowel Vascular/Lymphatic: Status post aortic endograft repair, without acute complication. IVC filter is unchanged in position. No abdominopelvic adenopathy. Reproductive: Normal prostate. Other: No significant free fluid. Musculoskeletal: Left hip osteoarthritis.  Lumbosacral spondylosis. IMPRESSION: 1. Development of pancreatitis. This is superimposed upon similar findings which are consistent with main duct intraductal papillary mucinous tumor. Please see endoscopic ultrasound report for further description. 2. gastric antral and proximal duodenal inflammation, likely secondary. Electronically Signed   By: KAbigail MiyamotoM.D.   On:  01/17/2016 13:26    ASSESSMENT and MEDICAL DECISION MAKING:  Adenocarcinoma of the head of the pancreas, likely stage II a (T2 N0 M0)-patient will need a PET scan for complete staging, however, in the setting of pancreatitis PET scan could be falsely positive, including positivity in the lymph nodes, secondary to inflammation. It makes sense to delay PET scan until resolution of the symptoms, so it should be performed next week on an outpatient basis. We will consider administering neoadjuvant chemotherapy, gemcitabine and Abraxane, possibly followed by radiation with 5-FU or capecitabine. Patient will be followed by Dr. CMike Gipin our oncology clinic after discharge. Please inform uKoreawhen patient is ready for discharge, so we can make preparations for the PET scan and a follow-up visit.  Patient expressed understanding and was in agreement with this plan. He also understands that He can call clinic at any time with any questions, concerns, or complaints.    No matching staging information was found for the patient.  DRoxana Hires MD   01/19/2016 9:11 AM

## 2016-01-19 NOTE — Discharge Summary (Signed)
[facility] NAME: Kenneth Curry    MR#:  [id number]  DATE OF BIRTH:  [birth year]  DATE OF ADMISSION:  01/17/2016 ADMITTING PHYSICIAN: Henreitta Leber, MD  DATE OF DISCHARGE: 01/19/2016  3:07 PM  PRIMARY CARE PHYSICIAN: Raechel Ache, DAVID, MD    ADMISSION DIAGNOSIS:  abd back pain  DISCHARGE DIAGNOSIS:  Active Problems:   Acute pancreatitis   SECONDARY DIAGNOSIS:   Past Medical History  Diagnosis Date  . Abdominal aortic aneurysm ([place])   . Pulmonary nodules   . Thrombocytopenia ([place])   . Presence of IVC filter   . Elevated cholesterol   . Diabetes mellitus, type 2 ([place])   . Cellulitis   . History of primary bladder cancer   . Hematuria   . Urinary frequency   . Phimosis   . Pancreatic adenocarcinoma ([place]) 01/16/2016    HOSPITAL COURSE:   82 year old male with past medical history of diabetes, hypertension, history of bladder cancer, thrombocytopenia, history of previous DVT status post IVC filter, pancreatic mass thought to be malignant who presented to the hospital due to abdominal pain nausea vomiting and noted to have acute pancreatitis.  #1 acute pancreatitis-this was the cause of patient's worsening abdominal pain nausea and vomiting. - cause was post biopsy about a week ago. Patient had no evidence of alcohol abuse or any biliary pathology to explain his acute pancreatitis. -Patient was admitted to the hospital and started on supportive therapy with IV fluids, IV pain control and antiemetics. After aggressive supportive care patient's pain improved. He was started on a clear liquid diet and then advanced to a low-fat which she is not tolerating without any further exacerbation of his abdominal pain nausea vomiting. His lipase has remained normal. He is presently being discharged home with follow-up with oncology as an outpatient.  #2 pancreatic mass status post biopsy-this is thought to be malignant in nature. S/p  biopsy consistent with Malignancy.  - Will likely need PET scan but will wait to do it as pt. Has pancreatitis and acute inflammation which could give [place] a false + result.  -he was seen by oncology who will arrange for a PET scan to be done as an outpatient within the next week to 80.  #3 type 2 diabetes without complication-he had one episode of hypoglycemia as he was nothing by mouth initially. That improved with some dextrose. While in the hospital he was maintained on sliding scale insulin but now is being discharged back on his home dose Lantus and glimepiride as stated.  #4 history of previous DVT-patient will continue his Lovenox, Coumadin. His INR on 01/18/2016 was 1.2. His INR can be further followed up at the cancer center. Patient is also status post IVC filter in the past.  #5 GERD-patient will resume his omeprazole.  #6 hypothyroidism-patient will resume his Synthroid.Marland Kitchen  DISCHARGE CONDITIONS:   Stable  CONSULTS OBTAINED:  Treatment Team:  Roxana Hires, MD  DRUG ALLERGIES:   Allergies  Allergen Reactions  . Hydrocodone-Acetaminophen Other (See Comments)    Other reaction(s): Other (See Comments) intolerate intolerate  . Metformin Diarrhea  . Atorvastatin Rash    DISCHARGE MEDICATIONS:   Discharge Medication List as of 01/19/2016  1:26 PM    CONTINUE these medications which have NOT CHANGED   Details  Calcium Carbonate-Vitamin D (CALCIUM-D PO) Take 1 tablet by mouth daily. , Until Discontinued, Historical Med    enoxaparin (LOVENOX) 100 MG/ML injection Inject 0.9 mLs (90  mg total) into the skin every 12 (twelve) hours. Take as directed by MD (on 01/29, 01/30, and 01/08/2016) then hold prior to procedure.  Restart after procedure when directed by MD, Starting 01/03/2016, Until Discontinued, Print    glimepiride (AMARYL) 4 MG tablet Take 1 tablet (4 mg) in the morning and 1 tablet (4 mg) in the evening with food., Historical Med    Insulin Glargine (LANTUS) 100  UNIT/ML Solostar Pen Inject 6 Units into the skin daily at 10 pm. , Starting 04/16/2015, Until Discontinued, Historical Med    levothyroxine (SYNTHROID, LEVOTHROID) 150 MCG tablet Take 150 mcg by mouth daily before breakfast. , Starting 05/30/2014, Until Discontinued, Historical Med    lovastatin (MEVACOR) 20 MG tablet Take 20 mg by mouth at bedtime. , Starting 11/15/2014, Until Discontinued, Historical Med    NASAL DECONGESTANT SPRAY 0.05 % nasal spray Place 1 spray into both nostrils 2 (two) times daily as needed for congestion. , Starting 11/21/2015, Until Discontinued, Historical Med    OMEPRAZOLE PO Take 20 mg by mouth daily., Until Discontinued, Historical Med    oxyCODONE (OXY IR/ROXICODONE) 5 MG immediate release tablet Take 1 tablet (5 mg total) by mouth every 4 (four) hours as needed for severe pain., Starting 01/16/2016, Until Discontinued, Print    promethazine (PHENERGAN) 25 MG tablet Take 25 mg by mouth every 6 (six) hours as needed. , Starting 01/15/2016, Until Discontinued, Historical Med    sitaGLIPtin (JANUVIA) 100 MG tablet Take 100 mg by mouth daily. , Starting 01/01/2015, Until Discontinued, Historical Med    warfarin (COUMADIN) 5 MG tablet Take 5 mg by mouth daily. Take 2 tablets daily ~ 5 days a week Take 2.5 tablets on Weds and Saturdays, Until Discontinued, Historical Med         DISCHARGE INSTRUCTIONS:   DIET:  Cardiac diet and Diabetic diet  DISCHARGE CONDITION:  Stable  ACTIVITY:  Activity as tolerated  OXYGEN:  Home Oxygen: No.   Oxygen Delivery: room air  DISCHARGE LOCATION:  home   If you experience worsening of your admission symptoms, develop shortness of breath, life threatening emergency, suicidal or homicidal thoughts you must seek medical attention immediately by calling 911 or calling your MD immediately  if symptoms less severe.  You Must read complete instructions/literature along with all the possible adverse reactions/side effects for all  the Medicines you take and that have been prescribed to you. Take any new Medicines after you have completely understood and accpet all the possible adverse reactions/side effects.   Please note  You were cared for by a hospitalist during your hospital stay. If you have any questions about your discharge medications or the care you received while you were in the hospital after you are discharged, you can call the unit and asked to speak with the hospitalist on call if the hospitalist that took care of you is not available. Once you are discharged, your primary care physician will handle any further medical issues. Please note that NO REFILLS for any discharge medications will be authorized once you are discharged, as it is imperative that you return to your primary care physician (or establish a relationship with a primary care physician if you do not have one) for your aftercare needs so that they can reassess your need for medications and monitor your lab values.     Today   Abdominal pain resolved. No nausea or vomiting overnight. Tolerating a regular diet well.  VITAL SIGNS:  Blood pressure 114/60, pulse 62,  temperature 98.3 F (36.8 C), temperature source Oral, resp. rate 18, height 5\' 8"  (1.727 m), weight 90.719 kg (200 lb), SpO2 93 %.  I/O:   Intake/Output Summary (Last 24 hours) at 01/19/16 1552 Last data filed at 01/19/16 1318  Gross per 24 hour  Intake 7615.5 ml  Output   1600 ml  Net 6015.5 ml    PHYSICAL EXAMINATION:  GENERAL:  82 y.o.-year-old obese patient lying in the bed with no acute distress.  EYES: Pupils equal, round, reactive to light and accommodation. No scleral icterus. Extraocular muscles intact.  HEENT: Head atraumatic, normocephalic. Oropharynx and nasopharynx clear.  NECK:  Supple, no jugular venous distention. No thyroid enlargement, no tenderness.  LUNGS: Normal breath sounds bilaterally, no wheezing, rales,rhonchi. No use of accessory muscles of  respiration.  CARDIOVASCULAR: S1, S2 normal. No murmurs, rubs, or gallops.  ABDOMEN: Soft, non-tender, non-distended. Bowel sounds present. No organomegaly or mass.  EXTREMITIES: No pedal edema, cyanosis, or clubbing.  NEUROLOGIC: Cranial nerves II through XII are intact. No focal motor or sensory defecits b/l.  PSYCHIATRIC: The patient is alert and oriented x 3. Good affect.  SKIN: No obvious rash, lesion, or ulcer.   DATA REVIEW:   CBC  Recent Labs Lab 01/19/16 0623  WBC 8.1  HGB 11.2*  HCT 33.6*  PLT 66*    Chemistries   Recent Labs Lab 01/18/16 0427 01/19/16 0623  NA 142  --   K 3.7  --   CL 110  --   CO2 28  --   GLUCOSE 73  --   BUN 14  --   CREATININE 0.90 0.88  CALCIUM 8.4*  --   AST 48*  --   ALT 142*  --   ALKPHOS 91  --   BILITOT 1.7*  --     Cardiac Enzymes No results for input(s): TROPONINI in the last 168 hours.  RADIOLOGY:  No results found.    Management plans discussed with the patient, family and they are in agreement.  CODE STATUS:     Code Status Orders        Start     Ordered   01/17/16 1752  Full code   Continuous     01/17/16 1751    Code Status History    Date Active Date Inactive Code Status Order ID Comments User Context   This patient has a current code status but no historical code status.    Advance Directive Documentation        Most Recent Value   Type of Advance Directive  Healthcare Power of Attorney   Pre-existing out of facility DNR order (yellow form or pink MOST form)     "MOST" Form in Place?        TOTAL TIME TAKING CARE OF THIS PATIENT: 40 minutes.    Henreitta Leber M.D on 01/19/2016 at 3:52 PM  Between 7am to 6pm - Pager - [phone]  After 6pm go to [email or web address] - password [email or web address] [facility]  Office  [phone]  CC: Primary care physician; Ezequiel Kayser, MD

## 2016-01-21 ENCOUNTER — Inpatient Hospital Stay: Payer: Commercial Managed Care - HMO

## 2016-01-21 ENCOUNTER — Telehealth: Payer: Self-pay | Admitting: Family Medicine

## 2016-01-21 ENCOUNTER — Telehealth: Payer: Self-pay | Admitting: Hematology and Oncology

## 2016-01-21 ENCOUNTER — Other Ambulatory Visit: Payer: Self-pay | Admitting: Family Medicine

## 2016-01-21 DIAGNOSIS — D696 Thrombocytopenia, unspecified: Secondary | ICD-10-CM | POA: Diagnosis not present

## 2016-01-21 DIAGNOSIS — R161 Splenomegaly, not elsewhere classified: Secondary | ICD-10-CM | POA: Diagnosis not present

## 2016-01-21 DIAGNOSIS — E119 Type 2 diabetes mellitus without complications: Secondary | ICD-10-CM | POA: Diagnosis not present

## 2016-01-21 DIAGNOSIS — Z8551 Personal history of malignant neoplasm of bladder: Secondary | ICD-10-CM | POA: Diagnosis not present

## 2016-01-21 DIAGNOSIS — R918 Other nonspecific abnormal finding of lung field: Secondary | ICD-10-CM | POA: Diagnosis not present

## 2016-01-21 DIAGNOSIS — F17210 Nicotine dependence, cigarettes, uncomplicated: Secondary | ICD-10-CM | POA: Diagnosis not present

## 2016-01-21 DIAGNOSIS — Z86718 Personal history of other venous thrombosis and embolism: Secondary | ICD-10-CM | POA: Diagnosis not present

## 2016-01-21 DIAGNOSIS — C259 Malignant neoplasm of pancreas, unspecified: Secondary | ICD-10-CM

## 2016-01-21 DIAGNOSIS — R319 Hematuria, unspecified: Secondary | ICD-10-CM | POA: Diagnosis not present

## 2016-01-21 DIAGNOSIS — N471 Phimosis: Secondary | ICD-10-CM | POA: Diagnosis not present

## 2016-01-21 DIAGNOSIS — E7800 Pure hypercholesterolemia, unspecified: Secondary | ICD-10-CM | POA: Diagnosis not present

## 2016-01-21 DIAGNOSIS — R109 Unspecified abdominal pain: Secondary | ICD-10-CM | POA: Diagnosis not present

## 2016-01-21 DIAGNOSIS — K859 Acute pancreatitis without necrosis or infection, unspecified: Secondary | ICD-10-CM | POA: Diagnosis not present

## 2016-01-21 DIAGNOSIS — C9110 Chronic lymphocytic leukemia of B-cell type not having achieved remission: Secondary | ICD-10-CM | POA: Diagnosis not present

## 2016-01-21 DIAGNOSIS — I82409 Acute embolism and thrombosis of unspecified deep veins of unspecified lower extremity: Secondary | ICD-10-CM

## 2016-01-21 LAB — PROTIME-INR
INR: 1.09 (ref 0.00–1.49)
PROTHROMBIN TIME: 14.3 s (ref 11.6–15.2)

## 2016-01-21 NOTE — Telephone Encounter (Signed)
Patient came in for labwork this morning and indicated that he does not know how to take his medicine and he is confused and said he wants to go back to taking his medicine the way he always has. I asked him to please talk to team [place] before making any changes to his current medication schedule. Please call patient to advise. Thanks.

## 2016-01-21 NOTE — Telephone Encounter (Signed)
  Brandy,  Can you call the patient and find out about this medication issue?  M

## 2016-01-21 NOTE — Telephone Encounter (Signed)
Called patient and instructions given to wife to increase coumadin to 12.5mg  daily and continue with current Lovenox BID. To have PT/ INR checked again on Wednesday prior to PET scan.

## 2016-01-23 ENCOUNTER — Other Ambulatory Visit: Payer: Self-pay

## 2016-01-23 ENCOUNTER — Inpatient Hospital Stay: Payer: Commercial Managed Care - HMO

## 2016-01-23 ENCOUNTER — Other Ambulatory Visit: Payer: Self-pay | Admitting: Family Medicine

## 2016-01-23 ENCOUNTER — Encounter
Admission: RE | Admit: 2016-01-23 | Discharge: 2016-01-23 | Disposition: A | Discharge status: Home / Self Care | Payer: Commercial Managed Care - HMO | Source: Ambulatory Visit | Attending: Family Medicine | Admitting: Family Medicine

## 2016-01-23 ENCOUNTER — Telehealth: Payer: Self-pay | Admitting: Hematology and Oncology

## 2016-01-23 DIAGNOSIS — C259 Malignant neoplasm of pancreas, unspecified: Secondary | ICD-10-CM

## 2016-01-23 DIAGNOSIS — I82409 Acute embolism and thrombosis of unspecified deep veins of unspecified lower extremity: Secondary | ICD-10-CM

## 2016-01-23 DIAGNOSIS — Z7901 Long term (current) use of anticoagulants: Secondary | ICD-10-CM

## 2016-01-23 LAB — GLUCOSE, CAPILLARY: GLUCOSE-CAPILLARY: 53 mg/dL — AB (ref 65–99)

## 2016-01-23 LAB — PROTIME-INR
INR: 1.35 (ref 0.00–1.49)
PROTHROMBIN TIME: 16.8 s — AB (ref 11.6–15.2)

## 2016-01-23 MED ORDER — WARFARIN SODIUM 5 MG PO TABS: ORAL_TABLET | ORAL | Status: DC

## 2016-01-23 MED ORDER — FLUDEOXYGLUCOSE F - 18 (FDG) INJECTION
12.2200 | Freq: Once | INTRAVENOUS | Status: AC | PRN
  Administered 2016-01-23: 12.22 via INTRAVENOUS

## 2016-01-23 NOTE — Telephone Encounter (Signed)
Patient is confused about whether or not to take his Lovenox tonight and also has questions about his sugar pill. Please call him ASAP at home to clarify. He said you can talk to Mechele Claude, his wife.

## 2016-01-24 ENCOUNTER — Encounter: Payer: Self-pay | Admitting: Hematology and Oncology

## 2016-01-24 ENCOUNTER — Inpatient Hospital Stay: Payer: Commercial Managed Care - HMO

## 2016-01-24 ENCOUNTER — Inpatient Hospital Stay (HOSPITAL_BASED_OUTPATIENT_CLINIC_OR_DEPARTMENT_OTHER): Payer: Commercial Managed Care - HMO | Admitting: Hematology and Oncology

## 2016-01-24 ENCOUNTER — Other Ambulatory Visit: Payer: Self-pay

## 2016-01-24 VITALS — BP 147/82 | HR 88 | Temp 97.9°F | Resp 19 | Wt 206.9 lb

## 2016-01-24 DIAGNOSIS — E119 Type 2 diabetes mellitus without complications: Secondary | ICD-10-CM

## 2016-01-24 DIAGNOSIS — D696 Thrombocytopenia, unspecified: Secondary | ICD-10-CM | POA: Diagnosis not present

## 2016-01-24 DIAGNOSIS — C259 Malignant neoplasm of pancreas, unspecified: Secondary | ICD-10-CM

## 2016-01-24 DIAGNOSIS — R109 Unspecified abdominal pain: Secondary | ICD-10-CM | POA: Diagnosis not present

## 2016-01-24 DIAGNOSIS — R319 Hematuria, unspecified: Secondary | ICD-10-CM

## 2016-01-24 DIAGNOSIS — R918 Other nonspecific abnormal finding of lung field: Secondary | ICD-10-CM | POA: Diagnosis not present

## 2016-01-24 DIAGNOSIS — R1013 Epigastric pain: Secondary | ICD-10-CM

## 2016-01-24 DIAGNOSIS — C9110 Chronic lymphocytic leukemia of B-cell type not having achieved remission: Secondary | ICD-10-CM | POA: Diagnosis not present

## 2016-01-24 DIAGNOSIS — Z86718 Personal history of other venous thrombosis and embolism: Secondary | ICD-10-CM

## 2016-01-24 DIAGNOSIS — Z7901 Long term (current) use of anticoagulants: Secondary | ICD-10-CM

## 2016-01-24 DIAGNOSIS — K859 Acute pancreatitis without necrosis or infection, unspecified: Secondary | ICD-10-CM | POA: Diagnosis not present

## 2016-01-24 DIAGNOSIS — E7800 Pure hypercholesterolemia, unspecified: Secondary | ICD-10-CM | POA: Diagnosis not present

## 2016-01-24 DIAGNOSIS — R638 Other symptoms and signs concerning food and fluid intake: Secondary | ICD-10-CM

## 2016-01-24 DIAGNOSIS — F17210 Nicotine dependence, cigarettes, uncomplicated: Secondary | ICD-10-CM

## 2016-01-24 DIAGNOSIS — N471 Phimosis: Secondary | ICD-10-CM

## 2016-01-24 DIAGNOSIS — Z8551 Personal history of malignant neoplasm of bladder: Secondary | ICD-10-CM

## 2016-01-24 DIAGNOSIS — R161 Splenomegaly, not elsewhere classified: Secondary | ICD-10-CM

## 2016-01-24 LAB — COMPREHENSIVE METABOLIC PANEL
ALT: 25 U/L (ref 17–63)
ANION GAP: 7 (ref 5–15)
AST: 12 U/L — AB (ref 15–41)
Albumin: 3.4 g/dL — ABNORMAL LOW (ref 3.5–5.0)
Alkaline Phosphatase: 78 U/L (ref 38–126)
BILIRUBIN TOTAL: 0.6 mg/dL (ref 0.3–1.2)
BUN: 9 mg/dL (ref 6–20)
CHLORIDE: 104 mmol/L (ref 101–111)
CO2: 25 mmol/L (ref 22–32)
Calcium: 8.6 mg/dL — ABNORMAL LOW (ref 8.9–10.3)
Creatinine, Ser: 0.92 mg/dL (ref 0.61–1.24)
Glucose, Bld: 171 mg/dL — ABNORMAL HIGH (ref 65–99)
POTASSIUM: 3.6 mmol/L (ref 3.5–5.1)
Sodium: 136 mmol/L (ref 135–145)
TOTAL PROTEIN: 6.9 g/dL (ref 6.5–8.1)

## 2016-01-24 LAB — CBC WITH DIFFERENTIAL/PLATELET
Basophils Absolute: 0.1 10*3/uL (ref 0–0.1)
Basophils Relative: 0 %
EOS PCT: 1 %
Eosinophils Absolute: 0.1 10*3/uL (ref 0–0.7)
HEMATOCRIT: 38.3 % — AB (ref 40.0–52.0)
Hemoglobin: 12.9 g/dL — ABNORMAL LOW (ref 13.0–18.0)
LYMPHS PCT: 47 %
Lymphs Abs: 6.3 10*3/uL — ABNORMAL HIGH (ref 1.0–3.6)
MCH: 27.8 pg (ref 26.0–34.0)
MCHC: 33.8 g/dL (ref 32.0–36.0)
MCV: 82.2 fL (ref 80.0–100.0)
MONO ABS: 0.5 10*3/uL (ref 0.2–1.0)
MONOS PCT: 4 %
NEUTROS ABS: 6.5 10*3/uL (ref 1.4–6.5)
Neutrophils Relative %: 48 %
PLATELETS: 137 10*3/uL — AB (ref 150–440)
RBC: 4.66 MIL/uL (ref 4.40–5.90)
RDW: 15 % — AB (ref 11.5–14.5)
WBC: 13.6 10*3/uL — ABNORMAL HIGH (ref 3.8–10.6)

## 2016-01-24 LAB — PROTIME-INR
INR: 1.66
Prothrombin Time: 19.6 seconds — ABNORMAL HIGH (ref 11.4–15.0)

## 2016-01-24 LAB — LIPASE, BLOOD: Lipase: 20 U/L (ref 11–51)

## 2016-01-24 LAB — AMYLASE: Amylase: 35 U/L (ref 28–100)

## 2016-01-24 NOTE — Progress Notes (Signed)
[facility] day:  01/24/2016   Chief Complaint: Kenneth Curry is a 82 y.o. 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  HPI:   The patient  was last seen in the medical oncology clinic on 01/02/2016.  At that time, we discussed plans for Lovenox bridging around his upcoming ERCP/endoscopic ultrasound for pancreatic ductal dilatation with associated mass of the uncinate process.  He underwent upper endoscopic ultrasound on 01/10/2016 by Dr. Vida Roller.  Findings included a gaping papilla with extruding mucin diagnostic of IPMN (intraductal papillary mucinous neoplasm).  There was a mildly supsicious hypoechoic area was identified in the pancreatic head. Given upstream dilation of the duct and close association with the larger cyst, fine needle aspiration was performed. The entire pancreatic duct in the proximal head out to the tail was dilated, thick walled, with mural nodularity, suggestive of diffuse main duct IPMN.  A cystic lesion was seen in the pancreatic head. Tissue was obtained.  Fine needle aspiration for fluid performed.  A cystic lesion was seen in the genu of the pancreas. Tissue was obtained. A few benign lymph nodes were visualized in the porta hepatis region. Tissue was not obtained  He was told to resume Lovenox at the prior dose in 3 days.  He states that when he started the Lovenox, he started Coumadin 10 mg a day.  He was to take ciprofloxacin for 5 days.  FNA of the pancreatic head revealed adenocarcinoma in a background of intraductal papillary mucinous neoplasm.  He was seen in the medical oncology clinic by Georgeanne Nim, NP, on 01/16/2016.   At that time, he notes 9 out of 10 abdominal pain  radiating to his back, nausea, and vomiting.  Pain started on 01/15/2016.  Labs included a bilirubin 3.9, AST 472, and ALT 390 (labs were normal on 01/02/2016).  CA19-9 was 287.  CEA was 5.6.  He refused to be evaluated in the ER.  Fluids, ondansetron, and morphine were given in clinic.  A prescription for oxycodone 5 mg every 6 hours as need was provided.  Januvia was held.  A PET scan was ordered.  He was admitted to [facility] with acute pancreatitis from 01/17/2016 - 01/19/2016.  Abdominal CT scan revealed pancreatitis with gastric antral and proximal duodenal inflammation.  He was treated with pain medications, IVF and anti-emetics.  He was started on a clear liquid diet and then advanced to a low-fat diet.  Pain and nausea were not exacerbated.  Lipase remained normal.  He was discharged on Lovenox 90 mg SQ q 12 hours and Coumadin 10 mg a day x 5 days and 12.5 mg on Wednesdays and Saturdays.  INR was 1.26 on 01/18/2016.  His baseline Coumadin dose was 10 mg a day.  On 01/21/2016, his Coumadin was increased to 12.5 mg a day with Lovenox 90 mg q 12 hours.  INR was 1.09 on 01/21/2016 and 1.35 on 01/23/2016.  He has had very little to eat or dink.  He denies any bruising or bleeding except at the site of the injections.  PET scan on 01/23/2016 revealed focal hypermetabolism within the pancreatic head with cysts, ductal dilatation and gland atrophy in the remaining pancreas.  Findings are consistent with the pathologic diagnosis of mixed pancreatic adenocarcinoma  and intraductal papillary mucinous neoplasm.  There was mild hypermetabolism within a portacaval lymph node, indicating involvement.   There was no distant metastatic disease.  There was persistent peripancreatic haziness, indicative of residual pancreatitis.  There was a small pelvic free fluid.  Symptomatically, he continue to have upper abdominal pain. His nausea has improved.  He is no longer taking anti-emetics.  His oral intake is minimal.  He is  drinking and voiding.  Blood sugar as been erratic.  He notes that it hasn't been low (50-60) like when he was in the hospital.  He just started taking his glipizide again.   Past Medical History  Diagnosis Date  . Abdominal aortic aneurysm ([place])   . Pulmonary nodules   . Thrombocytopenia ([place])   . Presence of IVC filter   . Elevated cholesterol   . Diabetes mellitus, type 2 ([place])   . Cellulitis   . History of primary bladder cancer   . Hematuria   . Urinary frequency   . Phimosis   . Pancreatic adenocarcinoma ([place]) 01/16/2016    Past Surgical History  Procedure Laterality Date  . Cholecystectomy    . Cochlear implant    . Appendectomy    . Abdominal aortic aneurysm repair    . Upper esophageal endoscopic ultrasound (eus) N/A 01/10/2016    Procedure: UPPER ESOPHAGEAL ENDOSCOPIC ULTRASOUND (EUS);  Surgeon: Cora Daniels, MD;  Location: [facility] ENDOSCOPY;  Service: Endoscopy;  Laterality: N/A;    Family History  Problem Relation Age of Onset  . Lung cancer Brother   . Colon cancer Brother   . Cancer Sister     Renal  . Cancer Mother 60    Kidney  . Cancer Paternal Uncle     melanoma    Social History:  reports that he quit smoking about 3 weeks ago. His smoking use included Cigarettes. He smoked 1.00 pack per day. He does not have any smokeless tobacco history on file. He reports that he does not drink alcohol. His drug history is not on file.  He previously smoked < 1/2 pack per day.  He stopped smoking on 12/28/2015.  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  The patient is accompanied by his wife today.  Allergies:  Allergies  Allergen Reactions  . Hydrocodone-Acetaminophen Other (See Comments)    Other reaction(s): Other (See Comments) intolerate intolerate  . Metformin Diarrhea  . Atorvastatin Rash    Current Medications: Current Outpatient Prescriptions  Medication Sig Dispense Refill  . Acetaminophen 500 MG coapsule     . Calcium Carbonate-Vitamin D  (CALCIUM-D PO) Take 1 tablet by mouth daily.     . ciprofloxacin (CIPRO) 500 MG tablet Take 500 mg by mouth 2 (two) times daily.  0  . enoxaparin (LOVENOX) 100 MG/ML injection Inject 0.9 mLs (90 mg total) into the skin every 12 (twelve) hours. Take as directed by MD (on 01/29, 01/30, and 01/08/2016) then hold prior to procedure.  Restart after procedure when directed by MD 10 Syringe 2  . glimepiride (AMARYL) 4 MG tablet     . JANUVIA 100 MG tablet     . LANTUS SOLOSTAR 100 UNIT/ML Solostar Pen INJECT 24 UNITS SUBCUTANEOUSLY ONCE DAILY INTO ABDOMEN,THIGHS,OUTER ARMS EVERY NIGHT. ROTATE SITES  3  . levothyroxine (SYNTHROID, LEVOTHROID) 150 MCG tablet Take 150 mcg by mouth daily before breakfast.     . lovastatin (MEVACOR) 20 MG tablet Take 20 mg by mouth at bedtime.     Marland Kitchen  NASAL DECONGESTANT SPRAY 0.05 % nasal spray Place 1 spray into both nostrils 2 (two) times daily as needed for congestion.     Marland Kitchen OMEPRAZOLE PO Take 20 mg by mouth daily.    Marland Kitchen oxyCODONE (OXY IR/ROXICODONE) 5 MG immediate release tablet Take 1 tablet (5 mg total) by mouth every 4 (four) hours as needed for severe pain. 30 tablet 0  . promethazine (PHENERGAN) 25 MG tablet Take 25 mg by mouth every 6 (six) hours as needed.     . warfarin (COUMADIN) 5 MG tablet 12.22m every day 30 tablet 0   No current facility-administered medications for this visit.    Review of Systems:  GENERAL:  Feels tired.  No fevers, sweats or weight loss. PERFORMANCE STATUS (ECOG):  2 HEENT:  No visual changes, runny nose, sore throat, mouth sores or tenderness. Lungs: No shortness of breath or cough.  No hemoptysis. Cardiac:  No chest pain, palpitations, orthopnea, or PND. GI:  Right sided/epigastric abdominal pain, improved since discharge from the hospital.  Poor appetite (food bothers stomach).  No nausea, vomiting, diarrhea, constipation, melena or hematochezia. GU:  No urgency, frequency, dysuria, or hematuria. Musculoskeletal:  No back pain.  No  joint pain.  No muscle tenderness. Extremities:  No pain or swelling. Skin:  No rashes or skin changes. Neuro:  No headache, numbness or weakness, balance or coordination issues. Endocrine:  Diabetes.  Recently started back on glipizide.  Thyroid disease on Synthroid.  No hot flashes or night sweats. Psych:  No mood changes, depression or anxiety. Pain:  No focal pain. Review of systems:  All other systems reviewed and found to be negative.  Physical Exam: Blood pressure 147/82, pulse 88, temperature 97.9 F (36.6 C), temperature source Tympanic, resp. rate 19, weight 206 lb 14.4 oz (93.85 kg). GENERAL:  Well developed, well nourished, sitting comfortably in the exam room in no acute distress. MENTAL STATUS:  Alert and oriented to person, place and time. HEAD:  Wearing a cap.  Short gray hair.  Normocephalic, atraumatic, face symmetric, no Cushingoid features. EYES:  Glasses.  Blue eyes.  Pupils equal round and reactive to light and accomodation.  No conjunctivitis or scleral icterus. ENT:  Right cochlear implant.  Oropharynx clear without lesion.  Tongue normal. Mucous membranes moist.  RESPIRATORY:  Clear to auscultation without rales, wheezes or rhonchi. CARDIOVASCULAR:  Regular rate and rhythm.  No murmur, rub or gallop. ABDOMEN:  Soft, slightly tender in the epigastric region/right upper quadrant.  No guarding or rebound tenderness.  Active bowel sounds with no hepatomegaly.  Spleen tip palpable.  No masses. SKIN:  No rashes, ulcers or lesions. EXTREMITIES: No edema, skin discoloration or tenderness.  No palpable cords. LYMPH NODES: No palpable cervical, supraclavicular, axillary or inguinal adenopathy  NEUROLOGICAL: Unremarkable. PSYCH:  Appropriate.  Appointment on 01/24/2016  Component Date Value Ref Range Status  . WBC 01/24/2016 13.6* 3.8 - 10.6 K/uL Final  . RBC 01/24/2016 4.66  4.40 - 5.90 MIL/uL Final  . Hemoglobin 01/24/2016 12.9* 13.0 - 18.0 g/dL Final  . HCT 01/24/2016  38.3* 40.0 - 52.0 % Final  . MCV 01/24/2016 82.2  80.0 - 100.0 fL Final  . MCH 01/24/2016 27.8  26.0 - 34.0 pg Final  . MCHC 01/24/2016 33.8  32.0 - 36.0 g/dL Final  . RDW 01/24/2016 15.0* 11.5 - 14.5 % Final  . Platelets 01/24/2016 137* 150 - 440 K/uL Final  . Neutrophils Relative % 01/24/2016 48   Final  . Neutro Abs  01/24/2016 6.5  1.4 - 6.5 K/uL Final  . Lymphocytes Relative 01/24/2016 47   Final  . Lymphs Abs 01/24/2016 6.3* 1.0 - 3.6 K/uL Final  . Monocytes Relative 01/24/2016 4   Final  . Monocytes Absolute 01/24/2016 0.5  0.2 - 1.0 K/uL Final  . Eosinophils Relative 01/24/2016 1   Final  . Eosinophils Absolute 01/24/2016 0.1  0 - 0.7 K/uL Final  . Basophils Relative 01/24/2016 0   Final  . Basophils Absolute 01/24/2016 0.1  0 - 0.1 K/uL Final  . Sodium 01/24/2016 136  135 - 145 mmol/L Final  . Potassium 01/24/2016 3.6  3.5 - 5.1 mmol/L Final  . Chloride 01/24/2016 104  101 - 111 mmol/L Final  . CO2 01/24/2016 25  22 - 32 mmol/L Final  . Glucose, Bld 01/24/2016 171* 65 - 99 mg/dL Final  . BUN 01/24/2016 9  6 - 20 mg/dL Final  . Creatinine, Ser 01/24/2016 0.92  0.61 - 1.24 mg/dL Final  . Calcium 01/24/2016 8.6* 8.9 - 10.3 mg/dL Final  . Total Protein 01/24/2016 6.9  6.5 - 8.1 g/dL Final  . Albumin 01/24/2016 3.4* 3.5 - 5.0 g/dL Final  . AST 01/24/2016 12* 15 - 41 U/L Final  . ALT 01/24/2016 25  17 - 63 U/L Final  . Alkaline Phosphatase 01/24/2016 78  38 - 126 U/L Final  . Total Bilirubin 01/24/2016 0.6  0.3 - 1.2 mg/dL Final  . GFR calc non Af Amer 01/24/2016 >60  >60 mL/min Final  . GFR calc Af Amer 01/24/2016 >60  >60 mL/min Final   Comment: (NOTE) The eGFR has been calculated using the CKD EPI equation. This calculation has not been validated in all clinical situations. eGFR's persistently <60 mL/min signify possible Chronic Kidney Disease.   . Anion gap 01/24/2016 7  5 - 15 Final  Orders Only on 01/23/2016  Component Date Value Ref Range Status  . Prothrombin Time  01/23/2016 16.8* 11.6 - 15.2 seconds Final  . INR 01/23/2016 1.35  0.00 - 1.49 Final  Hospital Outpatient Visit on 01/23/2016  Component Date Value Ref Range Status  . Glucose-Capillary 01/23/2016 53* 65 - 99 mg/dL Final    Assessment:  Kenneth Curry is a 82 y.o. 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  He was diagnosed with CLL in 1994 or 1996. He has splenomegaly and chronic thrombocytopenia (100,000-120,000). He had 2 bone marrow biopsies (one at [facility] with Dr Otelia Limes). He has not required treatment. He has had no B symptoms, adenopathy, bruising or bleeding. He has had no recurrent infections.   He has a history of superficial bladder cancer in 05/2014 after presenting with hematuria. He underwent TURBT. He has been followed by Dr Elnoria Howard, urologist. His last cystoscopy on 10/23/2015 was negative.  He has a history of recurrent left DVT x 2-3. He was diagnosed with an apparent pulmonary embolism in 08/2009. "years ago". He was diagnosed with a left lower extremity DVT (popliteal thrombosis, non-occlusive) with a therapeutic INR (3.6) An IVC filter was placed. He was briefly on Lovenox, but then refused Lovenox, Arixtra, and Xarelto. He has been adamant to only receive Coumadin. He was on Coumadin 10 mg a day 5 days a week and 12.5 mg 2 days a week (total weekly dose 75 mg).  He has a history of pulmonary nodules in 2002. He notes a recent history of pancreatic problems. He presented with acute  abdominal pain . Abdominal and pelvic CT scan without contrast on 12/09/2015 revealed pancreatic ductal dilatation within the body and tail of the pancreas with pancreatic atrophy (new since 2010). There was a 3.2 x 2.7 cm cystic structure adjacent to the pancreatic head, as well as a 1 x 1.6 cm cystic structure/mass along the medial uncinate process.   Upper endoscopic ultrasound on  01/10/2016 revealed a gaping papilla with extruding mucin diagnostic of IPMN (intraductal papillary mucinous neoplasm).  A hypoechoic area in the pancreatic head was biopsied (FNA) and revealed  adenocarcinoma in a background of intraductal papillary mucinous neoplasm. There were a few benign lymph nodes in the porta hepatis which were not biopsied.  He was admitted to [facility] with acute pancreatitis from 01/17/2016 - 01/19/2016.  He was treated with pain medications, IVF and anti-emetics.  He was started on a clear liquid diet and then advanced to a low-fat diet.  Pain and nausea were not exacerbated.  Lipase remained normal.    PET scan on 01/23/2016 revealed focal hypermetabolism within the pancreatic head with cysts, ductal dilatation and gland atrophy in the remaining pancreas consistent with mixed pancreatic adenocarcinoma and intraductal papillary mucinous neoplasm.  There was mild hypermetabolism within a portacaval lymph node (SUV 9.3).   There was no distant metastatic disease.  There was persistent peripancreatic haziness, indicative of residual pancreatitis.   Regarding his anticoagulation, he was restarted on Lovenox 90 mq SQ q 12 hours 3 days after his procedure.  He was initially on 10 mg of Coumadin a day.   Coumadin was increased to 12.5 mg a day on 01/21/2016.  INR was 1.26 on 01/18/2016, 1.09 on 01/21/2016, and 1.35 on 01/23/2016.    Symptomatically, he continue to have upper abdominal pain.Marland Kitchen His nausea has improved.  He is no longer taking anti-emetics.  His oral intake is minimal.  He is drinking and voiding.  Blood sugar has been erratic.  He recently started taking his glipizide again.  Plan: 1.  Review events in past 2 weeks.  Discuss diagnosis of localized pancreatic cancer (clinical T2N1- stage IIB).  He is technically resectable, but given his age and co-morbidities, would be problematic.  Patient adamant about no surgery, chemotherapy, and possibly no radiation.  Discuss fortunate  discovery of pancreatic cancer (incidental).  Discuss consultation with radiation oncology for consideration of radiation.  As patient admanant about no chemotherapy, options are limited.  2.  Discuss Lovenox bridge.  Plan to continue Lovenox until INR therapeutic.  Concern for lack of oral intake and rapidly increasing INR.  Check INR today and tomorrow.  May need to check over weekend (02/[id number] or 01/20/2016). 3.  Discus diabetes and erratic/minimal diet.  Encouraged patient to follow-up with Hilbert Bible who is managing his diabetes. 4.  Discuss IVF if necessary through infusion center or home heatth.  Discuss possible admission to the hospital- patient declines. 5.  Labs today:  CBC with diff, CMP, amylase, lipase, PT/INR. 6.  Continue Lovenox and Caoumadin 12.5 mg a day. 7.  Consult radiation oncology- localized pancreatic cancer. 8.  RTC tomorrow for PT/INR.  No adjustment in Coumadin dose if INR increasing. 9.  RTC on 01/28/2016 in [place] for appt with Rachel Bo, NP and labs (PT/INR).   Lequita Asal, MD  01/24/2016, 4:20 PM

## 2016-01-24 NOTE — Telephone Encounter (Signed)
Saw patient today in the office and Dr. Mike Gip informed patient of dosages.

## 2016-01-25 ENCOUNTER — Other Ambulatory Visit: Payer: Commercial Managed Care - HMO

## 2016-01-25 ENCOUNTER — Inpatient Hospital Stay: Payer: Commercial Managed Care - HMO

## 2016-01-25 ENCOUNTER — Telehealth: Payer: Self-pay | Admitting: *Deleted

## 2016-01-25 DIAGNOSIS — R319 Hematuria, unspecified: Secondary | ICD-10-CM | POA: Diagnosis not present

## 2016-01-25 DIAGNOSIS — Z7901 Long term (current) use of anticoagulants: Secondary | ICD-10-CM

## 2016-01-25 DIAGNOSIS — R109 Unspecified abdominal pain: Secondary | ICD-10-CM | POA: Diagnosis not present

## 2016-01-25 DIAGNOSIS — C259 Malignant neoplasm of pancreas, unspecified: Secondary | ICD-10-CM | POA: Diagnosis not present

## 2016-01-25 DIAGNOSIS — C9110 Chronic lymphocytic leukemia of B-cell type not having achieved remission: Secondary | ICD-10-CM | POA: Diagnosis not present

## 2016-01-25 DIAGNOSIS — E7800 Pure hypercholesterolemia, unspecified: Secondary | ICD-10-CM | POA: Diagnosis not present

## 2016-01-25 DIAGNOSIS — E119 Type 2 diabetes mellitus without complications: Secondary | ICD-10-CM | POA: Diagnosis not present

## 2016-01-25 DIAGNOSIS — R918 Other nonspecific abnormal finding of lung field: Secondary | ICD-10-CM | POA: Diagnosis not present

## 2016-01-25 DIAGNOSIS — D696 Thrombocytopenia, unspecified: Secondary | ICD-10-CM | POA: Diagnosis not present

## 2016-01-25 DIAGNOSIS — K859 Acute pancreatitis without necrosis or infection, unspecified: Secondary | ICD-10-CM | POA: Diagnosis not present

## 2016-01-25 LAB — PROTIME-INR
INR: 1.94 — ABNORMAL HIGH (ref 0.00–1.49)
Prothrombin Time: 22.1 seconds — ABNORMAL HIGH (ref 11.6–15.2)

## 2016-01-25 NOTE — Telephone Encounter (Signed)
Per Dr Mike Gip, she will call wife in a couple of hours to discuss dose change today, He is to take his last dose of Lovenox tonight then stop. He is to come to [facility] outpatient lab tomorrow for PT/ INR recheck and check in through ER for outpt lab then await a call from Dr Rogue Bussing with any dose changes. Then he is to return to clinic on Monday for lab and MD. I relayed all this information to wife who had me repeat it to her several times and then she repeated it back to me correctly.

## 2016-01-25 NOTE — Telephone Encounter (Signed)
INR 1.94 today, per Dr Rogue Bussing, continue same dose of Warfarin. Pricilla Holm informed of this then asked if he can stop the Lovenox injections. Dr B in with new patient and informed her we will have to call her back with that answer. She was fine with being called back

## 2016-01-28 ENCOUNTER — Telehealth: Payer: Self-pay | Admitting: Hematology and Oncology

## 2016-01-28 ENCOUNTER — Other Ambulatory Visit: Payer: Self-pay | Admitting: Hematology and Oncology

## 2016-01-28 ENCOUNTER — Inpatient Hospital Stay: Payer: Commercial Managed Care - HMO

## 2016-01-28 ENCOUNTER — Encounter: Payer: Self-pay | Admitting: Family Medicine

## 2016-01-28 ENCOUNTER — Inpatient Hospital Stay (HOSPITAL_BASED_OUTPATIENT_CLINIC_OR_DEPARTMENT_OTHER): Payer: Commercial Managed Care - HMO | Admitting: Family Medicine

## 2016-01-28 VITALS — BP 133/77 | HR 50 | Temp 97.0°F | Wt 203.3 lb

## 2016-01-28 DIAGNOSIS — D696 Thrombocytopenia, unspecified: Secondary | ICD-10-CM | POA: Diagnosis not present

## 2016-01-28 DIAGNOSIS — E7800 Pure hypercholesterolemia, unspecified: Secondary | ICD-10-CM | POA: Diagnosis not present

## 2016-01-28 DIAGNOSIS — R918 Other nonspecific abnormal finding of lung field: Secondary | ICD-10-CM

## 2016-01-28 DIAGNOSIS — Z8719 Personal history of other diseases of the digestive system: Secondary | ICD-10-CM | POA: Diagnosis not present

## 2016-01-28 DIAGNOSIS — F17210 Nicotine dependence, cigarettes, uncomplicated: Secondary | ICD-10-CM

## 2016-01-28 DIAGNOSIS — R109 Unspecified abdominal pain: Secondary | ICD-10-CM | POA: Diagnosis not present

## 2016-01-28 DIAGNOSIS — Z8551 Personal history of malignant neoplasm of bladder: Secondary | ICD-10-CM

## 2016-01-28 DIAGNOSIS — N471 Phimosis: Secondary | ICD-10-CM

## 2016-01-28 DIAGNOSIS — E119 Type 2 diabetes mellitus without complications: Secondary | ICD-10-CM | POA: Diagnosis not present

## 2016-01-28 DIAGNOSIS — Z7901 Long term (current) use of anticoagulants: Secondary | ICD-10-CM

## 2016-01-28 DIAGNOSIS — Z794 Long term (current) use of insulin: Secondary | ICD-10-CM | POA: Diagnosis not present

## 2016-01-28 DIAGNOSIS — R319 Hematuria, unspecified: Secondary | ICD-10-CM | POA: Diagnosis not present

## 2016-01-28 DIAGNOSIS — C259 Malignant neoplasm of pancreas, unspecified: Secondary | ICD-10-CM | POA: Diagnosis not present

## 2016-01-28 DIAGNOSIS — C9110 Chronic lymphocytic leukemia of B-cell type not having achieved remission: Secondary | ICD-10-CM | POA: Diagnosis not present

## 2016-01-28 DIAGNOSIS — R161 Splenomegaly, not elsewhere classified: Secondary | ICD-10-CM

## 2016-01-28 DIAGNOSIS — Z86718 Personal history of other venous thrombosis and embolism: Secondary | ICD-10-CM

## 2016-01-28 DIAGNOSIS — K859 Acute pancreatitis without necrosis or infection, unspecified: Secondary | ICD-10-CM

## 2016-01-28 LAB — PROTIME-INR
INR: 1.93 — ABNORMAL HIGH (ref 0.00–1.49)
Prothrombin Time: 22 seconds — ABNORMAL HIGH (ref 11.6–15.2)

## 2016-01-28 MED ORDER — ONDANSETRON HCL 4 MG PO TABS: 4.0000 mg | ORAL_TABLET | Freq: Three times a day (TID) | ORAL | Status: DC | PRN

## 2016-01-28 MED ORDER — OMEPRAZOLE 20 MG PO CPDR
20.0000 mg | DELAYED_RELEASE_CAPSULE | Freq: Two times a day (BID) | ORAL | Status: AC
Start: 2016-01-28 — End: ?

## 2016-01-28 NOTE — Telephone Encounter (Signed)
Re:  Anticoagulation  Spoke to patient's wife about PT/INR.  Patient to take 10 mg Coumadin on 12/25/2015.    In the past, he was taking Coumadiin 10 mg a day 5 days a week and 12.5 mg 2 days a week (spread out).  He had been increased to 12.5 mg a day on 01/21/2016.  Given his poor oral intake and recent increases in INR, he was instructed to have an INR checked on 01/26/2016.  I called him on 01/27/2016, as no INR was drawn.  He said he wasn't getting his blood drawn.  I discussed my concern about his INR given it's recent rise and poor oral intake.  He adamantly said he was not coming in to have it checked.    He has a lab appointment for an INR on Monday, 01/28/2016.  Lequita Asal, MD

## 2016-01-28 NOTE — Progress Notes (Signed)
Add on visit to assess patient how patient is doing. He is taking 10 mg coumadin daily. He has intermittent Right sided abd pain that is mild with some nausea that is also mild. Pt doesn't take his pain pills because the pain is not bad. He isnt taking the nausea medication either. I did instruct him to start taking phenergan at the first inclination of nausea.

## 2016-01-28 NOTE — Progress Notes (Signed)
[place]  [phone]  [phone]     Kenneth Curry DOB: 29-May-1933  MR#: [id number]  [id number]  Patient Care Team: Ezequiel Kayser, MD as PCP - General (Internal Medicine) Clent Jacks, RN as Registered Nurse  CHIEF COMPLAINT:  Chief Complaint  Patient presents with  . Follow-up    Pancreatic Cancer, CLL,anticoag therapy    INTERVAL HISTORY:  Kenneth Curry is a 82 y.o. male with CLL, a history of bladder tumor, and left lower extremity DVT who is seen for reassessment following upper endoscopic ultrasound with biopsy documenting pancreatic adenocarcinoma in a background of IPMN and hospitalization for pancreatitis. PET scan on 01/23/2016 revealed focal hypermetabolism within the pancreatic head with cysts, ductal dilatation and gland atrophy in the remaining pancreas. 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 There was mild hypermetabolism within a portacaval lymph node, indicating involvement.There was no distant metastatic disease.There was persistent peripancreatic haziness, indicative of residual pancreatitis.There was a small pelvic free fluid. He is also on coumadin following a lovenox bridge during EUS for biopsy. He is currently off of the injections and is taking Coumadin 10mg  daily.   Symptomatically, he reports his input has increased, he is having 3-5 small meals daily and is tolerating them well. He reports pain and nausea is sudden and does not last long enough for pain medication or antiemetics. He is not using his omeprazole regularly. He has been having normal bowel movements and voiding regularly. Blood sugar continues to be under fair control, he is seeing his PCP today for adjustments.  REVIEW OF SYSTEMS:   Review of Systems  Constitutional: Negative for fever, chills, weight loss, malaise/fatigue and diaphoresis.  HENT:  Negative.   Eyes: Negative.   Respiratory: Negative for cough, hemoptysis, sputum production, shortness of breath and wheezing.   Cardiovascular: Negative for chest pain, palpitations, orthopnea, claudication, leg swelling and PND.  Gastrointestinal: Positive for nausea and abdominal pain. Negative for heartburn, vomiting, diarrhea, constipation, blood in stool and melena.       Fullness in right upper quadrant. Nausea and pain come and pass so quickly he is unable to take medications for them.  Genitourinary: Negative.   Musculoskeletal: Negative.   Skin: Negative.   Neurological: Negative for dizziness, tingling, focal weakness, seizures and weakness.  Endo/Heme/Allergies: Does not bruise/bleed easily.  Psychiatric/Behavioral: Negative for depression. The patient is not nervous/anxious and does not have insomnia.     As per HPI. Otherwise, a complete review of systems is negatve.  ONCOLOGY HISTORY: Oncology History   Stage IIB, T2 N1 M0     Pancreatic adenocarcinoma ([place])   01/16/2016 Initial Diagnosis Pancreatic adenocarcinoma [facility])    PAST MEDICAL HISTORY: Past Medical History  Diagnosis Date  . Abdominal aortic aneurysm ([place])   . Pulmonary nodules   . Thrombocytopenia ([place])   . Presence of IVC filter   . Elevated cholesterol   . Diabetes mellitus, type 2 ([place])   . Cellulitis   . History of primary bladder cancer   . Hematuria   . Urinary frequency   . Phimosis   . Pancreatic adenocarcinoma ([place]) 01/16/2016    PAST SURGICAL HISTORY: Past Surgical History  Procedure Laterality Date  . Cholecystectomy    . Cochlear implant    . Appendectomy    . Abdominal aortic aneurysm repair    . Upper esophageal endoscopic ultrasound (eus) N/A 01/10/2016    Procedure: UPPER ESOPHAGEAL ENDOSCOPIC  ULTRASOUND (EUS);  Surgeon: Cora Daniels, MD;  Location: [facility] ENDOSCOPY;  Service: Endoscopy;  Laterality: N/A;    FAMILY HISTORY Family History  Problem Relation Age of Onset    . Lung cancer Brother   . Colon cancer Brother   . Cancer Sister     Renal  . Cancer Mother 38    Kidney  . Cancer Paternal Uncle     melanoma    GYNECOLOGIC HISTORY:  No LMP for male patient.     ADVANCED DIRECTIVES:    HEALTH MAINTENANCE: Social History  Substance Use Topics  . Smoking status: Former Smoker -- 1.00 packs/day    Types: Cigarettes    Quit date: 12/28/2015  . Smokeless tobacco: Not on file  . Alcohol Use: No     Allergies  Allergen Reactions  . Hydrocodone-Acetaminophen Other (See Comments)    Other reaction(s): Other (See Comments) intolerate intolerate  . Metformin Diarrhea  . Atorvastatin Rash    Current Outpatient Prescriptions  Medication Sig Dispense Refill  . Acetaminophen 500 MG coapsule     . Calcium Carbonate-Vitamin D (CALCIUM-D PO) Take 1 tablet by mouth daily.     Marland Kitchen glimepiride (AMARYL) 4 MG tablet     . LANTUS SOLOSTAR 100 UNIT/ML Solostar Pen INJECT 24 UNITS SUBCUTANEOUSLY ONCE DAILY INTO ABDOMEN,THIGHS,OUTER ARMS EVERY NIGHT. ROTATE SITES  3  . levothyroxine (SYNTHROID, LEVOTHROID) 150 MCG tablet Take 150 mcg by mouth daily before breakfast.     . lovastatin (MEVACOR) 20 MG tablet Take 20 mg by mouth at bedtime.     Marland Kitchen NASAL DECONGESTANT SPRAY 0.05 % nasal spray Place 1 spray into both nostrils 2 (two) times daily as needed for congestion.     Marland Kitchen OMEPRAZOLE PO Take 20 mg by mouth daily.    Marland Kitchen warfarin (COUMADIN) 5 MG tablet 12.5mg  every day (Patient taking differently: 10 mg one time only at 6 PM. 12.5mg  every day) 30 tablet 0  . ondansetron (ZOFRAN) 4 MG tablet Take 1 tablet (4 mg total) by mouth every 8 (eight) hours as needed for nausea or vomiting. 30 tablet 1  . oxyCODONE (OXY IR/ROXICODONE) 5 MG immediate release tablet Take 1 tablet (5 mg total) by mouth every 4 (four) hours as needed for severe pain. (Patient not taking: Reported on 01/28/2016) 30 tablet 0  . promethazine (PHENERGAN) 25 MG tablet Take 25 mg by mouth every 6 (six)  hours as needed. Reported on 01/28/2016     No current facility-administered medications for this visit.    OBJECTIVE: BP 133/77 mmHg  Pulse 50  Temp(Src) 97 F (36.1 C) (Oral)  Wt 203 lb 4.2 oz (92.2 kg)  SpO2 99%   Body mass index is 30.91 kg/(m^2).    ECOG FS:1 - Symptomatic but completely ambulatory  General: Well-developed, well-nourished, no acute distress. Eyes: Pink conjunctiva, anicteric sclera. HEENT: Normocephalic, moist mucous membranes, clear oropharnyx. Lungs: Clear to auscultation bilaterally. Heart: Regular rate and rhythm. No rubs, murmurs, or gallops. Abdomen: Soft, nontender, nondistended. No organomegaly noted, normoactive bowel sounds. Musculoskeletal: No edema, cyanosis, or clubbing. Neuro: Alert, answering all questions appropriately. Cranial nerves grossly intact. Skin: No rashes or petechiae noted. Psych: Normal affect.    LAB RESULTS:  No visits with results within 3 Day(s) from this visit. Latest known visit with results is:  Appointment on 01/25/2016  Component Date Value Ref Range Status  . Prothrombin Time 01/25/2016 22.1* 11.6 - 15.2 seconds Final  . INR 01/25/2016 1.94* 0.00 - 1.49 Final  ASSESSMENT:  Pancreatic adenocarcinoma. Long term DVT anticoagulation.  Hx of CLL.  PLAN:   1. Pancreatic cancer. 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 There was persistent peripancreatic haziness, indicative of residual pancreatitis. Since discharge from the hospital on 01/19/2016 with acute pancreatitis he has continued to improve. Appetite is increasing, nausea and pain are fleeting. He does have RUQ fullness. Advised to start taking  his omeprazole twice daily. Also sent prescription for ondansetron for nonsedating nausea management.  2. DVT/ coumadin management. History of LLE DVT x 2-3. Also with an apparent PE in 2010. Currently on 10mg  Coumadin per day. Was recently discontinued from Lovenox following Lovenox bridging for EUS and biopsy. PT/INR today was 1.93. Advised patient to continue with 10mg  of coumadin daily, educated on proper dietary restrictions. We will recheck his next lab in 1 week on 02/04/16.   Mr. Dasilva is still undecided as to the course of treatment he would like to pursue. I have discussed at length the options for treatment including: surgical resection, which I believe he would be a poor candidate for and patient has adamantly refused, radiation therapy, and chemotherapy. Patient is scheduled to see Dr. Baruch Gouty, Radiation Oncologist, on March 6th for consultation regarding Radiation therapy.  Patient has an appointment with Dr. Drucilla Chalet at [facility] on March 1st at 2pm for a second opinion.   He is requesting at this time to pursue care with a different Heme/ Oncologist at [facility]. Discussed that an appointment would be made following his appointment with Dr. Truman Hayward at [facility]. Will schedule him to see Dr. Grayland Ormond on March 10th.   Until next follow up patient will require continued monitoring of PT/ INR. I will continue to monitor and adjust accordingly.   Patient expressed understanding and was in agreement with this plan. He also understands that He can call clinic at any time with any questions, concerns, or complaints.   Dr. Grayland Ormond was available for consultation and review of plan of care for this patient.  Pancreatic adenocarcinoma [facility])   Staging form: Pancreas, AJCC 7th Edition     Clinical stage from 01/10/2016: Stage IIB (T2, N1, M0) - Signed by Evlyn Kanner, NP on 01/28/2016   Evlyn Kanner, NP   01/28/2016 10:24 AM

## 2016-02-04 ENCOUNTER — Inpatient Hospital Stay: Payer: Commercial Managed Care - HMO

## 2016-02-04 ENCOUNTER — Other Ambulatory Visit: Payer: Self-pay | Admitting: Family Medicine

## 2016-02-04 DIAGNOSIS — C9110 Chronic lymphocytic leukemia of B-cell type not having achieved remission: Secondary | ICD-10-CM | POA: Diagnosis not present

## 2016-02-04 DIAGNOSIS — E7800 Pure hypercholesterolemia, unspecified: Secondary | ICD-10-CM | POA: Diagnosis not present

## 2016-02-04 DIAGNOSIS — R109 Unspecified abdominal pain: Secondary | ICD-10-CM | POA: Diagnosis not present

## 2016-02-04 DIAGNOSIS — K859 Acute pancreatitis without necrosis or infection, unspecified: Secondary | ICD-10-CM | POA: Diagnosis not present

## 2016-02-04 DIAGNOSIS — R319 Hematuria, unspecified: Secondary | ICD-10-CM | POA: Diagnosis not present

## 2016-02-04 DIAGNOSIS — Z7901 Long term (current) use of anticoagulants: Secondary | ICD-10-CM

## 2016-02-04 DIAGNOSIS — E119 Type 2 diabetes mellitus without complications: Secondary | ICD-10-CM | POA: Diagnosis not present

## 2016-02-04 DIAGNOSIS — C259 Malignant neoplasm of pancreas, unspecified: Secondary | ICD-10-CM

## 2016-02-04 DIAGNOSIS — R918 Other nonspecific abnormal finding of lung field: Secondary | ICD-10-CM | POA: Diagnosis not present

## 2016-02-04 DIAGNOSIS — D696 Thrombocytopenia, unspecified: Secondary | ICD-10-CM | POA: Diagnosis not present

## 2016-02-04 LAB — PROTIME-INR
INR: 2.09
Prothrombin Time: 23.3 seconds — ABNORMAL HIGH (ref 11.4–15.0)

## 2016-02-06 DIAGNOSIS — C259 Malignant neoplasm of pancreas, unspecified: Secondary | ICD-10-CM | POA: Diagnosis not present

## 2016-02-11 ENCOUNTER — Institutional Professional Consult (permissible substitution): Payer: Commercial Managed Care - HMO | Admitting: Radiation Oncology

## 2016-02-11 ENCOUNTER — Inpatient Hospital Stay: Payer: Commercial Managed Care - HMO | Attending: Hematology and Oncology

## 2016-02-14 DIAGNOSIS — C259 Malignant neoplasm of pancreas, unspecified: Secondary | ICD-10-CM | POA: Diagnosis not present

## 2016-02-14 DIAGNOSIS — K8689 Other specified diseases of pancreas: Secondary | ICD-10-CM | POA: Diagnosis not present

## 2016-02-15 ENCOUNTER — Inpatient Hospital Stay (HOSPITAL_BASED_OUTPATIENT_CLINIC_OR_DEPARTMENT_OTHER): Payer: Commercial Managed Care - HMO | Admitting: Oncology

## 2016-02-15 ENCOUNTER — Inpatient Hospital Stay: Payer: Commercial Managed Care - HMO | Attending: Hematology and Oncology

## 2016-02-15 VITALS — BP 138/66 | HR 61 | Temp 97.8°F | Ht 68.0 in | Wt 201.3 lb

## 2016-02-15 DIAGNOSIS — Z8551 Personal history of malignant neoplasm of bladder: Secondary | ICD-10-CM | POA: Insufficient documentation

## 2016-02-15 DIAGNOSIS — R918 Other nonspecific abnormal finding of lung field: Secondary | ICD-10-CM

## 2016-02-15 DIAGNOSIS — E7800 Pure hypercholesterolemia, unspecified: Secondary | ICD-10-CM | POA: Diagnosis not present

## 2016-02-15 DIAGNOSIS — C259 Malignant neoplasm of pancreas, unspecified: Secondary | ICD-10-CM

## 2016-02-15 DIAGNOSIS — D696 Thrombocytopenia, unspecified: Secondary | ICD-10-CM | POA: Diagnosis not present

## 2016-02-15 DIAGNOSIS — Z86718 Personal history of other venous thrombosis and embolism: Secondary | ICD-10-CM | POA: Insufficient documentation

## 2016-02-15 DIAGNOSIS — Z808 Family history of malignant neoplasm of other organs or systems: Secondary | ICD-10-CM | POA: Diagnosis not present

## 2016-02-15 DIAGNOSIS — E119 Type 2 diabetes mellitus without complications: Secondary | ICD-10-CM | POA: Insufficient documentation

## 2016-02-15 DIAGNOSIS — Z7901 Long term (current) use of anticoagulants: Secondary | ICD-10-CM | POA: Diagnosis not present

## 2016-02-15 DIAGNOSIS — Z87891 Personal history of nicotine dependence: Secondary | ICD-10-CM | POA: Insufficient documentation

## 2016-02-15 DIAGNOSIS — Z79899 Other long term (current) drug therapy: Secondary | ICD-10-CM

## 2016-02-15 DIAGNOSIS — Z794 Long term (current) use of insulin: Secondary | ICD-10-CM | POA: Diagnosis not present

## 2016-02-15 DIAGNOSIS — C9110 Chronic lymphocytic leukemia of B-cell type not having achieved remission: Secondary | ICD-10-CM | POA: Insufficient documentation

## 2016-02-15 DIAGNOSIS — N471 Phimosis: Secondary | ICD-10-CM | POA: Diagnosis not present

## 2016-02-15 DIAGNOSIS — Z86711 Personal history of pulmonary embolism: Secondary | ICD-10-CM | POA: Diagnosis not present

## 2016-02-15 LAB — COMPREHENSIVE METABOLIC PANEL
ALK PHOS: 68 U/L (ref 38–126)
ALT: 11 U/L — AB (ref 17–63)
AST: 13 U/L — ABNORMAL LOW (ref 15–41)
Albumin: 3.9 g/dL (ref 3.5–5.0)
Anion gap: 5 (ref 5–15)
BILIRUBIN TOTAL: 0.9 mg/dL (ref 0.3–1.2)
BUN: 17 mg/dL (ref 6–20)
CO2: 29 mmol/L (ref 22–32)
CREATININE: 1.07 mg/dL (ref 0.61–1.24)
Calcium: 9 mg/dL (ref 8.9–10.3)
Chloride: 100 mmol/L — ABNORMAL LOW (ref 101–111)
GFR calc Af Amer: >60 mL/min (ref >60)
Glucose, Bld: 337 mg/dL — ABNORMAL HIGH (ref 65–99)
Potassium: 5 mmol/L (ref 3.5–5.1)
SODIUM: 134 mmol/L — AB (ref 135–145)
TOTAL PROTEIN: 7.3 g/dL (ref 6.5–8.1)

## 2016-02-15 LAB — CBC WITH DIFFERENTIAL/PLATELET
BASOS ABS: 0 10*3/uL (ref 0–0.1)
Basophils Relative: 0 %
EOS ABS: 0.2 10*3/uL (ref 0–0.7)
HCT: 41.7 % (ref 40.0–52.0)
Hemoglobin: 13.7 g/dL (ref 13.0–18.0)
Lymphocytes Relative: 52 %
Lymphs Abs: 6.2 10*3/uL — ABNORMAL HIGH (ref 1.0–3.6)
MCH: 26.8 pg (ref 26.0–34.0)
MCHC: 32.8 g/dL (ref 32.0–36.0)
MCV: 81.6 fL (ref 80.0–100.0)
MONO ABS: 0.5 10*3/uL (ref 0.2–1.0)
Monocytes Relative: 4 %
Neutro Abs: 4.9 10*3/uL (ref 1.4–6.5)
Neutrophils Relative %: 42 %
PLATELETS: 85 10*3/uL — AB (ref 150–440)
RBC: 5.11 MIL/uL (ref 4.40–5.90)
RDW: 15.8 % — AB (ref 11.5–14.5)
WBC: 11.8 10*3/uL — AB (ref 3.8–10.6)

## 2016-02-15 LAB — PROTIME-INR
INR: 2.24
PROTHROMBIN TIME: 24.6 s — AB (ref 11.4–15.0)

## 2016-02-15 NOTE — Progress Notes (Signed)
Patient is here because he wants to change providers, would like Dr. Grayland Ormond

## 2016-02-21 DIAGNOSIS — E119 Type 2 diabetes mellitus without complications: Secondary | ICD-10-CM | POA: Diagnosis not present

## 2016-02-21 DIAGNOSIS — C259 Malignant neoplasm of pancreas, unspecified: Secondary | ICD-10-CM | POA: Diagnosis not present

## 2016-02-21 DIAGNOSIS — Z794 Long term (current) use of insulin: Secondary | ICD-10-CM | POA: Diagnosis not present

## 2016-02-23 NOTE — Progress Notes (Signed)
[place]  Telephone:(336) [phone]  Fax:(336) [phone]     BURNETT SPRAY DOB: 05/18/1933  MR#: [id number]  KVQ#:[id number]  Patient Care Team: Ezequiel Kayser, MD as PCP - General (Internal Medicine) Clent Jacks, RN as Registered Nurse  CHIEF COMPLAINT:  Chief Complaint  Patient presents with  . pancreatic adenocarcinoma    INTERVAL HISTORY:   Kenneth Curry is a 82 y.o. male with CLL, a history of bladder tumor, and left lower extremity DVT who Has been recently diagnosed with pancreatic cancer. PET scan did not reveal distant metastatic disease. Patient is in clinic today to discuss treatment options. He currently feels well and is asymptomatic. He has no neurologic complaints. He has pain, but is well-controlled with his current medications. He denies any recent fevers. He has no chest pain or shortness of breath. He denies any nausea, vomiting, constipation, or diarrhea. He has no urinary complaints. Patient otherwise feels well and offers no further specific complaints.   REVIEW OF SYSTEMS:   Review of Systems  Constitutional: Negative.  Negative for fever, weight loss and malaise/fatigue.  Respiratory: Negative.   Cardiovascular: Negative.  Negative for chest pain.  Gastrointestinal: Positive for abdominal pain. Negative for nausea.  Genitourinary: Negative.   Musculoskeletal: Negative.   Neurological: Negative.  Negative for weakness.    As per HPI. Otherwise, a complete review of systems is negatve.  ONCOLOGY HISTORY: Oncology History   Stage IIB, T2 N1 M0     Pancreatic adenocarcinoma ([place])   01/16/2016 Initial Diagnosis Pancreatic adenocarcinoma [facility])    PAST MEDICAL HISTORY: Past Medical History  Diagnosis Date  . Abdominal aortic aneurysm ([place])   . Pulmonary nodules   . Thrombocytopenia ([place])   . Presence of IVC filter   . Elevated cholesterol   . Diabetes mellitus, type 2 ([place])   . Cellulitis   . History of primary bladder cancer   .  Hematuria   . Urinary frequency   . Phimosis   . Pancreatic adenocarcinoma ([place]) 01/16/2016    PAST SURGICAL HISTORY: Past Surgical History  Procedure Laterality Date  . Cholecystectomy    . Cochlear implant    . Appendectomy    . Abdominal aortic aneurysm repair    . Upper esophageal endoscopic ultrasound (eus) N/A 01/10/2016    Procedure: UPPER ESOPHAGEAL ENDOSCOPIC ULTRASOUND (EUS);  Surgeon: Cora Daniels, MD;  Location: [facility] ENDOSCOPY;  Service: Endoscopy;  Laterality: N/A;    FAMILY HISTORY Family History  Problem Relation Age of Onset  . Lung cancer Brother   . Colon cancer Brother   . Cancer Sister     Renal  . Cancer Mother 37    Kidney  . Cancer Paternal Uncle     melanoma    GYNECOLOGIC HISTORY:  No LMP for male patient.     ADVANCED DIRECTIVES:    HEALTH MAINTENANCE: Social History  Substance Use Topics  . Smoking status: Former Smoker -- 1.00 packs/day    Types: Cigarettes    Quit date: 12/28/2015  . Smokeless tobacco: Not on file  . Alcohol Use: No     Allergies  Allergen Reactions  . Hydrocodone-Acetaminophen Other (See Comments)    Other reaction(s): Other (See Comments) intolerate intolerate  . Metformin Diarrhea  . Atorvastatin Rash    Current Outpatient Prescriptions  Medication Sig Dispense Refill  . warfarin (COUMADIN) 10 MG tablet Take 10 mg by mouth daily.    . Acetaminophen 500 MG coapsule     .  Calcium Carbonate-Vitamin D (CALCIUM-D PO) Take 1 tablet by mouth daily.     Marland Kitchen glimepiride (AMARYL) 4 MG tablet     . LANTUS SOLOSTAR 100 UNIT/ML Solostar Pen INJECT 24 UNITS SUBCUTANEOUSLY ONCE DAILY INTO ABDOMEN,THIGHS,OUTER ARMS EVERY NIGHT. ROTATE SITES  3  . levothyroxine (SYNTHROID, LEVOTHROID) 150 MCG tablet Take 150 mcg by mouth daily before breakfast.     . lovastatin (MEVACOR) 20 MG tablet Take 20 mg by mouth at bedtime.     Marland Kitchen NASAL DECONGESTANT SPRAY 0.05 % nasal spray Place 1 spray into both nostrils 2 (two) times daily  as needed for congestion.     Marland Kitchen omeprazole (PRILOSEC) 20 MG capsule Take 1 capsule (20 mg total) by mouth 2 (two) times daily before a meal. 30 capsule 5  . ondansetron (ZOFRAN) 4 MG tablet Take 1 tablet (4 mg total) by mouth every 8 (eight) hours as needed for nausea or vomiting. 30 tablet 1  . oxyCODONE (OXY IR/ROXICODONE) 5 MG immediate release tablet Take 1 tablet (5 mg total) by mouth every 4 (four) hours as needed for severe pain. (Patient not taking: Reported on 01/28/2016) 30 tablet 0  . promethazine (PHENERGAN) 25 MG tablet Take 25 mg by mouth every 6 (six) hours as needed. Reported on 01/28/2016     No current facility-administered medications for this visit.    OBJECTIVE: BP 138/66 mmHg  Pulse 61  Temp(Src) 97.8 F (36.6 C) (Oral)  Ht _0  (1.727 m)  Wt 201 lb 4.5 oz (91.3 kg)  BMI 30.61 kg/m2  SpO2 100%   Body mass index is 30.61 kg/(m^2).    ECOG FS:1 - Symptomatic but completely ambulatory  General: Well-developed, well-nourished, no acute distress. Eyes: Pink conjunctiva, anicteric sclera. HEENT: Normocephalic, moist mucous membranes, clear oropharnyx. Lungs: Clear to auscultation bilaterally. Heart: Regular rate and rhythm. No rubs, murmurs, or gallops. Abdomen: Soft, nontender, nondistended. No organomegaly noted, normoactive bowel sounds. Musculoskeletal: No edema, cyanosis, or clubbing. Neuro: Alert, answering all questions appropriately. Cranial nerves grossly intact. Skin: No rashes or petechiae noted. Psych: Normal affect.    LAB RESULTS:  Appointment on 02/15/2016  Component Date Value Ref Range Status  . WBC 02/15/2016 11.8* 3.8 - 10.6 K/uL Final  . RBC 02/15/2016 5.11  4.40 - 5.90 MIL/uL Final  . Hemoglobin 02/15/2016 13.7  13.0 - 18.0 g/dL Final  . HCT 02/15/2016 41.7  40.0 - 52.0 % Final  . MCV 02/15/2016 81.6  80.0 - 100.0 fL Final  . MCH 02/15/2016 26.8  26.0 - 34.0 pg Final  . MCHC 02/15/2016 32.8  32.0 - 36.0 g/dL Final  . RDW 02/15/2016 15.8*  11.5 - 14.5 % Final  . Platelets 02/15/2016 85* 150 - 440 K/uL Final  . Neutrophils Relative % 02/15/2016 42%   Final  . Neutro Abs 02/15/2016 4.9  1.4 - 6.5 K/uL Final  . Lymphocytes Relative 02/15/2016 52%   Final  . Lymphs Abs 02/15/2016 6.2* 1.0 - 3.6 K/uL Final  . Monocytes Relative 02/15/2016 4%   Final  . Monocytes Absolute 02/15/2016 0.5  0.2 - 1.0 K/uL Final  . Eosinophils Relative 02/15/2016 2%   Final  . Eosinophils Absolute 02/15/2016 0.2  0 - 0.7 K/uL Final  . Basophils Relative 02/15/2016 0%   Final  . Basophils Absolute 02/15/2016 0.0  0 - 0.1 K/uL Final  . Sodium 02/15/2016 134* 135 - 145 mmol/L Final  . Potassium 02/15/2016 5.0  3.5 - 5.1 mmol/L Final  . Chloride 02/15/2016 100*  101 - 111 mmol/L Final  . CO2 02/15/2016 29  22 - 32 mmol/L Final  . Glucose, Bld 02/15/2016 337* 65 - 99 mg/dL Final  . BUN 02/15/2016 17  6 - 20 mg/dL Final  . Creatinine, Ser 02/15/2016 1.07  0.61 - 1.24 mg/dL Final  . Calcium 02/15/2016 9.0  8.9 - 10.3 mg/dL Final  . Total Protein 02/15/2016 7.3  6.5 - 8.1 g/dL Final  . Albumin 02/15/2016 3.9  3.5 - 5.0 g/dL Final  . AST 02/15/2016 13* 15 - 41 U/L Final  . ALT 02/15/2016 11* 17 - 63 U/L Final  . Alkaline Phosphatase 02/15/2016 68  38 - 126 U/L Final  . Total Bilirubin 02/15/2016 0.9  0.3 - 1.2 mg/dL Final  . GFR calc non Af Amer 02/15/2016 >60  >60 mL/min Final  . GFR calc Af Amer 02/15/2016 >60  >60 mL/min Final   Comment: (NOTE) The eGFR has been calculated using the CKD EPI equation. This calculation has not been validated in all clinical situations. eGFR's persistently <60 mL/min signify possible Chronic Kidney Disease.   . Anion gap 02/15/2016 5  5 - 15 Final  . Prothrombin Time 02/15/2016 24.6* 11.4 - 15.0 seconds Final  . INR 02/15/2016 2.24   Final   Lab Results  Component Value Date   CA199 287* 01/16/2016     ASSESSMENT: Stage IIa adenocarcinoma the pancreas, history of DVT and CLL.  PLAN:    1. Pancreatic cancer:  PET scan results reviewed independently with likely local advancement of disease and a portacaval lymph node but without obvious metastatic disease. After lengthy discussion with patient and his family, they agree that he is a poor surgical candidate and this would not be a good option to pursue. He does not wish to pursue radiation therapy. Chemotherapy was discussed at length including oral Tarceva, but patient states since he is currently essentially asymptomatic he does not wish to pursue any treatment at this time. He expressed understanding that his cancer will likely advance and possibly become metastatic. He said he would reconsider chemotherapy in the future if he were symptomatic. We also briefly discussed the option of comfort care and hospice in the future if necessary. No intervention is needed at this time. Return to clinic in 6 weeks with repeat laboratory work and further evaluation.  2. DVT/coumadin managemen: Patient has a history of left lower extremity DVT as well as a PE in 2010. He is currently on 10 mg Coumadin daily lifelong. His INR today is 2.24. Goal INR is between 2 and 3. 3. CLL: White blood cell count is within normal limits, monitor.  Approximately 30 minutes was spent in discussion of which greater than 50% was consultation.  Patient expressed understanding and was in agreement with this plan. He also understands that He can call clinic at any time with any questions, concerns, or complaints.    Pancreatic adenocarcinoma [facility])   Staging form: Pancreas, AJCC 7th Edition     Clinical stage from 01/10/2016: Stage IIB (T2, N1, M0) - Signed by Evlyn Kanner, NP on 01/28/2016   Lloyd Huger, MD   02/23/2016 11:50 AM

## 2016-03-11 ENCOUNTER — Telehealth: Payer: Self-pay | Admitting: *Deleted

## 2016-03-11 DIAGNOSIS — K859 Acute pancreatitis, unspecified: Secondary | ICD-10-CM

## 2016-03-11 MED ORDER — OXYCODONE HCL 5 MG PO TABS: 5.0000 mg | ORAL_TABLET | ORAL | Status: DC | PRN

## 2016-03-11 NOTE — Telephone Encounter (Signed)
Having abd pain every day and needs a refill on his oxycodone

## 2016-03-13 ENCOUNTER — Telehealth: Payer: Self-pay

## 2016-03-13 NOTE — Telephone Encounter (Signed)
Called and spoke with pts wife.  Per Dr. Mike Gip pt to take 2000 mg Hydrea 4 days a week and 1500 mg 3 days a week.  She suggested to spread the 1500 out to Mon, Wed, Fri.  Pts wife verbalized she was writing it down and verbalized an understanding.  No other concerns noted.

## 2016-03-14 ENCOUNTER — Inpatient Hospital Stay (HOSPITAL_BASED_OUTPATIENT_CLINIC_OR_DEPARTMENT_OTHER): Payer: Commercial Managed Care - HMO | Admitting: Oncology

## 2016-03-14 ENCOUNTER — Inpatient Hospital Stay: Payer: Commercial Managed Care - HMO | Attending: Hematology and Oncology | Admitting: Oncology

## 2016-03-14 VITALS — BP 132/81 | HR 63 | Temp 97.3°F | Resp 20 | Wt 210.5 lb

## 2016-03-14 DIAGNOSIS — Z801 Family history of malignant neoplasm of trachea, bronchus and lung: Secondary | ICD-10-CM | POA: Insufficient documentation

## 2016-03-14 DIAGNOSIS — Z86718 Personal history of other venous thrombosis and embolism: Secondary | ICD-10-CM

## 2016-03-14 DIAGNOSIS — Z808 Family history of malignant neoplasm of other organs or systems: Secondary | ICD-10-CM | POA: Diagnosis not present

## 2016-03-14 DIAGNOSIS — R109 Unspecified abdominal pain: Secondary | ICD-10-CM | POA: Diagnosis not present

## 2016-03-14 DIAGNOSIS — Z800 Family history of malignant neoplasm of digestive organs: Secondary | ICD-10-CM | POA: Diagnosis not present

## 2016-03-14 DIAGNOSIS — Z7901 Long term (current) use of anticoagulants: Secondary | ICD-10-CM | POA: Insufficient documentation

## 2016-03-14 DIAGNOSIS — Z86711 Personal history of pulmonary embolism: Secondary | ICD-10-CM | POA: Diagnosis not present

## 2016-03-14 DIAGNOSIS — E7800 Pure hypercholesterolemia, unspecified: Secondary | ICD-10-CM | POA: Insufficient documentation

## 2016-03-14 DIAGNOSIS — Z8051 Family history of malignant neoplasm of kidney: Secondary | ICD-10-CM | POA: Insufficient documentation

## 2016-03-14 DIAGNOSIS — C9110 Chronic lymphocytic leukemia of B-cell type not having achieved remission: Secondary | ICD-10-CM | POA: Insufficient documentation

## 2016-03-14 DIAGNOSIS — Z8551 Personal history of malignant neoplasm of bladder: Secondary | ICD-10-CM | POA: Diagnosis not present

## 2016-03-14 DIAGNOSIS — C259 Malignant neoplasm of pancreas, unspecified: Secondary | ICD-10-CM | POA: Insufficient documentation

## 2016-03-14 DIAGNOSIS — R918 Other nonspecific abnormal finding of lung field: Secondary | ICD-10-CM | POA: Insufficient documentation

## 2016-03-14 DIAGNOSIS — D696 Thrombocytopenia, unspecified: Secondary | ICD-10-CM | POA: Diagnosis not present

## 2016-03-14 DIAGNOSIS — N471 Phimosis: Secondary | ICD-10-CM

## 2016-03-14 DIAGNOSIS — Z87891 Personal history of nicotine dependence: Secondary | ICD-10-CM | POA: Insufficient documentation

## 2016-03-14 DIAGNOSIS — I714 Abdominal aortic aneurysm, without rupture: Secondary | ICD-10-CM | POA: Insufficient documentation

## 2016-03-14 DIAGNOSIS — Z794 Long term (current) use of insulin: Secondary | ICD-10-CM | POA: Diagnosis not present

## 2016-03-14 DIAGNOSIS — R1011 Right upper quadrant pain: Secondary | ICD-10-CM

## 2016-03-14 DIAGNOSIS — E119 Type 2 diabetes mellitus without complications: Secondary | ICD-10-CM | POA: Diagnosis not present

## 2016-03-14 DIAGNOSIS — Z79899 Other long term (current) drug therapy: Secondary | ICD-10-CM | POA: Insufficient documentation

## 2016-03-14 LAB — URINALYSIS COMPLETE WITH MICROSCOPIC (ARMC ONLY)
Bacteria, UA: NONE SEEN
Bilirubin Urine: NEGATIVE
Ketones, ur: NEGATIVE mg/dL
Leukocytes, UA: NEGATIVE
Nitrite: NEGATIVE
PH: 5.5 (ref 5.0–8.0)
PROTEIN: NEGATIVE mg/dL
Specific Gravity, Urine: 1.01 (ref 1.005–1.030)
WBC UA: NONE SEEN WBC/hpf (ref 0–5)

## 2016-03-14 NOTE — Progress Notes (Signed)
Patient would like to discuss the his worsening right side pain that radiates to the low back.  He thinks the pain could be UTI because when he had similar pain he was treated with Cipro for UTI and the pain improved for 1 week before returning then he was give another round of Cipro.  He reports the pain 6/10 on pain scale and has pain medication at home but does not like to take if often due to drowsiness.  Did take a pain medication yesterday afternoon and woke up with pain at 2:30 this morning so took another dose of pain medication.

## 2016-03-16 LAB — URINE CULTURE

## 2016-03-18 ENCOUNTER — Ambulatory Visit
Admission: RE | Admit: 2016-03-18 | Discharge: 2016-03-18 | Disposition: A | Discharge status: Home / Self Care | Payer: Commercial Managed Care - HMO | Source: Ambulatory Visit | Attending: Oncology | Admitting: Oncology

## 2016-03-18 DIAGNOSIS — R1011 Right upper quadrant pain: Secondary | ICD-10-CM | POA: Insufficient documentation

## 2016-03-18 DIAGNOSIS — C259 Malignant neoplasm of pancreas, unspecified: Secondary | ICD-10-CM | POA: Insufficient documentation

## 2016-03-18 MED ORDER — IOPAMIDOL (ISOVUE-300) INJECTION 61%
100.0000 mL | Freq: Once | INTRAVENOUS | Status: AC | PRN
  Administered 2016-03-18: 100 mL via INTRAVENOUS

## 2016-03-20 DIAGNOSIS — E1165 Type 2 diabetes mellitus with hyperglycemia: Secondary | ICD-10-CM | POA: Diagnosis not present

## 2016-03-20 DIAGNOSIS — Z794 Long term (current) use of insulin: Secondary | ICD-10-CM | POA: Diagnosis not present

## 2016-03-23 NOTE — Progress Notes (Signed)
Kenneth Curry  [phone]  [phone]     Kenneth Curry DOB: 1933/05/23  MR#: [id number]  [id number]  Patient Care Team: Ezequiel Kayser, MD as PCP - General (Internal Medicine) Clent Jacks, RN as Registered Nurse  CHIEF COMPLAINT: Increasing abdominal pain, pancreatic cancer. No chief complaint on file.   INTERVAL HISTORY:  Patient returns to clinic today as an add-on with complaints of increasing right flank/abdominal pain. He otherwise feels well and is asymptomatic. He has no neurologic complaints. He denies any recent fevers. He has no chest pain or shortness of breath. He denies any nausea, vomiting, constipation, or diarrhea. He has no urinary complaints. Patient otherwise feels well and offers no further specific complaints.   REVIEW OF SYSTEMS:   Review of Systems  Constitutional: Negative.  Negative for fever, weight loss and malaise/fatigue.  Respiratory: Negative.   Cardiovascular: Negative.  Negative for chest pain.  Gastrointestinal: Positive for abdominal pain. Negative for nausea.  Genitourinary: Negative.   Musculoskeletal: Negative.   Neurological: Negative.  Negative for weakness.    As per HPI. Otherwise, a complete review of systems is negatve.  ONCOLOGY HISTORY: Oncology History   Stage IIB, T2 N1 M0     Pancreatic adenocarcinoma ([place])   01/16/2016 Initial Diagnosis Pancreatic adenocarcinoma [facility])    PAST MEDICAL HISTORY: Past Medical History  Diagnosis Date  . Abdominal aortic aneurysm ([place])   . Pulmonary nodules   . Thrombocytopenia ([place])   . Presence of IVC filter   . Elevated cholesterol   . Diabetes mellitus, type 2 ([place])   . Cellulitis   . History of primary bladder cancer   . Hematuria   . Urinary frequency   . Phimosis   . Pancreatic adenocarcinoma ([place]) 01/16/2016    PAST SURGICAL HISTORY: Past Surgical History  Procedure Laterality Date  . Cholecystectomy    . Cochlear implant    .  Appendectomy    . Abdominal aortic aneurysm repair    . Upper esophageal endoscopic ultrasound (eus) N/A 01/10/2016    Procedure: UPPER ESOPHAGEAL ENDOSCOPIC ULTRASOUND (EUS);  Surgeon: Cora Daniels, MD;  [facility] ENDOSCOPY;  Service: Endoscopy;  Laterality: N/A;    FAMILY HISTORY Family History  Problem Relation Age of Onset  . Lung cancer Brother   . Colon cancer Brother   . Cancer Sister     Renal  . Cancer Mother 60    Kidney  . Cancer Paternal Uncle     melanoma    GYNECOLOGIC HISTORY:  No LMP for male patient.     ADVANCED DIRECTIVES:    HEALTH MAINTENANCE: Social History  Substance Use Topics  . Smoking status: Former Smoker -- 1.00 packs/day    Types: Cigarettes    Quit date: 12/28/2015  . Smokeless tobacco: Not on file  . Alcohol Use: No     Allergies  Allergen Reactions  . Hydrocodone-Acetaminophen Other (See Comments)    Other reaction(s): Other (See Comments) intolerate intolerate  . Metformin Diarrhea  . Atorvastatin Rash    Current Outpatient Prescriptions  Medication Sig Dispense Refill  . Acetaminophen 500 MG coapsule     . Calcium Carbonate-Vitamin D (CALCIUM-D PO) Take 1 tablet by mouth daily.     Marland Kitchen glimepiride (AMARYL) 4 MG tablet     . LANTUS SOLOSTAR 100 UNIT/ML Solostar Pen INJECT 24 UNITS SUBCUTANEOUSLY ONCE DAILY INTO ABDOMEN,THIGHS,OUTER ARMS EVERY NIGHT. ROTATE SITES  3  . levothyroxine (SYNTHROID, LEVOTHROID) 150 MCG tablet Take 150  mcg by mouth daily before breakfast.     . lovastatin (MEVACOR) 20 MG tablet Take 20 mg by mouth at bedtime.     Marland Kitchen NASAL DECONGESTANT SPRAY 0.05 % nasal spray Place 1 spray into both nostrils 2 (two) times daily as needed for congestion.     Marland Kitchen omeprazole (PRILOSEC) 20 MG capsule Take 1 capsule (20 mg total) by mouth 2 (two) times daily before a meal. 30 capsule 5  . ondansetron (ZOFRAN) 4 MG tablet Take 1 tablet (4 mg total) by mouth every 8 (eight) hours as needed for nausea or vomiting. 30  tablet 1  . oxyCODONE (OXY IR/ROXICODONE) 5 MG immediate release tablet Take 1 tablet (5 mg total) by mouth every 4 (four) hours as needed for severe pain. 60 tablet 0  . promethazine (PHENERGAN) 25 MG tablet Take 25 mg by mouth every 6 (six) hours as needed. Reported on 01/28/2016    . warfarin (COUMADIN) 10 MG tablet Take 10 mg by mouth daily.     No current facility-administered medications for this visit.    OBJECTIVE: There were no vitals taken for this visit.   There is no weight on file to calculate BMI.    ECOG FS:1 - Symptomatic but completely ambulatory  General: Well-developed, well-nourished, no acute distress. Eyes: Pink conjunctiva, anicteric sclera. Lungs: Clear to auscultation bilaterally. Heart: Regular rate and rhythm. No rubs, murmurs, or gallops. Abdomen: Soft, nontender, nondistended. No organomegaly noted, normoactive bowel sounds. Musculoskeletal: No edema, cyanosis, or clubbing. Neuro: Alert, answering all questions appropriately. Cranial nerves grossly intact. Skin: No rashes or petechiae noted. Psych: Normal affect.    LAB RESULTS:  Office Visit on 03/14/2016  Component Date Value Ref Range Status  . Color, Urine 03/14/2016 YELLOW  YELLOW Final  . APPearance 03/14/2016 CLEAR  CLEAR Final  . Glucose, UA 03/14/2016 >1000* NEGATIVE mg/dL Final  . Bilirubin Urine 03/14/2016 NEGATIVE  NEGATIVE Final  . Ketones, ur 03/14/2016 NEGATIVE  NEGATIVE mg/dL Final  . Specific Gravity, Urine 03/14/2016 1.010  1.005 - 1.030 Final  . Hgb urine dipstick 03/14/2016 1+* NEGATIVE Final  . pH 03/14/2016 5.5  5.0 - 8.0 Final  . Protein, ur 03/14/2016 NEGATIVE  NEGATIVE mg/dL Final  . Nitrite 03/14/2016 NEGATIVE  NEGATIVE Final  . Leukocytes, UA 03/14/2016 NEGATIVE  NEGATIVE Final  . RBC / HPF 03/14/2016 0-5  0 - 5 RBC/hpf Final  . WBC, UA 03/14/2016 NONE SEEN  0 - 5 WBC/hpf Final  . Bacteria, UA 03/14/2016 NONE SEEN  NONE SEEN Final  . Squamous Epithelial / LPF 03/14/2016  0-5* NONE SEEN Final  . Specimen Description 03/14/2016 URINE, CLEAN CATCH   Final  . Special Requests 03/14/2016 NONE   Final  . Culture 03/14/2016 INSIGNIFICANT GROWTH   Final  . Report Status 03/14/2016 03/16/2016 FINAL   Final   Lab Results  Component Value Date   CA199 287* 01/16/2016     ASSESSMENT: Stage IIa adenocarcinoma the pancreas, history of DVT and CLL.  PLAN:    1. Pancreatic cancer: PET scan results reviewed independently with likely local advancement of disease and a portacaval lymph node but without obvious metastatic disease. After lengthy discussion with patient and his family, they agree that he is a poor surgical candidate and this would not be a good option to pursue. He does not wish to pursue radiation therapy. Chemotherapy was discussed at length including oral Tarceva, but patient states since he is currently essentially asymptomatic he does not wish to  pursue any treatment at this time. He expressed understanding that his cancer will likely advance and possibly become metastatic. He said he would reconsider chemotherapy in the future if he were symptomatic. We also briefly discussed the option of comfort care and hospice in the future if necessary. No intervention is needed at this time.  2. DVT/coumadin managemen: Patient has a history of left lower extremity DVT as well as a PE in 2010. He is currently on 10 mg Coumadin daily lifelong.  Goal INR is between 2 and 3. 3. CLL: White blood cell count is within normal limits, monitor. 4. Pain: UA is negative. CT scan results from March 18, 2016 did not reveal any distinct etiology of his pain other than some mild progression of disease. No intervention is needed at this time. Patient has been instructed to keep his previously scheduled follow-up appointment for further evaluation.  Approximately 30 minutes was spent in discussion of which greater than 50% was consultation.  Patient expressed understanding and was in  agreement with this plan. He also understands that He can call clinic at any time with any questions, concerns, or complaints.    Pancreatic adenocarcinoma [facility])   Staging form: Pancreas, AJCC 7th Edition     Clinical stage from 01/10/2016: Stage IIB (T2, N1, M0) - Signed by Evlyn Kanner, NP on 01/28/2016   Lloyd Huger, MD   03/23/2016 11:35 PM

## 2016-03-23 NOTE — Progress Notes (Signed)
This encounter was created in error - please disregard.

## 2016-03-25 ENCOUNTER — Observation Stay
Admission: EM | Admit: 2016-03-25 | Discharge: 2016-03-27 | Disposition: A | Discharge status: Home / Self Care | Payer: Commercial Managed Care - HMO | Attending: Internal Medicine | Admitting: Internal Medicine

## 2016-03-25 ENCOUNTER — Emergency Department: Payer: Commercial Managed Care - HMO

## 2016-03-25 DIAGNOSIS — Y92007 Garden or yard of unspecified non-institutional (private) residence as the place of occurrence of the external cause: Secondary | ICD-10-CM | POA: Diagnosis not present

## 2016-03-25 DIAGNOSIS — Z8551 Personal history of malignant neoplasm of bladder: Secondary | ICD-10-CM | POA: Insufficient documentation

## 2016-03-25 DIAGNOSIS — E559 Vitamin D deficiency, unspecified: Secondary | ICD-10-CM | POA: Insufficient documentation

## 2016-03-25 DIAGNOSIS — E7800 Pure hypercholesterolemia, unspecified: Secondary | ICD-10-CM | POA: Diagnosis not present

## 2016-03-25 DIAGNOSIS — K219 Gastro-esophageal reflux disease without esophagitis: Secondary | ICD-10-CM | POA: Diagnosis not present

## 2016-03-25 DIAGNOSIS — Z79899 Other long term (current) drug therapy: Secondary | ICD-10-CM | POA: Diagnosis not present

## 2016-03-25 DIAGNOSIS — K859 Acute pancreatitis without necrosis or infection, unspecified: Secondary | ICD-10-CM | POA: Diagnosis not present

## 2016-03-25 DIAGNOSIS — R188 Other ascites: Secondary | ICD-10-CM | POA: Insufficient documentation

## 2016-03-25 DIAGNOSIS — R918 Other nonspecific abnormal finding of lung field: Secondary | ICD-10-CM | POA: Insufficient documentation

## 2016-03-25 DIAGNOSIS — R112 Nausea with vomiting, unspecified: Secondary | ICD-10-CM | POA: Insufficient documentation

## 2016-03-25 DIAGNOSIS — R109 Unspecified abdominal pain: Secondary | ICD-10-CM | POA: Insufficient documentation

## 2016-03-25 DIAGNOSIS — Z9049 Acquired absence of other specified parts of digestive tract: Secondary | ICD-10-CM | POA: Diagnosis not present

## 2016-03-25 DIAGNOSIS — C259 Malignant neoplasm of pancreas, unspecified: Secondary | ICD-10-CM | POA: Insufficient documentation

## 2016-03-25 DIAGNOSIS — R531 Weakness: Secondary | ICD-10-CM | POA: Insufficient documentation

## 2016-03-25 DIAGNOSIS — Z86711 Personal history of pulmonary embolism: Secondary | ICD-10-CM | POA: Diagnosis not present

## 2016-03-25 DIAGNOSIS — Z808 Family history of malignant neoplasm of other organs or systems: Secondary | ICD-10-CM | POA: Diagnosis not present

## 2016-03-25 DIAGNOSIS — Z885 Allergy status to narcotic agent status: Secondary | ICD-10-CM | POA: Insufficient documentation

## 2016-03-25 DIAGNOSIS — Z888 Allergy status to other drugs, medicaments and biological substances status: Secondary | ICD-10-CM | POA: Insufficient documentation

## 2016-03-25 DIAGNOSIS — S0001XA Abrasion of scalp, initial encounter: Secondary | ICD-10-CM | POA: Diagnosis not present

## 2016-03-25 DIAGNOSIS — Z8051 Family history of malignant neoplasm of kidney: Secondary | ICD-10-CM | POA: Insufficient documentation

## 2016-03-25 DIAGNOSIS — R161 Splenomegaly, not elsewhere classified: Secondary | ICD-10-CM | POA: Diagnosis not present

## 2016-03-25 DIAGNOSIS — R296 Repeated falls: Secondary | ICD-10-CM | POA: Diagnosis not present

## 2016-03-25 DIAGNOSIS — E119 Type 2 diabetes mellitus without complications: Secondary | ICD-10-CM | POA: Diagnosis not present

## 2016-03-25 DIAGNOSIS — Z9621 Cochlear implant status: Secondary | ICD-10-CM | POA: Diagnosis not present

## 2016-03-25 DIAGNOSIS — Z86718 Personal history of other venous thrombosis and embolism: Secondary | ICD-10-CM | POA: Diagnosis not present

## 2016-03-25 DIAGNOSIS — Z7901 Long term (current) use of anticoagulants: Secondary | ICD-10-CM | POA: Diagnosis not present

## 2016-03-25 DIAGNOSIS — Z801 Family history of malignant neoplasm of trachea, bronchus and lung: Secondary | ICD-10-CM | POA: Diagnosis not present

## 2016-03-25 DIAGNOSIS — Z87891 Personal history of nicotine dependence: Secondary | ICD-10-CM | POA: Insufficient documentation

## 2016-03-25 DIAGNOSIS — Z800 Family history of malignant neoplasm of digestive organs: Secondary | ICD-10-CM | POA: Insufficient documentation

## 2016-03-25 DIAGNOSIS — E039 Hypothyroidism, unspecified: Secondary | ICD-10-CM | POA: Diagnosis not present

## 2016-03-25 DIAGNOSIS — R001 Bradycardia, unspecified: Secondary | ICD-10-CM | POA: Insufficient documentation

## 2016-03-25 DIAGNOSIS — R1013 Epigastric pain: Secondary | ICD-10-CM | POA: Diagnosis not present

## 2016-03-25 DIAGNOSIS — Z8679 Personal history of other diseases of the circulatory system: Secondary | ICD-10-CM | POA: Diagnosis not present

## 2016-03-25 DIAGNOSIS — E785 Hyperlipidemia, unspecified: Secondary | ICD-10-CM | POA: Diagnosis not present

## 2016-03-25 DIAGNOSIS — R42 Dizziness and giddiness: Secondary | ICD-10-CM | POA: Diagnosis not present

## 2016-03-25 DIAGNOSIS — K5730 Diverticulosis of large intestine without perforation or abscess without bleeding: Secondary | ICD-10-CM | POA: Insufficient documentation

## 2016-03-25 DIAGNOSIS — R1011 Right upper quadrant pain: Secondary | ICD-10-CM | POA: Diagnosis not present

## 2016-03-25 DIAGNOSIS — D696 Thrombocytopenia, unspecified: Secondary | ICD-10-CM | POA: Diagnosis not present

## 2016-03-25 DIAGNOSIS — C9110 Chronic lymphocytic leukemia of B-cell type not having achieved remission: Secondary | ICD-10-CM | POA: Diagnosis not present

## 2016-03-25 DIAGNOSIS — R509 Fever, unspecified: Secondary | ICD-10-CM | POA: Insufficient documentation

## 2016-03-25 DIAGNOSIS — W19XXXA Unspecified fall, initial encounter: Secondary | ICD-10-CM | POA: Diagnosis not present

## 2016-03-25 DIAGNOSIS — A084 Viral intestinal infection, unspecified: Principal | ICD-10-CM | POA: Insufficient documentation

## 2016-03-25 DIAGNOSIS — E860 Dehydration: Secondary | ICD-10-CM | POA: Diagnosis present

## 2016-03-25 DIAGNOSIS — K449 Diaphragmatic hernia without obstruction or gangrene: Secondary | ICD-10-CM | POA: Diagnosis not present

## 2016-03-25 DIAGNOSIS — R1030 Lower abdominal pain, unspecified: Secondary | ICD-10-CM | POA: Diagnosis not present

## 2016-03-25 DIAGNOSIS — R269 Unspecified abnormalities of gait and mobility: Secondary | ICD-10-CM | POA: Insufficient documentation

## 2016-03-25 DIAGNOSIS — Z794 Long term (current) use of insulin: Secondary | ICD-10-CM | POA: Insufficient documentation

## 2016-03-25 LAB — URINALYSIS COMPLETE WITH MICROSCOPIC (ARMC ONLY)
Bilirubin Urine: NEGATIVE
Glucose, UA: >500 mg/dL — AB
Ketones, ur: NEGATIVE mg/dL
LEUKOCYTES UA: NEGATIVE
NITRITE: NEGATIVE
PROTEIN: NEGATIVE mg/dL
Specific Gravity, Urine: 1.017 (ref 1.005–1.030)
Squamous Epithelial / LPF: NONE SEEN
pH: 5 (ref 5.0–8.0)

## 2016-03-25 LAB — CBC WITH DIFFERENTIAL/PLATELET
Basophils Absolute: 0 10*3/uL (ref 0–0.1)
Basophils Relative: 0 %
Eosinophils Absolute: 0 10*3/uL (ref 0–0.7)
HCT: 34 % — ABNORMAL LOW (ref 40.0–52.0)
Hemoglobin: 11.1 g/dL — ABNORMAL LOW (ref 13.0–18.0)
LYMPHS ABS: 2.1 10*3/uL (ref 1.0–3.6)
MCH: 25.7 pg — AB (ref 26.0–34.0)
MCHC: 32.7 g/dL (ref 32.0–36.0)
MCV: 78.5 fL — AB (ref 80.0–100.0)
MONO ABS: 0.5 10*3/uL (ref 0.2–1.0)
Monocytes Relative: 4 %
Neutro Abs: 8.7 10*3/uL — ABNORMAL HIGH (ref 1.4–6.5)
Neutrophils Relative %: 77 %
PLATELETS: 79 10*3/uL — AB (ref 150–440)
RBC: 4.33 MIL/uL — AB (ref 4.40–5.90)
RDW: 16 % — AB (ref 11.5–14.5)
WBC: 11.4 10*3/uL — ABNORMAL HIGH (ref 3.8–10.6)

## 2016-03-25 LAB — CK: Total CK: 35 U/L — ABNORMAL LOW (ref 49–397)

## 2016-03-25 LAB — LACTIC ACID, PLASMA
Lactic Acid, Venous: 1.4 mmol/L (ref 0.5–2.0)
Lactic Acid, Venous: 0.6 mmol/L (ref 0.5–2.0)

## 2016-03-25 LAB — COMPREHENSIVE METABOLIC PANEL
ALT: 13 U/L — AB (ref 17–63)
AST: 19 U/L (ref 15–41)
Albumin: 3.3 g/dL — ABNORMAL LOW (ref 3.5–5.0)
Alkaline Phosphatase: 70 U/L (ref 38–126)
Anion gap: 11 (ref 5–15)
BUN: 17 mg/dL (ref 6–20)
CHLORIDE: 104 mmol/L (ref 101–111)
CO2: 22 mmol/L (ref 22–32)
CREATININE: 1.05 mg/dL (ref 0.61–1.24)
Calcium: 9.1 mg/dL (ref 8.9–10.3)
GFR calc Af Amer: >60 mL/min (ref >60)
GFR calc non Af Amer: >60 mL/min (ref >60)
GLUCOSE: 277 mg/dL — AB (ref 65–99)
Potassium: 3.9 mmol/L (ref 3.5–5.1)
SODIUM: 137 mmol/L (ref 135–145)
Total Bilirubin: 0.8 mg/dL (ref 0.3–1.2)
Total Protein: 6.5 g/dL (ref 6.5–8.1)

## 2016-03-25 LAB — PROTIME-INR
INR: 3.08
PROTHROMBIN TIME: 31.2 s — AB (ref 11.4–15.0)

## 2016-03-25 LAB — TROPONIN I: Troponin I: <0.03 ng/mL (ref <0.031)

## 2016-03-25 LAB — GLUCOSE, CAPILLARY: GLUCOSE-CAPILLARY: 252 mg/dL — AB (ref 65–99)

## 2016-03-25 MED ORDER — MORPHINE SULFATE (PF) 2 MG/ML IV SOLN
2.0000 mg | Freq: Once | INTRAVENOUS | Status: AC
  Administered 2016-03-25: 2 mg via INTRAVENOUS
  Filled 2016-03-25: qty 1

## 2016-03-25 MED ORDER — ONDANSETRON HCL 4 MG/2ML IJ SOLN
4.0000 mg | Freq: Once | INTRAMUSCULAR | Status: AC
  Administered 2016-03-25: 4 mg via INTRAVENOUS

## 2016-03-25 MED ORDER — SODIUM CHLORIDE 0.9 % IV BOLUS (SEPSIS)
1000.0000 mL | Freq: Once | INTRAVENOUS | Status: AC
  Administered 2016-03-25: 1000 mL via INTRAVENOUS

## 2016-03-25 MED ORDER — ONDANSETRON HCL 4 MG/2ML IJ SOLN
INTRAMUSCULAR | Status: AC
  Administered 2016-03-25: 4 mg via INTRAVENOUS
  Filled 2016-03-25: qty 2

## 2016-03-25 NOTE — ED Notes (Signed)
Patient transported to X-ray 

## 2016-03-25 NOTE — H&P (Signed)
[place] [facility] [place] NAME: Kenneth Curry    MR#:  [id number]  DATE OF BIRTH:  May 03, 1933  DATE OF ADMISSION:  03/25/2016  PRIMARY CARE PHYSICIAN: Ezequiel Kayser, MD   REQUESTING/REFERRING PHYSICIAN: Reita Cliche, MD  CHIEF COMPLAINT:   Chief Complaint  Patient presents with  . Fall    HISTORY OF PRESENT ILLNESS:  Kenneth Curry  is a 82 y.o. male who presents with Nausea and vomiting and a fall. Patient states that he had onset of some abdominal pain today, which is not a new pain, but today was associated with nausea vomiting and then a fall in his yard. Patient was unable to get up after the fall, but denies any loss of consciousness or reported seizure activity, including denying any loss of bowel or bladder continence. On arrival here to the ED he was noted to be febrile. Workup in the ED was otherwise largely unremarkable. However, the patient does have a history of pancreatic cancer, and in conjunction with his fever hospitalists were called for admission.  PAST MEDICAL HISTORY:   Past Medical History  Diagnosis Date  . Abdominal aortic aneurysm ([place])   . Pulmonary nodules   . Thrombocytopenia ([place])   . Presence of IVC filter   . Elevated cholesterol   . Diabetes mellitus, type 2 ([place])   . Cellulitis   . History of primary bladder cancer   . Hematuria   . Urinary frequency   . Phimosis   . Pancreatic adenocarcinoma ([place]) 01/16/2016    PAST SURGICAL HISTORY:   Past Surgical History  Procedure Laterality Date  . Cholecystectomy    . Cochlear implant    . Appendectomy    . Abdominal aortic aneurysm repair    . Upper esophageal endoscopic ultrasound (eus) N/A 01/10/2016    Procedure: UPPER ESOPHAGEAL ENDOSCOPIC ULTRASOUND (EUS);  Surgeon: Cora Daniels, MD;  Location: [facility] ENDOSCOPY;  Service: Endoscopy;  Laterality: N/A;    SOCIAL HISTORY:   Social History  Substance Use Topics  . Smoking status: Former Smoker --  1.00 packs/day    Types: Cigarettes    Quit date: 12/28/2015  . Smokeless tobacco: Not on file  . Alcohol Use: No    FAMILY HISTORY:   Family History  Problem Relation Age of Onset  . Lung cancer Brother   . Colon cancer Brother   . Cancer Sister     Renal  . Cancer Mother 75    Kidney  . Cancer Paternal Uncle     melanoma    DRUG ALLERGIES:   Allergies  Allergen Reactions  . Hydrocodone-Acetaminophen Other (See Comments)    Other reaction(s): Other (See Comments) intolerate intolerate  . Metformin Diarrhea  . Atorvastatin Rash    MEDICATIONS AT HOME:   Prior to Admission medications   Medication Sig Start Date End Date Taking? Authorizing Provider  Acetaminophen 500 MG coapsule  12/13/15   Historical Provider, MD  Calcium Carbonate-Vitamin D (CALCIUM-D PO) Take 1 tablet by mouth daily.     Historical Provider, MD  glimepiride (AMARYL) 4 MG tablet  11/21/15   Historical Provider, MD  LANTUS SOLOSTAR 100 UNIT/ML Solostar Pen INJECT 24 UNITS SUBCUTANEOUSLY ONCE DAILY INTO ABDOMEN,THIGHS,OUTER ARMS EVERY NIGHT. ROTATE SITES 01/16/16   Historical Provider, MD  levothyroxine (SYNTHROID, LEVOTHROID) 150 MCG tablet Take 150 mcg by mouth daily before breakfast.  05/30/14   Historical Provider, MD  lovastatin (MEVACOR) 20 MG tablet Take 20 mg by mouth  at bedtime.  11/15/14   Historical Provider, MD  NASAL DECONGESTANT SPRAY 0.05 % nasal spray Place 1 spray into both nostrils 2 (two) times daily as needed for congestion.  11/21/15   Historical Provider, MD  omeprazole (PRILOSEC) 20 MG capsule Take 1 capsule (20 mg total) by mouth 2 (two) times daily before a meal. 01/28/16   Evlyn Kanner, NP  ondansetron (ZOFRAN) 4 MG tablet Take 1 tablet (4 mg total) by mouth every 8 (eight) hours as needed for nausea or vomiting. 01/28/16   Evlyn Kanner, NP  oxyCODONE (OXY IR/ROXICODONE) 5 MG immediate release tablet Take 1 tablet (5 mg total) by mouth every 4 (four) hours as needed for severe  pain. 03/11/16   Lloyd Huger, MD  promethazine (PHENERGAN) 25 MG tablet Take 25 mg by mouth every 6 (six) hours as needed. Reported on 01/28/2016 01/15/16   Historical Provider, MD  warfarin (COUMADIN) 10 MG tablet Take 10 mg by mouth daily.    Historical Provider, MD    REVIEW OF SYSTEMS:  Review of Systems  Constitutional: Positive for fever and malaise/fatigue. Negative for chills and weight loss.  HENT: Negative for ear pain, hearing loss and tinnitus.   Eyes: Negative for blurred vision, double vision, pain and redness.  Respiratory: Negative for cough, hemoptysis and shortness of breath.   Cardiovascular: Negative for chest pain, palpitations, orthopnea and leg swelling.  Gastrointestinal: Negative for nausea, vomiting, abdominal pain, diarrhea and constipation.  Genitourinary: Negative for dysuria, frequency and hematuria.  Musculoskeletal: Positive for falls. Negative for back pain, joint pain and neck pain.  Skin:       No acne, rash, or lesions  Neurological: Negative for dizziness, tremors, focal weakness and weakness.  Endo/Heme/Allergies: Negative for polydipsia. Does not bruise/bleed easily.  Psychiatric/Behavioral: Negative for depression. The patient is not nervous/anxious and does not have insomnia.      VITAL SIGNS:   Filed Vitals:   03/25/16 1927 03/25/16 1930 03/25/16 1959 03/25/16 2000  BP: 103/59 100/54  106/60  Pulse: 66 66  64  Temp: 99.4 F (37.4 C)  98.8 F (37.1 C)   TempSrc: Oral  Oral   Resp: 20 18  24   Height:      Weight:      SpO2: 98% 100%  99%   Wt Readings from Last 3 Encounters:  03/25/16 90.719 kg (200 lb)  03/14/16 95.5 kg (210 lb 8.6 oz)  02/15/16 91.3 kg (201 lb 4.5 oz)    PHYSICAL EXAMINATION:  Physical Exam  Vitals reviewed. Constitutional: He is oriented to person, place, and time. He appears well-developed and well-nourished. No distress.  HENT:  Head: Normocephalic and atraumatic.  Mouth/Throat: Oropharynx is clear and  moist.  Eyes: Conjunctivae and EOM are normal. Pupils are equal, round, and reactive to light. No scleral icterus.  Neck: Normal range of motion. Neck supple. No JVD present. No thyromegaly present.  Cardiovascular: Normal rate, regular rhythm and intact distal pulses.  Exam reveals no gallop and no friction rub.   Murmur (2/6 systolic murmur) heard. Respiratory: Effort normal and breath sounds normal. No respiratory distress. He has no wheezes. He has no rales.  GI: Soft. Bowel sounds are normal. He exhibits no distension. There is tenderness (Right lower quadrant).  Musculoskeletal: Normal range of motion. He exhibits no edema.  No arthritis, no gout  Lymphadenopathy:    He has no cervical adenopathy.  Neurological: He is alert and oriented to person, place, and time. No  cranial nerve deficit.  No dysarthria, no aphasia  Skin: Skin is warm and dry. No rash noted. No erythema.  Psychiatric: He has a normal mood and affect. His behavior is normal. Judgment and thought content normal.    LABORATORY PANEL:   CBC  Recent Labs Lab 03/25/16 1542  WBC 11.4*  HGB 11.1*  HCT 34.0*  PLT 79*   ------------------------------------------------------------------------------------------------------------------  Chemistries   Recent Labs Lab 03/25/16 1542  NA 137  K 3.9  CL 104  CO2 22  GLUCOSE 277*  BUN 17  CREATININE 1.05  CALCIUM 9.1  AST 19  ALT 13*  ALKPHOS 70  BILITOT 0.8   ------------------------------------------------------------------------------------------------------------------  Cardiac Enzymes  Recent Labs Lab 03/25/16 1542  TROPONINI <0.03   ------------------------------------------------------------------------------------------------------------------  RADIOLOGY:  Ct Head Wo Contrast  03/25/2016  CLINICAL DATA:  Dizziness, unwitnessed fall. No reported loss of consciousness. EXAM: CT HEAD WITHOUT CONTRAST TECHNIQUE: Contiguous axial images were  obtained from the base of the skull through the vertex without intravenous contrast. COMPARISON:  None. FINDINGS: Status post right cochlear implant. Bony calvarium appears intact. Mild diffuse cortical atrophy is noted. No mass effect or midline shift is noted. Ventricular size is within normal limits. There is no evidence of mass lesion, hemorrhage or acute infarction. IMPRESSION: Mild diffuse cortical atrophy. No acute intracranial abnormality seen. Electronically Signed   By: Marijo Conception, M.D.   On: 03/25/2016 17:19   Dg Abd Acute W/chest  03/25/2016  CLINICAL DATA:  Weakness and lightheadedness. Pancreatic and bladder cancer. Aortic aneurysm. EXAM: DG ABDOMEN ACUTE W/ 1V CHEST COMPARISON:  03/18/2016 CT FINDINGS: Mild cardiomegaly. Hiatal hernia. Scarring or subsegmental atelectasis in the left mid lung. Atherosclerotic aortic arch. No free intraperitoneal gas beneath the hemidiaphragms. IVC filter noted. Aortoiliac stents. Thoracic and lumbar spondylosis. No dilated bowel. Single air fluid level in a nondilated loop of small bowel in the left abdomen. Sigmoid colon diverticula. Degenerative arthropathy of the hips, left greater than right. Vascular calcification projects along the left renal hilum. Splenomegaly. IMPRESSION: 1. No overt abnormality of the bowel gas pattern. There is a single air fluid level in a left-sided nondilated loop of small bowel, not necessarily abnormal. 2. Other imaging findings of potential clinical significance: Cardiomegaly. Atherosclerosis. Hiatal hernia. IVC filter and aorta iliac stents. Degenerative hip arthropathy. Diverticulosis. Electronically Signed   By: Van Clines M.D.   On: 03/25/2016 18:50    EKG:   Orders placed or performed during the hospital encounter of 03/25/16  . EKG 12-Lead  . EKG 12-Lead  . EKG 12-Lead  . EKG 12-Lead    IMPRESSION AND PLAN:  Principal Problem:   Fall - suspected due to dehydration. Patient sustained a couple had  abrasions, but no further injury and CT head was negative for acute trauma. Active Problems:   Pancreatic adenocarcinoma ([place]) - unclear if this is controlling to his abdominal pain and/or nausea and vomiting. Did not feel the need for imaging at this point, rather we will observe him and monitor   Nausea and vomiting - improved somewhat antiemetics in the ED, we'll keep these available when necessary   Dehydration - unclear etiology, patient did not have prolonged nausea and vomiting prior to his episode. IV fluids for hydration   Controlled type 2 diabetes mellitus without complication (HCC) - sliding scale insulin with corresponding glucose checks and carb modified diet   GERD (gastroesophageal reflux disease) - home dose PPI   HLD (hyperlipidemia) - home meds   Adult  hypothyroidism - home dose thyroid replacement  All the records are reviewed and case discussed with ED provider. Management plans discussed with the patient and/or family.  DVT PROPHYLAXIS: Systemic anticoagulation  GI PROPHYLAXIS: PPI  ADMISSION STATUS: Observation  CODE STATUS: Full Code Status History    Date Active Date Inactive Code Status Order ID Comments User Context   01/17/2016  5:51 PM 01/19/2016  6:07 PM Full Code [id number]  Henreitta Leber, MD Inpatient      TOTAL TIME TAKING CARE OF THIS PATIENT: 40 minutes.    Krysia Zahradnik [place] 03/25/2016, 9:18 PM  [facility]  Office  [phone]  CC: Primary care physician; Ezequiel Kayser, MD

## 2016-03-25 NOTE — ED Provider Notes (Signed)
[facility] Emergency Department Provider Note   ____________________________________________  Time seen: I have reviewed the triage vital signs and the triage nursing note.  HISTORY  Chief Complaint Fall   Historian Patient, spouse and daughter  HPI Kenneth Curry is a 82 y.o. male who lives at home with wife, recently diagnosed several months ago with pancreatic cancer, follows with Dr. Grayland Ormond at the [facility], who is here for dizziness, vomiting, abd pain and fall today.   Patient was at home alone and went outside and he felt lightheaded and fell down striking his forehead on the ground, and he was able to get up on his own. He is outside reportedly for an hour or 2 until his neighbor found him and called EMS for him.  He has been diagnosed with pancreatic cancer, possibly with metastases, and had chosen no surgical treatment, no radiation or chemotherapy, but opened a chemotherapy home or radiation if he became symptomatic. Over the last couple weeks he started to have significant amount of right-sided abdominal pain. He doesn't appoint with Dr. Marc Morgans in the morning after a CT scan obtained last week for re-staging or reevaluation.  He's had some chronic nausea which seems to be worse. He's not had any problems with bowel movements. No diarrhea. No constipation. Pain is right flank and moderate in intensity. No urinary symptoms. He denies fevers. Denies cough. He denies chest pain.    Past Medical History  Diagnosis Date  . Abdominal aortic aneurysm ([place])   . Pulmonary nodules   . Thrombocytopenia ([place])   . Presence of IVC filter   . Elevated cholesterol   . Diabetes mellitus, type 2 ([place])   . Cellulitis   . History of primary bladder cancer   . Hematuria   . Urinary frequency   . Phimosis   . Pancreatic adenocarcinoma ([place]) 01/16/2016    Patient Active Problem List   Diagnosis Date Noted  . Controlled type 2 diabetes mellitus without  complication ([place]) [id number]  . Acute pancreatitis 01/17/2016  . Pancreatic adenocarcinoma ([place]) 01/16/2016  . Carcinoma of pancreas ([place]) 01/16/2016  . Bradycardia 01/02/2016  . Type 2 diabetes mellitus ([place]) 10/23/2015  . Diabetes mellitus, type 2 ([place]) 06/12/2015  . Acid reflux 06/12/2015  . HLD (hyperlipidemia) 06/12/2015  . Adult hypothyroidism 06/12/2015  . Avitaminosis D 06/12/2015  . History of bladder cancer 06/12/2015  . History of neoplasm of bladder 06/12/2015  . Long term current use of anticoagulant 05/18/2015  . Polypharmacy 05/18/2015  . Other long term (current) drug therapy 05/18/2015  . Chronic lymphatic leukemia ([place]) 05/14/2014  . Bilateral hearing loss 05/14/2014  . Chronic lymphocytic leukemia ([place]) 05/14/2014  . Bladder tumor 05/08/2014  . Deep vein thrombosis ([place]) 10/21/2013  . H/O abdominal aortic aneurysm repair 10/21/2013  . Change in blood platelet count 10/21/2013  . Deep vein thrombosis (DVT) ([place]) 10/21/2013  . History of abdominal aortic aneurysm (AAA) repair 10/21/2013  . Thrombocytopenia ([place]) 10/21/2013    Past Surgical History  Procedure Laterality Date  . Cholecystectomy    . Cochlear implant    . Appendectomy    . Abdominal aortic aneurysm repair    . Upper esophageal endoscopic ultrasound (eus) N/A 01/10/2016    Procedure: UPPER ESOPHAGEAL ENDOSCOPIC ULTRASOUND (EUS);  Surgeon: Cora Daniels, MD;  Location: [facility] ENDOSCOPY;  Service: Endoscopy;  Laterality: N/A;    Current Outpatient Rx  Name  Route  Sig  Dispense  Refill  . Acetaminophen 500 MG  coapsule               . Calcium Carbonate-Vitamin D (CALCIUM-D PO)   Oral   Take 1 tablet by mouth daily.          Marland Kitchen glimepiride (AMARYL) 4 MG tablet               . LANTUS SOLOSTAR 100 UNIT/ML Solostar Pen      INJECT 24 UNITS SUBCUTANEOUSLY ONCE DAILY INTO ABDOMEN,THIGHS,OUTER ARMS EVERY NIGHT. ROTATE SITES      3     Dispense as written.   Marland Kitchen levothyroxine  (SYNTHROID, LEVOTHROID) 150 MCG tablet   Oral   Take 150 mcg by mouth daily before breakfast.          . lovastatin (MEVACOR) 20 MG tablet   Oral   Take 20 mg by mouth at bedtime.          Marland Kitchen NASAL DECONGESTANT SPRAY 0.05 % nasal spray   Each Nare   Place 1 spray into both nostrils 2 (two) times daily as needed for congestion.            Dispense as written.   Marland Kitchen omeprazole (PRILOSEC) 20 MG capsule   Oral   Take 1 capsule (20 mg total) by mouth 2 (two) times daily before a meal.   30 capsule   5   . ondansetron (ZOFRAN) 4 MG tablet   Oral   Take 1 tablet (4 mg total) by mouth every 8 (eight) hours as needed for nausea or vomiting.   30 tablet   1   . oxyCODONE (OXY IR/ROXICODONE) 5 MG immediate release tablet   Oral   Take 1 tablet (5 mg total) by mouth every 4 (four) hours as needed for severe pain.   60 tablet   0   . promethazine (PHENERGAN) 25 MG tablet   Oral   Take 25 mg by mouth every 6 (six) hours as needed. Reported on 01/28/2016         . warfarin (COUMADIN) 10 MG tablet   Oral   Take 10 mg by mouth daily.           Allergies Hydrocodone-acetaminophen; Metformin; and Atorvastatin  Family History  Problem Relation Age of Onset  . Lung cancer Brother   . Colon cancer Brother   . Cancer Sister     Renal  . Cancer Mother 38    Kidney  . Cancer Paternal Uncle     melanoma    Social History Social History  Substance Use Topics  . Smoking status: Former Smoker -- 1.00 packs/day    Types: Cigarettes    Quit date: 12/28/2015  . Smokeless tobacco: None  . Alcohol Use: No    Review of Systems  Constitutional: Negative for feverOr chills. Eyes: Negative for visual changes. ENT: Negative for sore throat. Cardiovascular: Negative for chest pain. Respiratory: Negative for shortness of breath. Gastrointestinal: As per history of present illness. Genitourinary: Negative for dysuria. Musculoskeletal: Negative for back pain. Skin: Negative  for rash. Neurological: Negative for headache. 10 point Review of Systems otherwise negative ____________________________________________   PHYSICAL EXAM:  VITAL SIGNS: ED Triage Vitals  Enc Vitals Group     BP 03/25/16 1538 118/72 mmHg     Pulse Rate 03/25/16 1540 92     Resp 03/25/16 1538 27     Temp 03/25/16 1537 100.4 F (38 C)     Temp Source 03/25/16 1927 Oral  SpO2 03/25/16 1540 94 %     Weight 03/25/16 1537 200 lb (90.719 kg)     Height 03/25/16 1537 5\' 8"  (1.727 m)     Head Cir --      Peak Flow --      Pain Score 03/25/16 1537 5     Pain Loc --      Pain Edu? --      Excl. in [place]? --      Constitutional: Alert and Cooperative. Well appearing and in no distress. HEENT   Head: Abrasion to the top of the scalp near the forehead frontally.      Eyes: Conjunctivae are normal. PERRL. Normal extraocular movements.      Ears:         Nose: No congestion/rhinnorhea.   Mouth/Throat: Mucous membranes are mildly dry.   Neck: No stridor. Cardiovascular/Chest: Normal rate, regular rhythm.  No murmurs, rubs, or gallops. Respiratory: Normal respiratory effort without tachypnea nor retractions. Breath sounds are clear and equal bilaterally. No wheezes/rales/rhonchi. Gastrointestinal: Soft. No distention, no guarding, no rebound. Moderate tenderness in epigastrium and right upper quadrant and right flank.  Genitourinary/rectal:Deferred Musculoskeletal: Nontender with normal range of motion in all extremities. No joint effusions.  No lower extremity tenderness.  No edema. Neurologic:  Normal speech and language. No gross or focal neurologic deficits are appreciated. Skin:  Skin is warm, dry and intact. No rash noted. Psychiatric: Mood and affect are normal. Speech and behavior are normal. Patient exhibits appropriate insight and judgment.  ____________________________________________   EKG I, Lisa Roca, MD, the attending physician have personally viewed and  interpreted all ECGs.  97 bpm. Normal sinus rhythm. Narrow QRS. Left axis deviation. ____________________________________________  LABS (pertinent positives/negatives)  Initial lactate 1.4, repeat lactate 0.6 Urinalysis negative Comprehensive metabolic panel significant for glucose 277 otherwise without significant abnormalities Troponin less than 0.03 White blood count 11.4, hemoglobin 11.1 and platelet count 79 INR 3.08  ____________________________________________  RADIOLOGY All Xrays were viewed by me. Imaging interpreted by Radiologist.  Chest and abdomen x-ray:  IMPRESSION: 1. No overt abnormality of the bowel gas pattern. There is a single air fluid level in a left-sided nondilated loop of small bowel, not necessarily abnormal. 2. Other imaging findings of potential clinical significance: Cardiomegaly. Atherosclerosis. Hiatal hernia. IVC filter and aorta iliac stents. Degenerative hip arthropathy. Diverticulosis.  CT head without contrast:  IMPRESSION: Mild diffuse cortical atrophy. No acute intracranial abnormality seen. __________________________________________  PROCEDURES  Procedure(s) performed: None  Critical Care performed: None  ____________________________________________   ED COURSE / ASSESSMENT AND PLAN  Pertinent labs & imaging results that were available during my care of the patient were reviewed by me and considered in my medical decision making (see chart for details).   This patient is here after dizziness and fall with head injury. His head CT is negative for intracranial traumatic injury. Doesn't sound like he is having strokelike symptoms. It sounds like his dizziness is likely related to dehydration. He did have a slightly low blood pressure was given IV fluid hydration. His BUN and creatinine do not show acute renal failure, but I do suspect the low blood pressure clinically and dizziness related to dehydration.  Additionally the  patient is complaining of frequent nausea, decreased ability to take in by mouth. It's unclear to me if this is actually progress and related to his known pancreatic cancer, versus a new problem. Stretching more as related to the pancreatic cancer, and not to a new obstruction. His  x-ray is reassuring. His abdomen is soft and without distention. He has been having bowel movements I'm less suspicious of obstruction.  The patient is having his right side is the same as was documented by Dr. Gary Fleet recent note. He had a CT scan last week that did show progression of the pancreatic cancer, but no additional signs of emergency surgical condition.  He did have a temperature here of 100.7. No obvious source, including no respiratory complaints or viral complaints. Blood cultures were sent. His lactate was decreasing as a result of IV fluids.  Due to the ongoing nausea, pain, metastatic cancer, low-grade temperature, and dizziness, I am admitted overnight for observation.    CONSULTATIONS:   Hospitalist for admission, Dr. Manuella Ghazi.   Patient / Family / Caregiver informed of clinical course, medical decision-making process, and agree with plan.    ___________________________________________   FINAL CLINICAL IMPRESSION(S) / ED DIAGNOSES   Final diagnoses:  Malignant neoplasm of pancreas, unspecified location of malignancy ([place])  Abdominal pain, right lateral  Nausea and vomiting, vomiting of unspecified type  Fever, unspecified fever cause              Note: This dictation was prepared with Dragon dictation. Any transcriptional errors that result from this process are unintentional   Lisa Roca, MD 03/25/16 2035

## 2016-03-25 NOTE — ED Notes (Signed)
Pt from home via EMS, reports he "felt sick and dizzy and fell" could not get up because he was too "swimmy headed" neighbors found him outside after being on the ground for approx. 1Hr. Has abrasions on head from where his head "scraped the ground" denies LOC. Hx of pancreatic cancer

## 2016-03-26 ENCOUNTER — Inpatient Hospital Stay: Payer: Commercial Managed Care - HMO | Admitting: Oncology

## 2016-03-26 ENCOUNTER — Inpatient Hospital Stay: Payer: Commercial Managed Care - HMO

## 2016-03-26 DIAGNOSIS — C9110 Chronic lymphocytic leukemia of B-cell type not having achieved remission: Secondary | ICD-10-CM | POA: Diagnosis not present

## 2016-03-26 DIAGNOSIS — W19XXXS Unspecified fall, sequela: Secondary | ICD-10-CM | POA: Diagnosis not present

## 2016-03-26 DIAGNOSIS — Z86718 Personal history of other venous thrombosis and embolism: Secondary | ICD-10-CM | POA: Diagnosis not present

## 2016-03-26 DIAGNOSIS — R112 Nausea with vomiting, unspecified: Secondary | ICD-10-CM | POA: Diagnosis not present

## 2016-03-26 DIAGNOSIS — E860 Dehydration: Secondary | ICD-10-CM | POA: Diagnosis not present

## 2016-03-26 DIAGNOSIS — R296 Repeated falls: Secondary | ICD-10-CM | POA: Diagnosis not present

## 2016-03-26 DIAGNOSIS — C259 Malignant neoplasm of pancreas, unspecified: Secondary | ICD-10-CM | POA: Diagnosis not present

## 2016-03-26 LAB — GLUCOSE, CAPILLARY
GLUCOSE-CAPILLARY: 87 mg/dL (ref 65–99)
GLUCOSE-CAPILLARY: 178 mg/dL — AB (ref 65–99)
GLUCOSE-CAPILLARY: 156 mg/dL — AB (ref 65–99)
Glucose-Capillary: 155 mg/dL — ABNORMAL HIGH (ref 65–99)
Glucose-Capillary: 97 mg/dL (ref 65–99)

## 2016-03-26 LAB — BLOOD CULTURE ID PANEL (REFLEXED)
Acinetobacter baumannii: NOT DETECTED
CANDIDA ALBICANS: NOT DETECTED
CANDIDA GLABRATA: NOT DETECTED
CANDIDA KRUSEI: NOT DETECTED
CANDIDA PARAPSILOSIS: NOT DETECTED
CANDIDA TROPICALIS: NOT DETECTED
Carbapenem resistance: NOT DETECTED
ENTEROBACTER CLOACAE COMPLEX: NOT DETECTED
ESCHERICHIA COLI: NOT DETECTED
Enterobacteriaceae species: NOT DETECTED
Enterococcus species: NOT DETECTED
HAEMOPHILUS INFLUENZAE: NOT DETECTED
KLEBSIELLA PNEUMONIAE: NOT DETECTED
Klebsiella oxytoca: NOT DETECTED
LISTERIA MONOCYTOGENES: NOT DETECTED
METHICILLIN RESISTANCE: NOT DETECTED
Neisseria meningitidis: NOT DETECTED
PROTEUS SPECIES: NOT DETECTED
Pseudomonas aeruginosa: NOT DETECTED
STREPTOCOCCUS PNEUMONIAE: NOT DETECTED
STREPTOCOCCUS PYOGENES: NOT DETECTED
Serratia marcescens: NOT DETECTED
Staphylococcus aureus (BCID): NOT DETECTED
Staphylococcus species: NOT DETECTED
Streptococcus agalactiae: NOT DETECTED
Streptococcus species: DETECTED — AB
Vancomycin resistance: NOT DETECTED

## 2016-03-26 LAB — BASIC METABOLIC PANEL
Anion gap: 6 (ref 5–15)
BUN: 14 mg/dL (ref 6–20)
CHLORIDE: 103 mmol/L (ref 101–111)
CO2: 25 mmol/L (ref 22–32)
CREATININE: 1.08 mg/dL (ref 0.61–1.24)
Calcium: 7.9 mg/dL — ABNORMAL LOW (ref 8.9–10.3)
GFR calc non Af Amer: >60 mL/min (ref >60)
GLUCOSE: 89 mg/dL (ref 65–99)
Potassium: 3.5 mmol/L (ref 3.5–5.1)
Sodium: 134 mmol/L — ABNORMAL LOW (ref 135–145)

## 2016-03-26 LAB — CBC
HCT: 30.8 % — ABNORMAL LOW (ref 40.0–52.0)
Hemoglobin: 10 g/dL — ABNORMAL LOW (ref 13.0–18.0)
MCH: 25.4 pg — AB (ref 26.0–34.0)
MCHC: 32.7 g/dL (ref 32.0–36.0)
MCV: 77.6 fL — AB (ref 80.0–100.0)
PLATELETS: 71 10*3/uL — AB (ref 150–440)
RBC: 3.96 MIL/uL — AB (ref 4.40–5.90)
RDW: 15.8 % — ABNORMAL HIGH (ref 11.5–14.5)
WBC: 7.6 10*3/uL (ref 3.8–10.6)

## 2016-03-26 LAB — HEMOGLOBIN A1C: Hgb A1c MFr Bld: 10.8 % — ABNORMAL HIGH (ref 4.0–6.0)

## 2016-03-26 LAB — PROTIME-INR
INR: 3.24
Prothrombin Time: 32.4 seconds — ABNORMAL HIGH (ref 11.4–15.0)

## 2016-03-26 MED ORDER — ACETAMINOPHEN 650 MG RE SUPP: 650.0000 mg | Freq: Four times a day (QID) | RECTAL | Status: DC | PRN

## 2016-03-26 MED ORDER — PANTOPRAZOLE SODIUM 40 MG PO TBEC
40.0000 mg | DELAYED_RELEASE_TABLET | Freq: Two times a day (BID) | ORAL | Status: DC
  Administered 2016-03-26 – 2016-03-27 (×4): 40 mg via ORAL
  Filled 2016-03-26 (×4): qty 1

## 2016-03-26 MED ORDER — SODIUM CHLORIDE 0.9% FLUSH
3.0000 mL | Freq: Two times a day (BID) | INTRAVENOUS | Status: DC
  Administered 2016-03-26 – 2016-03-27 (×2): 3 mL via INTRAVENOUS

## 2016-03-26 MED ORDER — PRAVASTATIN SODIUM 20 MG PO TABS
20.0000 mg | ORAL_TABLET | Freq: Every day | ORAL | Status: DC
  Administered 2016-03-26: 18:00:00 20 mg via ORAL
  Filled 2016-03-26: qty 1

## 2016-03-26 MED ORDER — MORPHINE SULFATE (PF) 2 MG/ML IV SOLN
2.0000 mg | INTRAVENOUS | Status: DC | PRN
  Administered 2016-03-26 (×2): 2 mg via INTRAVENOUS
  Filled 2016-03-26 (×2): qty 1

## 2016-03-26 MED ORDER — ACETAMINOPHEN 325 MG PO TABS
650.0000 mg | ORAL_TABLET | Freq: Four times a day (QID) | ORAL | Status: DC | PRN
Start: 2016-03-26 — End: 2016-03-27

## 2016-03-26 MED ORDER — OXYCODONE HCL 5 MG PO TABS
5.0000 mg | ORAL_TABLET | Freq: Four times a day (QID) | ORAL | Status: DC | PRN
  Administered 2016-03-26: 5 mg via ORAL
  Filled 2016-03-26: qty 1

## 2016-03-26 MED ORDER — SODIUM CHLORIDE 0.9 % IV SOLN
INTRAVENOUS | Status: AC
Start: 2016-03-26 — End: 2016-03-26
  Administered 2016-03-26: 01:00:00 via INTRAVENOUS

## 2016-03-26 MED ORDER — LEVOTHYROXINE SODIUM 150 MCG PO TABS
150.0000 ug | ORAL_TABLET | Freq: Every day | ORAL | Status: DC
  Administered 2016-03-26 – 2016-03-27 (×2): 150 ug via ORAL
  Filled 2016-03-26 (×2): qty 1

## 2016-03-26 MED ORDER — ONDANSETRON HCL 4 MG/2ML IJ SOLN
4.0000 mg | Freq: Four times a day (QID) | INTRAMUSCULAR | Status: DC | PRN
  Administered 2016-03-26: 09:00:00 4 mg via INTRAVENOUS
  Filled 2016-03-26: qty 2

## 2016-03-26 MED ORDER — INSULIN ASPART 100 UNIT/ML ~~LOC~~ SOLN
0.0000 [IU] | Freq: Three times a day (TID) | SUBCUTANEOUS | Status: DC
  Administered 2016-03-26 – 2016-03-27 (×4): 2 [IU] via SUBCUTANEOUS
  Administered 2016-03-27: 7 [IU] via SUBCUTANEOUS
  Filled 2016-03-26 (×4): qty 2
  Filled 2016-03-26: qty 7

## 2016-03-26 MED ORDER — ONDANSETRON HCL 4 MG PO TABS
4.0000 mg | ORAL_TABLET | Freq: Four times a day (QID) | ORAL | Status: DC | PRN
  Administered 2016-03-26: 01:00:00 4 mg via ORAL
  Filled 2016-03-26: qty 1

## 2016-03-26 NOTE — Progress Notes (Signed)
Patient ID: Kenneth Curry, male   DOB: December 08, 1933, 82 y.o.   MRN: [id number] [facility] NAME: Kenneth Curry    MR#:  [id number]  DATE OF BIRTH:  1933-05-30  SUBJECTIVE:  Came in after fall at home and thereafter vomiting.  Feels some better. Main complaint is weakness  REVIEW OF SYSTEMS:   Review of Systems  Constitutional: Negative for fever, chills and weight loss.  HENT: Negative for ear discharge, ear pain and nosebleeds.   Eyes: Negative for blurred vision, pain and discharge.  Respiratory: Negative for sputum production, shortness of breath, wheezing and stridor.   Cardiovascular: Negative for chest pain, palpitations, orthopnea and PND.  Gastrointestinal: Negative for nausea, vomiting, abdominal pain and diarrhea.  Genitourinary: Negative for urgency and frequency.  Musculoskeletal: Positive for back pain and falls. Negative for joint pain.  Neurological: Positive for weakness. Negative for sensory change, speech change and focal weakness.  Psychiatric/Behavioral: Negative for depression and hallucinations. The patient is not nervous/anxious.   All other systems reviewed and are negative.  Tolerating Diet:yes Tolerating PT: pending  DRUG ALLERGIES:   Allergies  Allergen Reactions  . Hydrocodone-Acetaminophen Other (See Comments)    Reaction:  Unknown   . Metformin Diarrhea  . Atorvastatin Rash    VITALS:  Blood pressure 96/50, pulse 57, temperature 99.4 F (37.4 C), temperature source Oral, resp. rate 18, height 5\' 8"  (1.727 m), weight 91.853 kg (202 lb 8 oz), SpO2 95 %.  PHYSICAL EXAMINATION:   Physical Exam  GENERAL:  82 y.o.-year-old patient lying in the bed with no acute distress. Superficial scalp abrasion EYES: Pupils equal, round, reactive to light and accommodation. No scleral icterus. Extraocular muscles intact.  HEENT: Head atraumatic, normocephalic. Oropharynx and nasopharynx clear.  NECK:   Supple, no jugular venous distention. No thyroid enlargement, no tenderness.  LUNGS: Normal breath sounds bilaterally, no wheezing, rales, rhonchi. No use of accessory muscles of respiration.  CARDIOVASCULAR: S1, S2 normal. No murmurs, rubs, or gallops.  ABDOMEN: Soft, nontender, nondistended. Bowel sounds present. No organomegaly or mass.  EXTREMITIES: No cyanosis, clubbing or edema b/l.    NEUROLOGIC: Cranial nerves II through XII are intact. No focal Motor or sensory deficits b/l.   PSYCHIATRIC:  patient is alert and oriented x 3.  SKIN: No obvious rash, lesion, or ulcer.   LABORATORY PANEL:  CBC  Recent Labs Lab 03/26/16 0538  WBC 7.6  HGB 10.0*  HCT 30.8*  PLT 71*    Chemistries   Recent Labs Lab 03/25/16 1542 03/26/16 0538  NA 137 134*  K 3.9 3.5  CL 104 103  CO2 22 25  GLUCOSE 277* 89  BUN 17 14  CREATININE 1.05 1.08  CALCIUM 9.1 7.9*  AST 19  --   ALT 13*  --   ALKPHOS 70  --   BILITOT 0.8  --    Cardiac Enzymes  Recent Labs Lab 03/25/16 1542  TROPONINI <0.03   RADIOLOGY:  Ct Head Wo Contrast  03/25/2016  CLINICAL DATA:  Dizziness, unwitnessed fall. No reported loss of consciousness. EXAM: CT HEAD WITHOUT CONTRAST TECHNIQUE: Contiguous axial images were obtained from the base of the skull through the vertex without intravenous contrast. COMPARISON:  None. FINDINGS: Status post right cochlear implant. Bony calvarium appears intact. Mild diffuse cortical atrophy is noted. No mass effect or midline shift is noted. Ventricular size is within normal limits. There is no evidence of mass lesion, hemorrhage or acute  infarction. IMPRESSION: Mild diffuse cortical atrophy. No acute intracranial abnormality seen. Electronically Signed   By: Marijo Conception, M.D.   On: 03/25/2016 17:19   Dg Abd Acute W/chest  03/25/2016  CLINICAL DATA:  Weakness and lightheadedness. Pancreatic and bladder cancer. Aortic aneurysm. EXAM: DG ABDOMEN ACUTE W/ 1V CHEST COMPARISON:   03/18/2016 CT FINDINGS: Mild cardiomegaly. Hiatal hernia. Scarring or subsegmental atelectasis in the left mid lung. Atherosclerotic aortic arch. No free intraperitoneal gas beneath the hemidiaphragms. IVC filter noted. Aortoiliac stents. Thoracic and lumbar spondylosis. No dilated bowel. Single air fluid level in a nondilated loop of small bowel in the left abdomen. Sigmoid colon diverticula. Degenerative arthropathy of the hips, left greater than right. Vascular calcification projects along the left renal hilum. Splenomegaly. IMPRESSION: 1. No overt abnormality of the bowel gas pattern. There is a single air fluid level in a left-sided nondilated loop of small bowel, not necessarily abnormal. 2. Other imaging findings of potential clinical significance: Cardiomegaly. Atherosclerosis. Hiatal hernia. IVC filter and aorta iliac stents. Degenerative hip arthropathy. Diverticulosis. Electronically Signed   By: Van Clines M.D.   On: 03/25/2016 18:50   ASSESSMENT AND PLAN:  Kenneth Curry is a 82 y.o. male who presents with Nausea and vomiting and a fall. Patient states that he had onset of some abdominal pain today, which is not a new pain, but today was associated with nausea vomiting and then a fall in his yard.  *Fall - suspected due to dehydration. Patient sustained a couple had abrasions, but no further injury and CT head was negative for acute trauma. -PT to see pt  * Pancreatic adenocarcinoma ([place]) - unclear if this is controlling to his abdominal pain and/or nausea and vomiting. Did not feel the need for imaging at this point, rather we will observe him and monitor  * Nausea and vomiting - improved somewhat antiemetics in the ED, we'll keep these available when necessary  * h/o DVT on coumadin INR 3.24  *Controlled type 2 diabetes mellitus without complication (HCC) - sliding scale insulin with corresponding glucose checks and carb modified diet  * GERD (gastroesophageal reflux disease)  - home dose PPI  * HLD (hyperlipidemia) - home meds  *t hypothyroidism - home dose thyroid replacement  Case discussed with Care Management/Social Worker. Management plans discussed with the patient, family and they are in agreement.  CODE STATUS: full  DVT Prophylaxis: on warfarin  TOTAL TIME TAKING CARE OF THIS PATIENT: 30 minutes.  >50% time spent on counselling and coordination of care pt and family  POSSIBLE D/C IN1 DAYS, DEPENDING ON CLINICAL CONDITION.  Note: This dictation was prepared with Dragon dictation along with smaller phrase technology. Any transcriptional errors that result from this process are unintentional.  Joana Nolton M.D on 03/26/2016 at 5:56 PM  Between 7am to 6pm - Pager - [phone]  After 6pm go to [email or web address] - password [email or web address] [facility]  Office  [phone]  CC: Primary care physician; Ezequiel Kayser, MD

## 2016-03-26 NOTE — Progress Notes (Signed)
[facility] note  Lab called [facility] Streptococcus spp. GPC x1 anaerobic. Patient is afebrile and WBC WNL. No antibiotics started per conversation with hospitalist.

## 2016-03-26 NOTE — Progress Notes (Addendum)
ANTICOAGULATION CONSULT NOTE - Initial Consult  Pharmacy Consult for warfarin dosing Indication: VTE prophylaxis  Allergies  Allergen Reactions  . Hydrocodone-Acetaminophen Other (See Comments)    Reaction:  Unknown   . Metformin Diarrhea  . Atorvastatin Rash    Patient Measurements: Height: 5\' 8"  (172.7 cm) Weight: 200 lb (90.719 kg) IBW/kg (Calculated) : 68.4 Heparin Dosing Weight: n/a  Vital Signs: Temp: 99.3 F (37.4 C) (04/19 0027) Temp Source: Oral (04/19 0027) BP: 132/118 mmHg (04/19 0027) Pulse Rate: 73 (04/19 0027)  Labs:  Recent Labs  03/25/16 1542  HGB 11.1*  HCT 34.0*  PLT 79*  LABPROT 31.2*  INR 3.08  CREATININE 1.05  CKTOTAL 35*  TROPONINI <0.03    Estimated Creatinine Clearance: 61.3 mL/min (by C-G formula based on Cr of 1.05).   Medical History: Past Medical History  Diagnosis Date  . Abdominal aortic aneurysm ([place])   . Pulmonary nodules   . Thrombocytopenia ([place])   . Presence of IVC filter   . Elevated cholesterol   . Diabetes mellitus, type 2 ([place])   . Cellulitis   . History of primary bladder cancer   . Hematuria   . Urinary frequency   . Phimosis   . Pancreatic adenocarcinoma ([place]) 01/16/2016    Medications:    Assessment: INR on admission 3.08   Goal of Therapy:  INR 2-3    Plan:  Home dose of warfarin is 10 mg daily per med rec. INR is supratherapeutic on admission. Will reorder INR in AM and restart warfarin if in therapeutic range.  4/19 AM INR 3.24. No warfarin dose today. Recheck INR tomorrow AM.  Dnasia Gauna S 03/26/2016,1:30 AM

## 2016-03-26 NOTE — Care Management Obs Status (Signed)
MEDICARE OBSERVATION STATUS NOTIFICATION   Patient Details  Name: Kenneth Curry MRN: [id number] Date of Birth: Aug 05, 1933   Medicare Observation Status Notification Given:  Yes    Beverly Sessions, RN 03/26/2016, 1:30 PM

## 2016-03-26 NOTE — Consult Note (Signed)
[place]  [phone]) [phone] [phone]  ID: Kenneth Curry OB: 07-Jun-1933  MR#: [id number]  [id number]  Patient Care Team: Ezequiel Kayser, MD as PCP - General (Internal Medicine) Clent Jacks, RN as Registered Nurse  CHIEF COMPLAINT:  Chief Complaint  Patient presents with  . Fall    INTERVAL HISTORY: Patient is an 82 year old male with known progressive pancreatic cancer. He was last evaluated in clinic approximately 2 weeks ago with increasing abdominal pain. He is recently admitted to the hospital with nausea and vomiting associated with the fall. He also states he has continued abdominal pain. After his fall he is unable to get up, but denies any seizures or loss of consciousness. He has increased weakness and fatigue. He also presented with a low-grade fever. He has no neurologic complaints. He denies any chest pain or shortness of breath. He has no urinary complaint. Patient otherwise feels well and offers no further specific complaints.  REVIEW OF SYSTEMS:   Review of Systems  Constitutional: Positive for fever and malaise/fatigue. Negative for weight loss.  Respiratory: Negative.  Negative for shortness of breath.   Cardiovascular: Negative.  Negative for chest pain.  Gastrointestinal: Positive for nausea, vomiting and abdominal pain.  Genitourinary: Negative.   Musculoskeletal: Negative.   Neurological: Positive for weakness.  Psychiatric/Behavioral: Negative.     As per HPI. Otherwise, a complete review of systems is negatve.  PAST MEDICAL HISTORY: Past Medical History  Diagnosis Date  . Abdominal aortic aneurysm ([place])   . Pulmonary nodules   . Thrombocytopenia ([place])   . Presence of IVC filter   . Elevated cholesterol   . Diabetes mellitus, type 2 ([place])   . Cellulitis   . History of primary bladder cancer   . Hematuria   . Urinary frequency   . Phimosis   . Pancreatic adenocarcinoma ([place]) 01/16/2016    PAST SURGICAL  HISTORY: Past Surgical History  Procedure Laterality Date  . Cholecystectomy    . Cochlear implant    . Appendectomy    . Abdominal aortic aneurysm repair    . Upper esophageal endoscopic ultrasound (eus) N/A 01/10/2016    Procedure: UPPER ESOPHAGEAL ENDOSCOPIC ULTRASOUND (EUS);  Surgeon: Cora Daniels, MD;  Location: [facility] ENDOSCOPY;  Service: Endoscopy;  Laterality: N/A;    FAMILY HISTORY Family History  Problem Relation Age of Onset  . Lung cancer Brother   . Colon cancer Brother   . Cancer Sister     Renal  . Cancer Mother 96    Kidney  . Cancer Paternal Uncle     melanoma       ADVANCED DIRECTIVES:    HEALTH MAINTENANCE: Social History  Substance Use Topics  . Smoking status: Former Smoker -- 1.00 packs/day    Types: Cigarettes    Quit date: 12/28/2015  . Smokeless tobacco: None  . Alcohol Use: No     Colonoscopy:  PAP:  Bone density:  Lipid panel:  Allergies  Allergen Reactions  . Hydrocodone-Acetaminophen Other (See Comments)    Reaction:  Unknown   . Metformin Diarrhea  . Atorvastatin Rash    Current Facility-Administered Medications  Medication Dose Route Frequency Provider Last Rate Last Dose  . acetaminophen (TYLENOL) tablet 650 mg  650 mg Oral Q6H PRN Lance Coon, MD       Or  . acetaminophen (TYLENOL) suppository 650 mg  650 mg Rectal Q6H PRN Lance Coon, MD      . insulin aspart (novoLOG) injection 0-9  Units  0-9 Units Subcutaneous TID AC & HS Lance Coon, MD   2 Units at 03/26/16 1144  . levothyroxine (SYNTHROID, LEVOTHROID) tablet 150 mcg  150 mcg Oral QAC breakfast Lance Coon, MD   150 mcg at 03/26/16 0913  . morphine 2 MG/ML injection 2 mg  2 mg Intravenous Q4H PRN Lance Coon, MD   2 mg at 03/26/16 0919  . ondansetron (ZOFRAN) tablet 4 mg  4 mg Oral Q6H PRN Lance Coon, MD   4 mg at 03/26/16 0052   Or  . ondansetron [facility]) injection 4 mg  4 mg Intravenous Q6H PRN Lance Coon, MD   4 mg at 03/26/16 0913  . pantoprazole  (PROTONIX) EC tablet 40 mg  40 mg Oral BID Lance Coon, MD   40 mg at 03/26/16 0913  . pravastatin (PRAVACHOL) tablet 20 mg  20 mg Oral q1800 Lance Coon, MD      . sodium chloride flush (NS) 0.9 % injection 3 mL  3 mL Intravenous Q12H Lance Coon, MD   3 mL at 03/26/16 0053    OBJECTIVE: Filed Vitals:   03/26/16 0509 03/26/16 0917  BP: 96/54 96/50  Pulse: 65 57  Temp: 99.4 F (37.4 C)   Resp: 18      Body mass index is 30.8 kg/(m^2).    ECOG FS:1 - Symptomatic but completely ambulatory  General: Well-developed, well-nourished, no acute distress. Eyes: Pink conjunctiva, anicteric sclera. HEENT: Normocephalic, moist mucous membranes, clear oropharnyx. Lungs: Clear to auscultation bilaterally. Heart: Regular rate and rhythm. No rubs, murmurs, or gallops. Abdomen: Soft, nontender, nondistended. No organomegaly noted, normoactive bowel sounds. Musculoskeletal: No edema, cyanosis, or clubbing. Neuro: Alert, answering all questions appropriately. Cranial nerves grossly intact. Skin: No rashes or petechiae noted. Psych: Normal affect. Lymphatics: No cervical, calvicular, axillary or inguinal LAD.   LAB RESULTS:  Lab Results  Component Value Date   NA 134* 03/26/2016   K 3.5 03/26/2016   CL 103 03/26/2016   CO2 25 03/26/2016   GLUCOSE 89 03/26/2016   BUN 14 03/26/2016   CREATININE 1.08 03/26/2016   CALCIUM 7.9* 03/26/2016   PROT 6.5 03/25/2016   ALBUMIN 3.3* 03/25/2016   AST 19 03/25/2016   ALT 13* 03/25/2016   ALKPHOS 70 03/25/2016   BILITOT 0.8 03/25/2016   GFRNONAA >60 03/26/2016   GFRAA >60 03/26/2016    Lab Results  Component Value Date   WBC 7.6 03/26/2016   NEUTROABS 8.7* 03/25/2016   HGB 10.0* 03/26/2016   HCT 30.8* 03/26/2016   MCV 77.6* 03/26/2016   PLT 71* 03/26/2016   Lab Results  Component Value Date   CA199 287* 01/16/2016    STUDIES: Ct Head Wo Contrast  03/25/2016  CLINICAL DATA:  Dizziness, unwitnessed fall. No reported loss of  consciousness. EXAM: CT HEAD WITHOUT CONTRAST TECHNIQUE: Contiguous axial images were obtained from the base of the skull through the vertex without intravenous contrast. COMPARISON:  None. FINDINGS: Status post right cochlear implant. Bony calvarium appears intact. Mild diffuse cortical atrophy is noted. No mass effect or midline shift is noted. Ventricular size is within normal limits. There is no evidence of mass lesion, hemorrhage or acute infarction. IMPRESSION: Mild diffuse cortical atrophy. No acute intracranial abnormality seen. Electronically Signed   By: Marijo Conception, M.D.   On: 03/25/2016 17:19   Ct Abdomen Pelvis W Contrast  03/18/2016  CLINICAL DATA:  Right abdominal pain, pancreatic cancer, status post cholecystectomy, prior bladder cancer resection EXAM: CT ABDOMEN AND  PELVIS WITH CONTRAST TECHNIQUE: Multidetector CT imaging of the abdomen and pelvis was performed using the standard protocol following bolus administration of intravenous contrast. CONTRAST:  16mL ISOVUE-300 IOPAMIDOL (ISOVUE-300) INJECTION 61% COMPARISON:  PET-CT dated 01/23/2016 FINDINGS: Lower chest:  Lung bases are clear. Hepatobiliary: Liver is within normal limits. No suspicious hepatic lesions. Mild intrahepatic ductal dilatation. Mildly prominent common duct. Pancreas: Fullness of the pancreatic body, reflecting combination of the known cystic lesion with dilated main pancreatic duct, measuring 3.2 x 5.8 cm. This previously measured approximately 2.5 x 5.4 cm when measured in a similar fashion. This is suspicious for progression of the IPMN with adenocarcinoma. Additional 1.4 x 2.4 cm lesion along the anterior aspect of the pancreatic head/ uncinate process (series 2/image 30), FDG avid on prior PET, possibly reflecting pancreatic tumor versus a peripancreatic node. This is grossly unchanged from the prior study. Surrounding peripancreatic inflammatory changes. Spleen: Enlarged, measuring 16.3 cm in maximal craniocaudal  dimension. Adrenals/Urinary Tract: Adrenal glands are within normal limits. 1.7 cm cyst in the posterior right upper kidney (series 2/ image 25). No enhancing renal lesions. No hydronephrosis. Bladder is underdistended but unremarkable Stomach/Bowel: Stomach is notable for a small hiatal hernia. Again noted is mild wall thickening with inflammatory changes involving the distal gastric antrum/duodenal bulb (series 2/ image 23) and proximal duodenum (series 2/image 28). Colonic diverticulosis, without evidence of diverticulitis. Vascular/Lymphatic: Atherosclerotic calcifications of the abdominal aorta and branch vessels. Aorto bi-iliac stent. IVC filter. 17 mm short axis portacaval node (series 2/ image 25), FDG avid on prior PET. Possible peripancreatic node, as above. No suspicious pelvic lymphadenopathy. Reproductive: Prostate is grossly unremarkable. Other: Small volume pelvic ascites. Musculoskeletal: Degenerative changes of the visualized thoracolumbar spine. IMPRESSION: Suspected progression of the IPMN with adenocarcinoma in the pancreatic body. Additional small pancreatic tumor versus peripancreatic node along the anterior aspect of the pancreatic head/ uncinate process. Portacaval lymphadenopathy, suspicious for nodal metastasis. Superimposed peripancreatic inflammatory changes. Additional inflammatory changes involving the gastric antrum/ duodenal bulb and proximal duodenum, similar to prior studies. Additional stable ancillary findings as above. Electronically Signed   By: Julian Hy M.D.   On: 03/18/2016 12:32   Dg Abd Acute W/chest  03/25/2016  CLINICAL DATA:  Weakness and lightheadedness. Pancreatic and bladder cancer. Aortic aneurysm. EXAM: DG ABDOMEN ACUTE W/ 1V CHEST COMPARISON:  03/18/2016 CT FINDINGS: Mild cardiomegaly. Hiatal hernia. Scarring or subsegmental atelectasis in the left mid lung. Atherosclerotic aortic arch. No free intraperitoneal gas beneath the hemidiaphragms. IVC filter  noted. Aortoiliac stents. Thoracic and lumbar spondylosis. No dilated bowel. Single air fluid level in a nondilated loop of small bowel in the left abdomen. Sigmoid colon diverticula. Degenerative arthropathy of the hips, left greater than right. Vascular calcification projects along the left renal hilum. Splenomegaly. IMPRESSION: 1. No overt abnormality of the bowel gas pattern. There is a single air fluid level in a left-sided nondilated loop of small bowel, not necessarily abnormal. 2. Other imaging findings of potential clinical significance: Cardiomegaly. Atherosclerosis. Hiatal hernia. IVC filter and aorta iliac stents. Degenerative hip arthropathy. Diverticulosis. Electronically Signed   By: Van Clines M.D.   On: 03/25/2016 18:50    ASSESSMENT: Progressive adenocarcinoma the pancreas, nausea vomiting, fall.  PLAN:    1. Pancreatic cancer: CT scan results reviewed independently with mildly progressive disease, although this is unlikely to cause of his current symptoms. Previously it was agreed upon that patient is a poor surgical candidate and this would not be a good option to pursue. He does not  wish to pursue radiation therapy. Chemotherapy was discussed at length including oral Tarceva, but patient states he still does not want to pursue this option. He expressed understanding that his cancer will likely advance and possibly become metastatic. He said he would reconsider chemotherapy in the future if he were symptomatic. We also briefly discussed the option of comfort care and hospice in the future if necessary. No intervention is needed at this time.  2. 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 He is currently on 10 mg Coumadin daily lifelong. Goal INR is between 2 and 3. 3. CLL: White blood cell count is within normal limits, monitor. 4. Pain: Possibly related to progressive disease, continue current treatment. 5. Nausea  vomiting: With low-grade fevers, possibly viral gastroenteritis. Continue supportive therapy. 6. Disposition: Okay to discharge from an oncology standpoint. Patient should follow-up in the [place] in 2-3 weeks.   Pancreatic adenocarcinoma [facility])   Staging form: Pancreas, AJCC 7th Edition     Clinical stage from 01/10/2016: Stage IIB (T2, N1, M0) - Signed by Evlyn Kanner, NP on 01/28/2016   Lloyd Huger, MD   03/26/2016 2:01 PM

## 2016-03-26 NOTE — Care Management Note (Signed)
Case Management Note  Patient Details  Name: Kenneth Curry MRN: [id number] Date of Birth: [birth year]  Subjective/Objective:                 Patient admitted from home with fall likely due to dehydration.  Patient lives at home with his wife.  Patient states that his only medical equipment is a walking stick.  Patient obtains his medications at [facility] in [place].  Denies any issues obtaining his medications.  PT has recommended Home health PT.   Patient and family was offered Agency preference list.  [facility]. Heads up referral made to [facility] with [facility].    Action/Plan: RNCM to arrange home health at time of discharge.   Expected Discharge Date:                  Expected Discharge Plan:     In-House Referral:     Discharge planning Services     Post Acute Care Choice:    Choice offered to:     DME Arranged:    DME Agency:     HH Arranged:    [place] Agency:     Status of Service:     Medicare Important Message Given:    Date Medicare IM Given:    Medicare IM give by:    Date Additional Medicare IM Given:    Additional Medicare Important Message give by:     If discussed at [place] of Stay Meetings, dates discussed:    Additional Comments:  Beverly Sessions, RN 03/26/2016, 1:27 PM

## 2016-03-26 NOTE — Evaluation (Signed)
Physical Therapy Evaluation Patient Details Name: Kenneth Curry MRN: [id number] DOB: [birth year] Today's Date: 03/26/2016   History of Present Illness  Kenneth Curry is a 82 y.o. male who presents with Nausea and vomiting and a fall. Patient states that he had onset of some abdominal pain today, which is not a new pain, but today was associated with nausea vomiting and then a fall in his yard. Patient was unable to get up after the fall, but denies any loss of consciousness or reported seizure activity, including denying any loss of bowel or bladder continence. On arrival here to the ED he was noted to be febrile. Workup in the ED was otherwise largely unremarkable. However, the patient does have a history of pancreatic cancer, and in conjunction with his fever hospitalists were called for admission. Current fall was the only fall in the last 12 months.  Clinical Impression  Pt demonstrates functional bed mobility, transfers, and ambulation for safe return home. He is somewhat deconditioned currently from acute illness and not back to baseline but significantly improved. Pt able to ambulate full lap around RN station. He presents with some deficits in balance and deconditioning and may benefit from [facility] PT pending status at discharge. Patient and family considering. Pt will benefit from skilled PT services to address deficits in strength, balance, and mobility in order to return to full function at home.     Follow Up Recommendations Home health PT;Other (comment) (Pt may decline prior to discharge, still considering)    Equipment Recommendations  Rolling walker with 5" wheels    Recommendations for Other Services       Precautions / Restrictions Precautions Precautions: Fall Restrictions Weight Bearing Restrictions: No      Mobility  Bed Mobility Overal bed mobility: Modified Independent             General bed mobility comments: HOB elevated and use of bed rails. Good  speed/sequencing  Transfers Overall transfer level: Needs assistance Equipment used: Rolling walker (2 wheeled) Transfers: Sit to/from Stand Sit to Stand: Min guard         General transfer comment: Pt demonstrates good speed and sequencing with transfer. Fair stability in wide stance.  Ambulation/Gait Ambulation/Gait assistance: Min guard Ambulation Distance (Feet): 220 Feet Assistive device: Rolling walker (2 wheeled) Gait Pattern/deviations: WFL(Within Functional Limits) Gait velocity: Decreased Gait velocity interpretation: <1.8 ft/sec, indicative of risk for recurrent falls General Gait Details: Good step length and gait speed. Fatigue and vitals monitored throughout ambulation distance. Pt provided standing rest breaks but with minimal fatigue. Vitals remain WNL. Gait speed changes performed with patient  Stairs            Wheelchair Mobility    Modified Rankin (Stroke Patients Only)       Balance Overall balance assessment: Needs assistance Sitting-balance support: No upper extremity supported Sitting balance-Leahy Scale: Good     Standing balance support: No upper extremity supported Standing balance-Leahy Scale: Fair Standing balance comment: Pt able to maintain static standing balance in wide stance. Requires assist to get into narrow stance and increased sway with eventual LOB in narrow balance. Single leg stance <2 seconds bilterally                             Pertinent Vitals/Pain Pain Assessment: No/denies pain (Premedicated for R side abdominal pain)    Home Living Family/patient expects to be discharged to:: Private residence Living Arrangements: Spouse/significant other  Available Help at Discharge: Family Type of Home: House Home Access: Stairs to enter Entrance Stairs-Rails: None Entrance Stairs-Number of Steps: 2 Home Layout: One level Home Equipment: Cane - single point;Shower seat - built in;Grab bars - tub/shower (no  walker, no wc, no BSC, no commode grab bar)      Prior Function Level of Independence: Needs assistance   Gait / Transfers Assistance Needed: Limited community ambulator with prn use of spc. Drives.   ADL's / Homemaking Assistance Needed: Independent        Hand Dominance   Dominant Hand: Right    Extremity/Trunk Assessment   Upper Extremity Assessment: Overall WFL for tasks assessed           Lower Extremity Assessment: Overall WFL for tasks assessed         Communication   Communication: HOH;Other (comment) (Cochlear implants)  Cognition Arousal/Alertness: Awake/alert Behavior During Therapy: WFL for tasks assessed/performed Overall Cognitive Status: Within Functional Limits for tasks assessed                      General Comments      Exercises        Assessment/Plan    PT Assessment Patient needs continued PT services  PT Diagnosis Generalized weakness;Abnormality of gait   PT Problem List Decreased strength;Decreased activity tolerance;Decreased balance;Decreased mobility;Decreased knowledge of use of DME;Decreased safety awareness  PT Treatment Interventions DME instruction;Gait training;Stair training;Therapeutic activities;Therapeutic exercise;Balance training;Neuromuscular re-education;Patient/family education   PT Goals (Current goals can be found in the Care Plan section) Acute Rehab PT Goals Patient Stated Goal: Return to prior level of function at home PT Goal Formulation: With patient/family Time For Goal Achievement: 04/09/16 Potential to Achieve Goals: Good    Frequency Min 2X/week   Barriers to discharge        Co-evaluation               End of Session Equipment Utilized During Treatment: Gait belt Activity Tolerance: Patient tolerated treatment well Patient left: in bed;with call bell/phone within reach;with bed alarm set;with family/visitor present Nurse Communication: Mobility status    Functional Assessment  Tool Used: clinical judgement Functional Limitation: Mobility: Walking and moving around Mobility: Walking and Moving Around Current Status [id number]): At least 20 percent but less than 40 percent impaired, limited or restricted Mobility: Walking and Moving Around Goal Status [phone]): At least 1 percent but less than 20 percent impaired, limited or restricted    Time: 0915-0940 PT Time Calculation (min) (ACUTE ONLY): 25 min   Charges:   PT Evaluation $PT Eval Low Complexity: 1 Procedure PT Treatments $Gait Training: 8-22 mins   PT G Codes:   PT G-Codes **NOT FOR INPATIENT CLASS** Functional Assessment Tool Used: clinical judgement Functional Limitation: Mobility: Walking and moving around Mobility: Walking and Moving Around Current Status [id number]): At least 20 percent but less than 40 percent impaired, limited or restricted Mobility: Walking and Moving Around Goal Status [phone]): At least 1 percent but less than 20 percent impaired, limited or restricted   Phillips Grout PT, DPT   Huprich,Jason 03/26/2016, 11:59 AM

## 2016-03-27 DIAGNOSIS — W19XXXA Unspecified fall, initial encounter: Secondary | ICD-10-CM | POA: Diagnosis not present

## 2016-03-27 DIAGNOSIS — R531 Weakness: Secondary | ICD-10-CM | POA: Diagnosis not present

## 2016-03-27 DIAGNOSIS — I10 Essential (primary) hypertension: Secondary | ICD-10-CM | POA: Diagnosis not present

## 2016-03-27 DIAGNOSIS — N179 Acute kidney failure, unspecified: Secondary | ICD-10-CM | POA: Diagnosis not present

## 2016-03-27 LAB — CBC
HCT: 31.2 % — ABNORMAL LOW (ref 40.0–52.0)
Hemoglobin: 10.3 g/dL — ABNORMAL LOW (ref 13.0–18.0)
MCH: 25.9 pg — AB (ref 26.0–34.0)
MCHC: 32.9 g/dL (ref 32.0–36.0)
MCV: 78.8 fL — AB (ref 80.0–100.0)
PLATELETS: 63 10*3/uL — AB (ref 150–440)
RBC: 3.96 MIL/uL — AB (ref 4.40–5.90)
RDW: 15.8 % — ABNORMAL HIGH (ref 11.5–14.5)
WBC: 6.1 10*3/uL (ref 3.8–10.6)

## 2016-03-27 LAB — PROTIME-INR
INR: 2.56
PROTHROMBIN TIME: 27.2 s — AB (ref 11.4–15.0)

## 2016-03-27 LAB — GLUCOSE, CAPILLARY
Glucose-Capillary: 163 mg/dL — ABNORMAL HIGH (ref 65–99)
Glucose-Capillary: 326 mg/dL — ABNORMAL HIGH (ref 65–99)

## 2016-03-27 MED ORDER — WARFARIN SODIUM 5 MG PO TABS: 10.0000 mg | ORAL_TABLET | Freq: Every day | ORAL | Status: DC

## 2016-03-27 MED ORDER — WARFARIN SODIUM 10 MG PO TABS: ORAL_TABLET | ORAL | Status: DC

## 2016-03-27 MED ORDER — WARFARIN SODIUM 5 MG PO TABS: 10.0000 mg | ORAL_TABLET | ORAL | Status: DC

## 2016-03-27 MED ORDER — WARFARIN SODIUM 7.5 MG PO TABS: ORAL_TABLET | ORAL | Status: DC

## 2016-03-27 MED ORDER — WARFARIN - PHARMACIST DOSING INPATIENT: Freq: Every day | Status: DC

## 2016-03-27 MED ORDER — WARFARIN SODIUM 10 MG PO TABS: 10.0000 mg | ORAL_TABLET | Freq: Once | ORAL | Status: DC

## 2016-03-27 MED ORDER — WARFARIN SODIUM 7.5 MG PO TABS: 7.5000 mg | ORAL_TABLET | Freq: Every day | ORAL | Status: DC

## 2016-03-27 MED ORDER — WARFARIN SODIUM 5 MG PO TABS: 7.5000 mg | ORAL_TABLET | ORAL | Status: DC

## 2016-03-27 NOTE — Progress Notes (Signed)
ANTICOAGULATION CONSULT NOTE - Initial Consult  Pharmacy Consult for warfarin dosing Indication: VTE prophylaxis  Allergies  Allergen Reactions  . Hydrocodone-Acetaminophen Other (See Comments)    Reaction:  Unknown   . Metformin Diarrhea  . Atorvastatin Rash    Patient Measurements: Height: 5\' 8"  (172.7 cm) Weight: 202 lb 8 oz (91.853 kg) IBW/kg (Calculated) : 68.4 Heparin Dosing Weight: n/a  Vital Signs: Temp: 97.8 F (36.6 C) (04/20 0537) Temp Source: Oral (04/20 0537) BP: 111/55 mmHg (04/20 0537) Pulse Rate: 54 (04/20 0537)  Labs:  Recent Labs  03/25/16 1542 03/26/16 0538 03/27/16 0344  HGB 11.1* 10.0* 10.3*  HCT 34.0* 30.8* 31.2*  PLT 79* 71* 63*  LABPROT 31.2* 32.4* 27.2*  INR 3.08 3.24 2.56  CREATININE 1.05 1.08  --   CKTOTAL 35*  --   --   TROPONINI <0.03  --   --     Estimated Creatinine Clearance: 60 mL/min (by C-G formula based on Cr of 1.08).   Medical History: Past Medical History  Diagnosis Date  . Abdominal aortic aneurysm ([place])   . Pulmonary nodules   . Thrombocytopenia ([place])   . Presence of IVC filter   . Elevated cholesterol   . Diabetes mellitus, type 2 ([place])   . Cellulitis   . History of primary bladder cancer   . Hematuria   . Urinary frequency   . Phimosis   . Pancreatic adenocarcinoma ([place]) 01/16/2016    Medications:    Assessment: INR on admission 3.08   Goal of Therapy:  INR 2-3    Plan:  Home dose of warfarin is 10 mg daily per med rec. INR is supratherapeutic on admission. Will reorder INR in AM and restart warfarin if in therapeutic range.  4/19 AM INR 3.24. No warfarin dose today. Recheck INR tomorrow AM.  4/20 AM INR 2.56. Reordering home dose of 10 mg daily. Recheck INR in AM.  Makinsley Schiavi S 03/27/2016,6:27 AM

## 2016-03-27 NOTE — Care Management (Signed)
Discharge to home today per Dr. Posey Pronto. Will be receiving home health services through [facility]. [facility] [place] will be provided by [facility]. Family/friend will transport. Shelbie Ammons RN MSN CCM Care Management [phone]

## 2016-03-27 NOTE — Progress Notes (Signed)
ANTICOAGULATION CONSULT NOTE - Follow up [place] for warfarin dosing Indication: VTE prophylaxis hx DVT/PE  Allergies  Allergen Reactions  . Hydrocodone-Acetaminophen Other (See Comments)    Reaction:  Unknown   . Metformin Diarrhea  . Atorvastatin Rash    Patient Measurements: Height: 5\' 8"  (172.7 cm) Weight: 202 lb 8 oz (91.853 kg) IBW/kg (Calculated) : 68.4  Vital Signs: Temp: 97.8 F (36.6 C) (04/20 0537) Temp Source: Oral (04/20 0537) BP: 111/55 mmHg (04/20 0537) Pulse Rate: 54 (04/20 0537)  Labs:  Recent Labs  03/25/16 1542 03/26/16 0538 03/27/16 0344  HGB 11.1* 10.0* 10.3*  HCT 34.0* 30.8* 31.2*  PLT 79* 71* 63*  LABPROT 31.2* 32.4* 27.2*  INR 3.08 3.24 2.56  CREATININE 1.05 1.08  --   CKTOTAL 35*  --   --   TROPONINI <0.03  --   --     Estimated Creatinine Clearance: 60 mL/min (by C-G formula based on Cr of 1.08).  Assessment: 82 yo male with hx of DVT/PE. INR supratherapeutic on admission. Home regimen noted to be warfarin 10 mg daily per med rec.   4/18 INR 3.08 - held  4/19 INR 3.24 - held 4/20 INR 2.56   Goal of Therapy:  INR 2-3    Plan:  Recommending warfarin 10 mg daily except 7.5 mg on Tues/Sat. Pt should receive 10 mg dose tonight. Recommend INR follow up by Monday at the latest.  Hgb stable from yesterday.   Rocky Morel 03/27/2016,8:16 AM

## 2016-03-27 NOTE — Progress Notes (Signed)
Discharge instructions given and went over with patient and wife at bedside. All questions answered. Patient discharged home with wife via wheelchair by nursing staff. Forestine Macho S, RN  

## 2016-03-27 NOTE — Discharge Summary (Addendum)
[place] [facility] [place] NAME: Kenneth Curry    MR#:  [id number]  DATE OF BIRTH:  October 15, 1933  DATE OF ADMISSION:  03/25/2016 ADMITTING PHYSICIAN: Lance Coon, MD  DATE OF DISCHARGE: 03/27/16  PRIMARY CARE PHYSICIAN: Ezequiel Kayser, MD    ADMISSION DIAGNOSIS:  Abdominal pain, right lateral [R10.9] Malignant neoplasm of pancreas, unspecified location of malignancy ([place]) [C25.9] Fever, unspecified fever cause [R50.9] Nausea and vomiting, vomiting of unspecified type [R11.2]  DISCHARGE DIAGNOSIS:  Intractable nausea/vomiting due to viral GE-resolved Pancreatic adenocarinoma Fall with superficial scalp abrasion  SECONDARY DIAGNOSIS:   Past Medical History  Diagnosis Date  . Abdominal aortic aneurysm ([place])   . Pulmonary nodules   . Thrombocytopenia ([place])   . Presence of IVC filter   . Elevated cholesterol   . Diabetes mellitus, type 2 ([place])   . Cellulitis   . History of primary bladder cancer   . Hematuria   . Urinary frequency   . Phimosis   . Pancreatic adenocarcinoma [facility]) 01/16/2016    HOSPITAL COURSE:  Kenneth Curry is a 82 y.o. male who presents with Nausea and vomiting and a fall. Patient states that he had onset of some abdominal pain today, which is not a new pain, but today was associated with nausea vomiting and then a fall in his yard.  *Fall - suspected due to dehydration. Patient sustained a couple had abrasions, but no further injury and CT head was negative for acute trauma. -PT recommends HHPT  * Pancreatic adenocarcinoma ([place]) - unclear if this is controlling to his abdominal pain and/or nausea and vomiting. Did not feel the need for imaging at this point, rather we will observe him and monitor  * Nausea and vomiting - improved somewhat antiemetics in the ED, we'll keep these available when necessary  * h/o DVT on coumadin INR 3.24  *Controlled type 2 diabetes mellitus without complication (HCC) - sliding  scale insulin with corresponding glucose checks and carb modified diet  * GERD (gastroesophageal reflux disease) - home dose PPI  * HLD (hyperlipidemia) - home meds  *t hypothyroidism - home dose thyroid replacement  *1/2 BC (anaerobic) positive for strep viridans Pt asymptomatic   Overall better. Will d/c home and pt agreeable CONSULTS OBTAINED:  Treatment Team:  Lloyd Huger, MD  DRUG ALLERGIES:   Allergies  Allergen Reactions  . Hydrocodone-Acetaminophen Other (See Comments)    Reaction:  Unknown   . Metformin Diarrhea  . Atorvastatin Rash    DISCHARGE MEDICATIONS:   Current Discharge Medication List    START taking these medications   Details  !! warfarin (COUMADIN) 10 MG tablet Take 1 tab on sun-mon-wed-thur-fri at 1800 Qty: 30 tablet, Refills: 2    !! warfarin (COUMADIN) 7.5 MG tablet Take 1 tab on tues and saturday Qty: 30 tablet, Refills: 1     !! - Potential duplicate medications found. Please discuss with provider.    CONTINUE these medications which have NOT CHANGED   Details  Calcium Carbonate-Vitamin D (CALCIUM 600+D) 600-400 MG-UNIT tablet Take 1 tablet by mouth 2 (two) times daily.    glimepiride (AMARYL) 4 MG tablet Take 4 mg by mouth 2 (two) times daily before a meal.     insulin glargine (LANTUS) 100 unit/mL SOPN Inject 32 Units into the skin at bedtime.    levothyroxine (SYNTHROID, LEVOTHROID) 150 MCG tablet Take 150 mcg by mouth daily before breakfast.     lovastatin (MEVACOR) 20 MG tablet  Take 20 mg by mouth at bedtime.     omeprazole (PRILOSEC) 20 MG capsule Take 1 capsule (20 mg total) by mouth 2 (two) times daily before a meal. Qty: 30 capsule, Refills: 5    ondansetron (ZOFRAN) 4 MG tablet Take 1 tablet (4 mg total) by mouth every 8 (eight) hours as needed for nausea or vomiting. Qty: 30 tablet, Refills: 1    oxyCODONE (OXY IR/ROXICODONE) 5 MG immediate release tablet Take 1 tablet (5 mg total) by mouth every 4 (four) hours as  needed for severe pain. Qty: 60 tablet, Refills: 0   Associated Diagnoses: Acute pancreatitis, unspecified pancreatitis type    promethazine (PHENERGAN) 25 MG tablet Take 25 mg by mouth every 6 (six) hours as needed for nausea or vomiting.     !! warfarin (COUMADIN) 10 MG tablet Take 10 mg by mouth every evening.    Associated Diagnoses: Pancreatic adenocarcinoma ([place])     !! - Potential duplicate medications found. Please discuss with provider.      If you experience worsening of your admission symptoms, develop shortness of breath, life threatening emergency, suicidal or homicidal thoughts you must seek medical attention immediately by calling 911 or calling your MD immediately  if symptoms less severe.  You Must read complete instructions/literature along with all the possible adverse reactions/side effects for all the Medicines you take and that have been prescribed to you. Take any new Medicines after you have completely understood and accept all the possible adverse reactions/side effects.   Please note  You were cared for by a hospitalist during your hospital stay. If you have any questions about your discharge medications or the care you received while you were in the hospital after you are discharged, you can call the unit and asked to speak with the hospitalist on call if the hospitalist that took care of you is not available. Once you are discharged, your primary care physician will handle any further medical issues. Please note that NO REFILLS for any discharge medications will be authorized once you are discharged, as it is imperative that you return to your primary care physician (or establish a relationship with a primary care physician if you do not have one) for your aftercare needs so that they can reassess your need for medications and monitor your lab values. Today   SUBJECTIVE   Eating good. No complaints. Wants to go home  VITAL SIGNS:  Blood pressure 111/55, pulse  54, temperature 97.8 F (36.6 C), temperature source Oral, resp. rate 20, height 5\' 8"  (1.727 m), weight 91.853 kg (202 lb 8 oz), SpO2 92 %.  I/O:    Intake/Output Summary (Last 24 hours) at 03/27/16 0830 Last data filed at 03/27/16 0808  Gross per 24 hour  Intake    845 ml  Output   1250 ml  Net   -405 ml    PHYSICAL EXAMINATION:  GENERAL:  82 y.o.-year-old patient lying in the bed with no acute distress.  EYES: Pupils equal, round, reactive to light and accommodation. No scleral icterus. Extraocular muscles intact.  HEENT: Head atraumatic, normocephalic. Oropharynx and nasopharynx clear. Minimal scalp abrasion NECK:  Supple, no jugular venous distention. No thyroid enlargement, no tenderness.  LUNGS: Normal breath sounds bilaterally, no wheezing, rales,rhonchi or crepitation. No use of accessory muscles of respiration.  CARDIOVASCULAR: S1, S2 normal. No murmurs, rubs, or gallops.  ABDOMEN: Soft, non-tender, non-distended. Bowel sounds present. No organomegaly or mass.  EXTREMITIES: No pedal edema, cyanosis, or clubbing.  NEUROLOGIC: Cranial nerves II through XII are intact. Muscle strength 5/5 in all extremities. Sensation intact. Gait not checked.  PSYCHIATRIC:  patient is alert and oriented x 3.  SKIN: No obvious rash, lesion, or ulcer.   DATA REVIEW:   CBC   Recent Labs Lab 03/27/16 0344  WBC 6.1  HGB 10.3*  HCT 31.2*  PLT 63*    Chemistries   Recent Labs Lab 03/25/16 1542 03/26/16 0538  NA 137 134*  K 3.9 3.5  CL 104 103  CO2 22 25  GLUCOSE 277* 89  BUN 17 14  CREATININE 1.05 1.08  CALCIUM 9.1 7.9*  AST 19  --   ALT 13*  --   ALKPHOS 70  --   BILITOT 0.8  --     Microbiology Results   Recent Results (from the past 240 hour(s))  Culture, blood (routine x 2)     Status: None (Preliminary result)   Collection Time: 03/25/16  6:25 PM  Result Value Ref Range Status   Specimen Description BLOOD LEFT ASSIST CONTROL  Final   Special Requests   Final     BOTTLES DRAWN AEROBIC AND ANAEROBIC [place] AERO 8CC ANA   Culture NO GROWTH < 24 HOURS  Final   Report Status PENDING  Incomplete  Culture, blood (routine x 2)     Status: Abnormal (Preliminary result)   Collection Time: 03/25/16  6:30 PM  Result Value Ref Range Status   Specimen Description BLOOD RIGHT ASSIST CONTROL  Final   Special Requests BOTTLES DRAWN AEROBIC AND ANAEROBIC 8CC  Final   Culture  Setup Time   Final    GRAM POSITIVE COCCI ANAEROBIC BOTTLE ONLY CRITICAL RESULT CALLED TO, READ BACK BY AND VERIFIED WITH: MATT MCBANE AT 2148 03/26/2016 BY TFK CONFIRMED BY MLZ    Culture (A)  Final    VIRIDANS STREPTOCOCCUS ANAEROBIC BOTTLE ONLY THE SIGNIFICANCE OF ISOLATING THIS ORGANISM FROM A SINGLE VENIPUNCTURE CANNOT BE PREDICTED WITHOUT FURTHER CLINICAL AND CULTURE CORRELATION. SUSCEPTIBILITIES AVAILABLE ONLY ON REQUEST.    Report Status PENDING  Incomplete  Blood Culture ID Panel (Reflexed)     Status: Abnormal   Collection Time: 03/25/16  6:30 PM  Result Value Ref Range Status   Enterococcus species NOT DETECTED NOT DETECTED Final   Vancomycin resistance NOT DETECTED NOT DETECTED Final   Listeria monocytogenes NOT DETECTED NOT DETECTED Final   Staphylococcus species NOT DETECTED NOT DETECTED Final   Staphylococcus aureus NOT DETECTED NOT DETECTED Final   Methicillin resistance NOT DETECTED NOT DETECTED Final   Streptococcus species DETECTED (A) NOT DETECTED Final    Comment: CRITICAL RESULT CALLED TO, READ BACK BY AND VERIFIED WITH: MATT MCBANE AT 2148 03/26/2016 BY TFK    Streptococcus agalactiae NOT DETECTED NOT DETECTED Final   Streptococcus pneumoniae NOT DETECTED NOT DETECTED Final   Streptococcus pyogenes NOT DETECTED NOT DETECTED Final   Acinetobacter baumannii NOT DETECTED NOT DETECTED Final   Enterobacteriaceae species NOT DETECTED NOT DETECTED Final   Enterobacter cloacae complex NOT DETECTED NOT DETECTED Final   Escherichia coli NOT DETECTED NOT DETECTED Final    Klebsiella oxytoca NOT DETECTED NOT DETECTED Final   Klebsiella pneumoniae NOT DETECTED NOT DETECTED Final   Proteus species NOT DETECTED NOT DETECTED Final   Serratia marcescens NOT DETECTED NOT DETECTED Final   Carbapenem resistance NOT DETECTED NOT DETECTED Final   Haemophilus influenzae NOT DETECTED NOT DETECTED Final   Neisseria meningitidis NOT DETECTED NOT DETECTED Final   Pseudomonas aeruginosa NOT  DETECTED NOT DETECTED Final   Candida albicans NOT DETECTED NOT DETECTED Final   Candida glabrata NOT DETECTED NOT DETECTED Final   Candida krusei NOT DETECTED NOT DETECTED Final   Candida parapsilosis NOT DETECTED NOT DETECTED Final   Candida tropicalis NOT DETECTED NOT DETECTED Final    RADIOLOGY:  Ct Head Wo Contrast  03/25/2016  CLINICAL DATA:  Dizziness, unwitnessed fall. No reported loss of consciousness. EXAM: CT HEAD WITHOUT CONTRAST TECHNIQUE: Contiguous axial images were obtained from the base of the skull through the vertex without intravenous contrast. COMPARISON:  None. FINDINGS: Status post right cochlear implant. Bony calvarium appears intact. Mild diffuse cortical atrophy is noted. No mass effect or midline shift is noted. Ventricular size is within normal limits. There is no evidence of mass lesion, hemorrhage or acute infarction. IMPRESSION: Mild diffuse cortical atrophy. No acute intracranial abnormality seen. Electronically Signed   By: Marijo Conception, M.D.   On: 03/25/2016 17:19   Dg Abd Acute W/chest  03/25/2016  CLINICAL DATA:  Weakness and lightheadedness. Pancreatic and bladder cancer. Aortic aneurysm. EXAM: DG ABDOMEN ACUTE W/ 1V CHEST COMPARISON:  03/18/2016 CT FINDINGS: Mild cardiomegaly. Hiatal hernia. Scarring or subsegmental atelectasis in the left mid lung. Atherosclerotic aortic arch. No free intraperitoneal gas beneath the hemidiaphragms. IVC filter noted. Aortoiliac stents. Thoracic and lumbar spondylosis. No dilated bowel. Single air fluid level in a  nondilated loop of small bowel in the left abdomen. Sigmoid colon diverticula. Degenerative arthropathy of the hips, left greater than right. Vascular calcification projects along the left renal hilum. Splenomegaly. IMPRESSION: 1. No overt abnormality of the bowel gas pattern. There is a single air fluid level in a left-sided nondilated loop of small bowel, not necessarily abnormal. 2. Other imaging findings of potential clinical significance: Cardiomegaly. Atherosclerosis. Hiatal hernia. IVC filter and aorta iliac stents. Degenerative hip arthropathy. Diverticulosis. Electronically Signed   By: Van Clines M.D.   On: 03/25/2016 18:50     Management plans discussed with the patient, family and they are in agreement.  CODE STATUS:     Code Status Orders        Start     Ordered   03/26/16 0026  Full code   Continuous     03/26/16 0025    Code Status History    Date Active Date Inactive Code Status Order ID Comments User Context   01/17/2016  5:51 PM 01/19/2016  6:07 PM Full Code [id number]  Henreitta Leber, MD Inpatient    Advance Directive Documentation        Most Recent Value   Type of Advance Directive  Healthcare Power of [place], Living will   Pre-existing out of facility DNR order (yellow form or pink MOST form)     "MOST" Form in Place?        TOTAL TIME TAKING CARE OF THIS PATIENT: 40 minutes.    Rayce Brahmbhatt M.D on 03/27/2016 at 8:30 AM  Between 7am to 6pm - Pager - [phone] After 6pm go to [email or web address] - password [email or web address] [facility]  Office  [phone]  CC: Primary care physician; Ezequiel Kayser, MD

## 2016-03-27 NOTE — Discharge Instructions (Signed)
Get PT/INR checked on Monday April 24th

## 2016-03-30 LAB — CULTURE, BLOOD (ROUTINE X 2): CULTURE: NO GROWTH

## 2016-03-31 NOTE — Progress Notes (Signed)
[facility]  Patient Status: closed  Patient declined services 03/28/16  If patient discharges after hours, please call [phone].   Kenneth Curry 03/31/2016, 9:17 AM

## 2016-04-03 ENCOUNTER — Other Ambulatory Visit: Payer: Commercial Managed Care - HMO

## 2016-04-03 ENCOUNTER — Ambulatory Visit: Payer: Commercial Managed Care - HMO | Admitting: Oncology

## 2016-04-04 ENCOUNTER — Ambulatory Visit: Payer: Commercial Managed Care - HMO | Admitting: Oncology

## 2016-04-04 ENCOUNTER — Other Ambulatory Visit: Payer: Commercial Managed Care - HMO

## 2016-04-09 ENCOUNTER — Telehealth: Payer: Self-pay | Admitting: *Deleted

## 2016-04-09 MED ORDER — ONDANSETRON HCL 4 MG PO TABS
4.0000 mg | ORAL_TABLET | Freq: Three times a day (TID) | ORAL | Status: DC | PRN
Start: 2016-04-09 — End: 2016-05-16

## 2016-04-09 NOTE — Telephone Encounter (Signed)
Refilled ondansetron.

## 2016-04-11 ENCOUNTER — Telehealth: Payer: Self-pay | Admitting: *Deleted

## 2016-04-11 ENCOUNTER — Inpatient Hospital Stay (HOSPITAL_BASED_OUTPATIENT_CLINIC_OR_DEPARTMENT_OTHER): Payer: Commercial Managed Care - HMO | Admitting: Oncology

## 2016-04-11 ENCOUNTER — Inpatient Hospital Stay: Payer: Commercial Managed Care - HMO | Attending: Hematology and Oncology

## 2016-04-11 VITALS — BP 103/69 | HR 79 | Temp 98.3°F | Resp 16 | Wt 203.0 lb

## 2016-04-11 DIAGNOSIS — Z8051 Family history of malignant neoplasm of kidney: Secondary | ICD-10-CM

## 2016-04-11 DIAGNOSIS — Z808 Family history of malignant neoplasm of other organs or systems: Secondary | ICD-10-CM | POA: Insufficient documentation

## 2016-04-11 DIAGNOSIS — R110 Nausea: Secondary | ICD-10-CM | POA: Insufficient documentation

## 2016-04-11 DIAGNOSIS — I714 Abdominal aortic aneurysm, without rupture: Secondary | ICD-10-CM | POA: Insufficient documentation

## 2016-04-11 DIAGNOSIS — Z800 Family history of malignant neoplasm of digestive organs: Secondary | ICD-10-CM

## 2016-04-11 DIAGNOSIS — E119 Type 2 diabetes mellitus without complications: Secondary | ICD-10-CM | POA: Diagnosis not present

## 2016-04-11 DIAGNOSIS — Z801 Family history of malignant neoplasm of trachea, bronchus and lung: Secondary | ICD-10-CM

## 2016-04-11 DIAGNOSIS — K859 Acute pancreatitis, unspecified: Secondary | ICD-10-CM

## 2016-04-11 DIAGNOSIS — R630 Anorexia: Secondary | ICD-10-CM

## 2016-04-11 DIAGNOSIS — Z7901 Long term (current) use of anticoagulants: Secondary | ICD-10-CM | POA: Insufficient documentation

## 2016-04-11 DIAGNOSIS — C9110 Chronic lymphocytic leukemia of B-cell type not having achieved remission: Secondary | ICD-10-CM | POA: Diagnosis not present

## 2016-04-11 DIAGNOSIS — Z79899 Other long term (current) drug therapy: Secondary | ICD-10-CM | POA: Insufficient documentation

## 2016-04-11 DIAGNOSIS — Z87891 Personal history of nicotine dependence: Secondary | ICD-10-CM

## 2016-04-11 DIAGNOSIS — K5900 Constipation, unspecified: Secondary | ICD-10-CM

## 2016-04-11 DIAGNOSIS — C259 Malignant neoplasm of pancreas, unspecified: Secondary | ICD-10-CM | POA: Diagnosis not present

## 2016-04-11 DIAGNOSIS — E7800 Pure hypercholesterolemia, unspecified: Secondary | ICD-10-CM | POA: Insufficient documentation

## 2016-04-11 DIAGNOSIS — Z794 Long term (current) use of insulin: Secondary | ICD-10-CM | POA: Diagnosis not present

## 2016-04-11 DIAGNOSIS — D696 Thrombocytopenia, unspecified: Secondary | ICD-10-CM

## 2016-04-11 DIAGNOSIS — R109 Unspecified abdominal pain: Secondary | ICD-10-CM

## 2016-04-11 DIAGNOSIS — Z86718 Personal history of other venous thrombosis and embolism: Secondary | ICD-10-CM | POA: Insufficient documentation

## 2016-04-11 DIAGNOSIS — R918 Other nonspecific abnormal finding of lung field: Secondary | ICD-10-CM | POA: Insufficient documentation

## 2016-04-11 DIAGNOSIS — Z8551 Personal history of malignant neoplasm of bladder: Secondary | ICD-10-CM | POA: Insufficient documentation

## 2016-04-11 LAB — COMPREHENSIVE METABOLIC PANEL
ALK PHOS: 115 U/L (ref 38–126)
ALT: 25 U/L (ref 17–63)
AST: 28 U/L (ref 15–41)
Albumin: 3.2 g/dL — ABNORMAL LOW (ref 3.5–5.0)
Anion gap: 8 (ref 5–15)
BILIRUBIN TOTAL: 1.7 mg/dL — AB (ref 0.3–1.2)
BUN: 21 mg/dL — AB (ref 6–20)
CALCIUM: 8.8 mg/dL — AB (ref 8.9–10.3)
CO2: 25 mmol/L (ref 22–32)
CREATININE: 1.2 mg/dL (ref 0.61–1.24)
Chloride: 100 mmol/L — ABNORMAL LOW (ref 101–111)
GFR calc Af Amer: >60 mL/min (ref >60)
GFR, EST NON AFRICAN AMERICAN: 55 mL/min — AB (ref >60)
Glucose, Bld: 220 mg/dL — ABNORMAL HIGH (ref 65–99)
Potassium: 4.5 mmol/L (ref 3.5–5.1)
Sodium: 133 mmol/L — ABNORMAL LOW (ref 135–145)
TOTAL PROTEIN: 6.7 g/dL (ref 6.5–8.1)

## 2016-04-11 LAB — CBC WITH DIFFERENTIAL/PLATELET
BASOS ABS: 0 10*3/uL (ref 0–0.1)
Basophils Relative: 0 %
Eosinophils Absolute: 0.1 10*3/uL (ref 0–0.7)
Eosinophils Relative: 1 %
HEMATOCRIT: 34.9 % — AB (ref 40.0–52.0)
Hemoglobin: 11.2 g/dL — ABNORMAL LOW (ref 13.0–18.0)
LYMPHS ABS: 2.4 10*3/uL (ref 1.0–3.6)
MCH: 25.1 pg — ABNORMAL LOW (ref 26.0–34.0)
MCHC: 32 g/dL (ref 32.0–36.0)
MCV: 78.5 fL — AB (ref 80.0–100.0)
Monocytes Absolute: 0.7 10*3/uL (ref 0.2–1.0)
Monocytes Relative: 7 %
NEUTROS ABS: 6.5 10*3/uL (ref 1.4–6.5)
Neutrophils Relative %: 67 %
Platelets: 68 10*3/uL — ABNORMAL LOW (ref 150–440)
RBC: 4.45 MIL/uL (ref 4.40–5.90)
RDW: 17.4 % — ABNORMAL HIGH (ref 11.5–14.5)
WBC: 9.7 10*3/uL (ref 3.8–10.6)

## 2016-04-11 LAB — PROTIME-INR
INR: 4.46 — AB
Prothrombin Time: 41.2 seconds — ABNORMAL HIGH (ref 11.4–15.0)

## 2016-04-11 MED ORDER — FENTANYL 25 MCG/HR TD PT72: 25.0000 ug | MEDICATED_PATCH | TRANSDERMAL | Status: DC

## 2016-04-11 MED ORDER — OXYCODONE HCL 5 MG PO TABS: 5.0000 mg | ORAL_TABLET | ORAL | Status: DC | PRN

## 2016-04-11 NOTE — Progress Notes (Signed)
[place]  [phone]  [phone]     Kenneth Curry DOB: Aug 14, 1933  MR#: [id number]  DSK#:[id number]  Patient Care Team: Ezequiel Kayser, MD as PCP - General (Internal Medicine) Clent Jacks, RN as Registered Nurse  CHIEF COMPLAINT:  Chief Complaint  Patient presents with  . Pancreatic Cancer    INTERVAL HISTORY:   Patient returns to clinic today for routine evaluation in hospital follow-up. He continues to have persistent right-sided flank pain of approximately 6 out of 10. He also has occasional nausea as well as constipation. He has a poor appetite. He denies any other pain. He has no neurologic complaints. He denies any recent fevers. He has no chest pain or shortness of breath. He has no urinary complaints. Patient otherwise feels well and offers no further specific complaints.   REVIEW OF SYSTEMS:   Review of Systems  Constitutional: Negative.  Negative for fever, weight loss and malaise/fatigue.  Respiratory: Negative.   Cardiovascular: Negative.  Negative for chest pain.  Gastrointestinal: Positive for abdominal pain. Negative for nausea.  Genitourinary: Negative.   Musculoskeletal: Negative.   Neurological: Negative.  Negative for weakness.    As per HPI. Otherwise, a complete review of systems is negatve.  ONCOLOGY HISTORY: Oncology History   Stage IIB, T2 N1 M0     Pancreatic adenocarcinoma (Kenneth Curry)   01/16/2016 Initial Diagnosis Pancreatic adenocarcinoma [facility])    PAST MEDICAL HISTORY: Past Medical History  Diagnosis Date  . Abdominal aortic aneurysm (Kenneth Curry)   . Pulmonary nodules   . Thrombocytopenia (Kenneth Curry)   . Presence of IVC filter   . Elevated cholesterol   . Diabetes mellitus, type 2 (Kenneth Curry)   . Cellulitis   . History of primary bladder cancer   . Hematuria   . Urinary frequency   . Phimosis   . Pancreatic adenocarcinoma (Kenneth Curry) 01/16/2016    PAST SURGICAL HISTORY: Past Surgical History  Procedure Laterality Date    . Cholecystectomy    . Cochlear implant    . Appendectomy    . Abdominal aortic aneurysm repair    . Upper esophageal endoscopic ultrasound (eus) N/A 01/10/2016    Procedure: UPPER ESOPHAGEAL ENDOSCOPIC ULTRASOUND (EUS);  Surgeon: Cora Daniels, MD;  [facility] END[facility]COPY;  Service: Endoscopy;  Laterality: N/A;    FAMILY HISTORY Family History  Problem Relation Age of Onset  . Lung cancer Brother   . Colon cancer Brother   . Cancer Sister     Renal  . Cancer Mother 80    Kidney  . Cancer Paternal Uncle     melanoma    GYNECOLOGIC HISTORY:  No LMP for male patient.     ADVANCED DIRECTIVES:    HEALTH MAINTENANCE: Social History  Substance Use Topics  . Smoking status: Former Smoker -- 1.00 packs/day    Types: Cigarettes    Quit date: 12/28/2015  . Smokeless tobacco: Not on file  . Alcohol Use: No     Allergies  Allergen Reactions  . Hydrocodone-Acetaminophen Other (See Comments)    Reaction:  Unknown   . Metformin Diarrhea  . Atorvastatin Rash    Current Outpatient Prescriptions  Medication Sig Dispense Refill  . Calcium Carbonate-Vitamin D (CALCIUM 600+D) 600-400 MG-UNIT tablet Take 1 tablet by mouth 2 (two) times daily.    Marland Kitchen glimepiride (AMARYL) 4 MG tablet Take 4 mg by mouth 2 (two) times daily before a meal.     . insulin glargine (LANTUS) 100 unit/mL SOPN Inject 32  Units into the skin at bedtime.    Marland Kitchen levothyroxine (SYNTHROID, LEVOTHROID) 150 MCG tablet Take 150 mcg by mouth daily before breakfast.     . lovastatin (MEVACOR) 20 MG tablet Take 20 mg by mouth at bedtime.     Marland Kitchen omeprazole (PRILOSEC) 20 MG capsule Take 1 capsule (20 mg total) by mouth 2 (two) times daily before a meal. 30 capsule 5  . ondansetron (ZOFRAN) 4 MG tablet Take 1 tablet (4 mg total) by mouth every 8 (eight) hours as needed for nausea or vomiting. 30 tablet 1  . oxyCODONE (OXY IR/ROXICODONE) 5 MG immediate release tablet Take 1 tablet (5 mg total) by mouth every 4 (four)  hours as needed for severe pain. 60 tablet 0  . promethazine (PHENERGAN) 25 MG tablet Take 25 mg by mouth every 6 (six) hours as needed for nausea or vomiting.     . warfarin (COUMADIN) 10 MG tablet Take 10 mg by mouth every evening.     . fentaNYL (DURAGESIC - DOSED MCG/HR) 25 MCG/HR patch Place 1 patch (25 mcg total) onto the skin every 3 (three) days. 10 patch 0   No current facility-administered medications for this visit.    OBJECTIVE: BP 103/69 mmHg  Pulse 79  Temp(Src) 98.3 F (36.8 C) (Tympanic)  Resp 16  Wt 203 lb 0.7 oz (92.1 kg)   Body mass index is 30.88 kg/(m^2).    ECOG FS:2 - Symptomatic, <50% confined to bed  General: Well-developed, well-nourished, no acute distress. Eyes: Pink conjunctiva, anicteric sclera. Lungs: Clear to auscultation bilaterally. Heart: Regular rate and rhythm. No rubs, murmurs, or gallops. Abdomen: Soft, nontender, nondistended. No organomegaly noted, normoactive bowel sounds. Musculoskeletal: No edema, cyanosis, or clubbing. Neuro: Alert, answering all questions appropriately. Cranial nerves grossly intact. Skin: No rashes or petechiae noted. Psych: Normal affect.    LAB RESULTS:  Appointment on 04/11/2016  Component Date Value Ref Range Status  . Prothrombin Time 04/11/2016 41.2* 11.4 - 15.0 seconds Final  . INR 04/11/2016 4.46*  Final   Comment: RESULT CALLED TO, READ BACK BY AND VERIFIED WITH: [place] 1125  04/11/2016 CVP RESULT REPEATED AND VERIFIED   . WBC 04/11/2016 9.7  3.8 - 10.6 K/uL Final  . RBC 04/11/2016 4.45  4.40 - 5.90 MIL/uL Final  . Hemoglobin 04/11/2016 11.2* 13.0 - 18.0 g/dL Final  . HCT 04/11/2016 34.9* 40.0 - 52.0 % Final  . MCV 04/11/2016 78.5* 80.0 - 100.0 fL Final  . MCH 04/11/2016 25.1* 26.0 - 34.0 pg Final  . MCHC 04/11/2016 32.0  32.0 - 36.0 g/dL Final  . RDW 04/11/2016 17.4* 11.5 - 14.5 % Final  . Platelets 04/11/2016 68* 150 - 440 K/uL Final  . Neutrophils Relative % 04/11/2016 67%   Final  . Neutro  Abs 04/11/2016 6.5  1.4 - 6.5 K/uL Final  . Lymphocytes Relative 04/11/2016 25%   Final  . Lymphs Abs 04/11/2016 2.4  1.0 - 3.6 K/uL Final  . Monocytes Relative 04/11/2016 7%   Final  . Monocytes Absolute 04/11/2016 0.7  0.2 - 1.0 K/uL Final  . Eosinophils Relative 04/11/2016 1%   Final  . Eosinophils Absolute 04/11/2016 0.1  0 - 0.7 K/uL Final  . Basophils Relative 04/11/2016 0%   Final  . Basophils Absolute 04/11/2016 0.0  0 - 0.1 K/uL Final  . Sodium 04/11/2016 133* 135 - 145 mmol/L Final  . Potassium 04/11/2016 4.5  3.5 - 5.1 mmol/L Final  . Chloride 04/11/2016 100* 101 - 111  mmol/L Final  . CO2 04/11/2016 25  22 - 32 mmol/L Final  . Glucose, Bld 04/11/2016 220* 65 - 99 mg/dL Final  . BUN 04/11/2016 21* 6 - 20 mg/dL Final  . Creatinine, Ser 04/11/2016 1.20  0.61 - 1.24 mg/dL Final  . Calcium 04/11/2016 8.8* 8.9 - 10.3 mg/dL Final  . Total Protein 04/11/2016 6.7  6.5 - 8.1 g/dL Final  . Albumin 04/11/2016 3.2* 3.5 - 5.0 g/dL Final  . AST 04/11/2016 28  15 - 41 U/L Final  . ALT 04/11/2016 25  17 - 63 U/L Final  . Alkaline Phosphatase 04/11/2016 115  38 - 126 U/L Final  . Total Bilirubin 04/11/2016 1.7* 0.3 - 1.2 mg/dL Final  . GFR calc non Af Amer 04/11/2016 55* >60 mL/min Final  . GFR calc Af Amer 04/11/2016 >60  >60 mL/min Final   Comment: (NOTE) The eGFR has been calculated using the CKD EPI equation. This calculation has not been validated in all clinical situations. eGFR's persistently <60 mL/min signify possible Chronic Kidney Disease.   . Anion gap 04/11/2016 8  5 - 15 Final   Lab Results  Component Value Date   CA199 287* 01/16/2016     ASSESSMENT: Stage IIa adenocarcinoma the pancreas, history of DVT and CLL.  PLAN:    1. Pancreatic cancer: Recent CT scan reviewed independently with mild progression of disease. Previously, patient and his family agreed that he is a poor surgical candidate and this would not be a good option to pursue. He does not wish to pursue  radiation therapy. Chemotherapy was also discussed at length including oral Tarceva, but patient does not wish to pursue this at this time either. Also given his thrombocytopenia, treatment would be difficult.  He said he would reconsider chemotherapy in the future. The option of comfort care and hospice in the future has also been discussed, the patient is not ready for this at this time. No intervention is needed at this time. Return to clinic in 6 weeks with repeat laboratory work and further evaluation.  2. 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 He is currently on 10 mg Coumadin daily lifelong. INR is greater than 4 today. Hold Coumadin Saturday Sunday and restart 10 mg Monday through Friday. 3. CLL: White blood cell count is within normal limits, monitor. 4. Pain: Patient was given a prescription for fentanyl patch today and instructed to continue his oxycodone as needed.  Patient expressed understanding and was in agreement with this plan. He also understands that He can call clinic at any time with any questions, concerns, or complaints.    Pancreatic adenocarcinoma [facility])   Staging form: Pancreas, AJCC 7th Edition     Clinical stage from 01/10/2016: Stage IIB (T2, N1, M0) - Signed by Evlyn Kanner, NP on 01/28/2016   Lloyd Huger, MD   04/11/2016 2:38 PM

## 2016-04-11 NOTE — Telephone Encounter (Signed)
Per patient he is taking Coumadin 10 mg daily, INR 4.4. Please advise.

## 2016-04-11 NOTE — Telephone Encounter (Signed)
Hold for the weekend, restart Monday with 10mg  Mon-Fri and none on sat and sun. Recheck INR in 1 week.

## 2016-04-11 NOTE — Progress Notes (Signed)
Patient reports having right side low back pain that radiates to right side abdomen then groin area that is 6/10 on pain scale.  He does have Oxycodone at home but he does not like to take this because it makes him sleep a lot.

## 2016-04-11 NOTE — Telephone Encounter (Signed)
Patient notified

## 2016-04-12 LAB — CANCER ANTIGEN 19-9: CA 19-9: 581 U/mL — ABNORMAL HIGH (ref 0–35)

## 2016-04-18 ENCOUNTER — Inpatient Hospital Stay: Payer: Commercial Managed Care - HMO

## 2016-04-18 DIAGNOSIS — Z86718 Personal history of other venous thrombosis and embolism: Secondary | ICD-10-CM | POA: Diagnosis not present

## 2016-04-18 DIAGNOSIS — C9110 Chronic lymphocytic leukemia of B-cell type not having achieved remission: Secondary | ICD-10-CM | POA: Diagnosis not present

## 2016-04-18 DIAGNOSIS — R918 Other nonspecific abnormal finding of lung field: Secondary | ICD-10-CM | POA: Diagnosis not present

## 2016-04-18 DIAGNOSIS — C259 Malignant neoplasm of pancreas, unspecified: Secondary | ICD-10-CM | POA: Diagnosis not present

## 2016-04-18 DIAGNOSIS — R109 Unspecified abdominal pain: Secondary | ICD-10-CM | POA: Diagnosis not present

## 2016-04-18 DIAGNOSIS — R630 Anorexia: Secondary | ICD-10-CM | POA: Diagnosis not present

## 2016-04-18 DIAGNOSIS — R110 Nausea: Secondary | ICD-10-CM | POA: Diagnosis not present

## 2016-04-18 DIAGNOSIS — K5900 Constipation, unspecified: Secondary | ICD-10-CM | POA: Diagnosis not present

## 2016-04-18 DIAGNOSIS — I714 Abdominal aortic aneurysm, without rupture: Secondary | ICD-10-CM | POA: Diagnosis not present

## 2016-04-18 LAB — PROTIME-INR
INR: 3.2
Prothrombin Time: 32.2 seconds — ABNORMAL HIGH (ref 11.4–15.0)

## 2016-04-21 DIAGNOSIS — E1165 Type 2 diabetes mellitus with hyperglycemia: Secondary | ICD-10-CM | POA: Diagnosis not present

## 2016-04-21 DIAGNOSIS — Z794 Long term (current) use of insulin: Secondary | ICD-10-CM | POA: Diagnosis not present

## 2016-04-25 ENCOUNTER — Inpatient Hospital Stay: Payer: Commercial Managed Care - HMO

## 2016-04-25 DIAGNOSIS — K5900 Constipation, unspecified: Secondary | ICD-10-CM | POA: Diagnosis not present

## 2016-04-25 DIAGNOSIS — R630 Anorexia: Secondary | ICD-10-CM | POA: Diagnosis not present

## 2016-04-25 DIAGNOSIS — R109 Unspecified abdominal pain: Secondary | ICD-10-CM | POA: Diagnosis not present

## 2016-04-25 DIAGNOSIS — R918 Other nonspecific abnormal finding of lung field: Secondary | ICD-10-CM | POA: Diagnosis not present

## 2016-04-25 DIAGNOSIS — I714 Abdominal aortic aneurysm, without rupture: Secondary | ICD-10-CM | POA: Diagnosis not present

## 2016-04-25 DIAGNOSIS — C259 Malignant neoplasm of pancreas, unspecified: Secondary | ICD-10-CM | POA: Diagnosis not present

## 2016-04-25 DIAGNOSIS — C9110 Chronic lymphocytic leukemia of B-cell type not having achieved remission: Secondary | ICD-10-CM | POA: Diagnosis not present

## 2016-04-25 DIAGNOSIS — Z7901 Long term (current) use of anticoagulants: Secondary | ICD-10-CM

## 2016-04-25 DIAGNOSIS — R110 Nausea: Secondary | ICD-10-CM | POA: Diagnosis not present

## 2016-04-25 DIAGNOSIS — Z86718 Personal history of other venous thrombosis and embolism: Secondary | ICD-10-CM | POA: Diagnosis not present

## 2016-04-25 LAB — PROTIME-INR
INR: 2.77
Prothrombin Time: 28.8 seconds — ABNORMAL HIGH (ref 11.4–15.0)

## 2016-05-08 ENCOUNTER — Telehealth: Payer: Self-pay | Admitting: *Deleted

## 2016-05-08 NOTE — Telephone Encounter (Signed)
I spoke with wife who was speaking with pt and told her per Dr Grayland Ormond, he needs to go to ER, I could hear pt stating his sputum was "only blood tinged during the day and only bloody first thing in the morning"  I stressed the importance of getitng checked as his plt count was low at last check and he is on blood thinners as well. She told him he needs to go

## 2016-05-08 NOTE — Telephone Encounter (Signed)
Patient should probably go to the ER to be evaluated.  He has progressive pancreatic cancer and on coumadin.  Thanks.

## 2016-05-08 NOTE — Telephone Encounter (Signed)
Started spitting up dark red blood with some clots and liquid but just in the mornings since Monday. Asking if he needs to be seen sooner than next Friday

## 2016-05-16 ENCOUNTER — Inpatient Hospital Stay: Payer: Commercial Managed Care - HMO | Attending: Hematology and Oncology

## 2016-05-16 ENCOUNTER — Other Ambulatory Visit: Payer: Self-pay | Admitting: *Deleted

## 2016-05-16 ENCOUNTER — Inpatient Hospital Stay (HOSPITAL_BASED_OUTPATIENT_CLINIC_OR_DEPARTMENT_OTHER): Payer: Commercial Managed Care - HMO | Admitting: Oncology

## 2016-05-16 VITALS — BP 115/73 | HR 77 | Temp 97.6°F | Ht 68.0 in | Wt 198.0 lb

## 2016-05-16 DIAGNOSIS — Z808 Family history of malignant neoplasm of other organs or systems: Secondary | ICD-10-CM | POA: Insufficient documentation

## 2016-05-16 DIAGNOSIS — Z7901 Long term (current) use of anticoagulants: Secondary | ICD-10-CM | POA: Insufficient documentation

## 2016-05-16 DIAGNOSIS — E119 Type 2 diabetes mellitus without complications: Secondary | ICD-10-CM

## 2016-05-16 DIAGNOSIS — Z86718 Personal history of other venous thrombosis and embolism: Secondary | ICD-10-CM | POA: Diagnosis not present

## 2016-05-16 DIAGNOSIS — E7800 Pure hypercholesterolemia, unspecified: Secondary | ICD-10-CM | POA: Diagnosis not present

## 2016-05-16 DIAGNOSIS — K5900 Constipation, unspecified: Secondary | ICD-10-CM | POA: Insufficient documentation

## 2016-05-16 DIAGNOSIS — Z8051 Family history of malignant neoplasm of kidney: Secondary | ICD-10-CM | POA: Insufficient documentation

## 2016-05-16 DIAGNOSIS — N471 Phimosis: Secondary | ICD-10-CM

## 2016-05-16 DIAGNOSIS — Z794 Long term (current) use of insulin: Secondary | ICD-10-CM

## 2016-05-16 DIAGNOSIS — Z8551 Personal history of malignant neoplasm of bladder: Secondary | ICD-10-CM | POA: Diagnosis not present

## 2016-05-16 DIAGNOSIS — Z86711 Personal history of pulmonary embolism: Secondary | ICD-10-CM | POA: Diagnosis not present

## 2016-05-16 DIAGNOSIS — Z87891 Personal history of nicotine dependence: Secondary | ICD-10-CM | POA: Diagnosis not present

## 2016-05-16 DIAGNOSIS — R110 Nausea: Secondary | ICD-10-CM | POA: Diagnosis not present

## 2016-05-16 DIAGNOSIS — R109 Unspecified abdominal pain: Secondary | ICD-10-CM | POA: Insufficient documentation

## 2016-05-16 DIAGNOSIS — I714 Abdominal aortic aneurysm, without rupture: Secondary | ICD-10-CM | POA: Diagnosis not present

## 2016-05-16 DIAGNOSIS — D696 Thrombocytopenia, unspecified: Secondary | ICD-10-CM

## 2016-05-16 DIAGNOSIS — R5383 Other fatigue: Secondary | ICD-10-CM

## 2016-05-16 DIAGNOSIS — Z79899 Other long term (current) drug therapy: Secondary | ICD-10-CM

## 2016-05-16 DIAGNOSIS — R350 Frequency of micturition: Secondary | ICD-10-CM

## 2016-05-16 DIAGNOSIS — R918 Other nonspecific abnormal finding of lung field: Secondary | ICD-10-CM

## 2016-05-16 DIAGNOSIS — I82409 Acute embolism and thrombosis of unspecified deep veins of unspecified lower extremity: Secondary | ICD-10-CM

## 2016-05-16 DIAGNOSIS — R531 Weakness: Secondary | ICD-10-CM | POA: Diagnosis not present

## 2016-05-16 DIAGNOSIS — C9110 Chronic lymphocytic leukemia of B-cell type not having achieved remission: Secondary | ICD-10-CM

## 2016-05-16 DIAGNOSIS — C259 Malignant neoplasm of pancreas, unspecified: Secondary | ICD-10-CM

## 2016-05-16 DIAGNOSIS — Z800 Family history of malignant neoplasm of digestive organs: Secondary | ICD-10-CM | POA: Diagnosis not present

## 2016-05-16 DIAGNOSIS — Z801 Family history of malignant neoplasm of trachea, bronchus and lung: Secondary | ICD-10-CM | POA: Diagnosis not present

## 2016-05-16 DIAGNOSIS — K859 Acute pancreatitis, unspecified: Secondary | ICD-10-CM

## 2016-05-16 LAB — CBC
HCT: 38.7 % — ABNORMAL LOW (ref 40.0–52.0)
Hemoglobin: 12.4 g/dL — ABNORMAL LOW (ref 13.0–18.0)
MCH: 24.4 pg — ABNORMAL LOW (ref 26.0–34.0)
MCHC: 32.2 g/dL (ref 32.0–36.0)
MCV: 75.8 fL — ABNORMAL LOW (ref 80.0–100.0)
PLATELETS: 60 10*3/uL — AB (ref 150–440)
RBC: 5.11 MIL/uL (ref 4.40–5.90)
RDW: 20.4 % — AB (ref 11.5–14.5)
WBC: 7.4 10*3/uL (ref 3.8–10.6)

## 2016-05-16 LAB — COMPREHENSIVE METABOLIC PANEL
ALBUMIN: 3.5 g/dL (ref 3.5–5.0)
ALT: 86 U/L — ABNORMAL HIGH (ref 17–63)
ANION GAP: 7 (ref 5–15)
AST: 126 U/L — AB (ref 15–41)
Alkaline Phosphatase: 291 U/L — ABNORMAL HIGH (ref 38–126)
BUN: 15 mg/dL (ref 6–20)
CHLORIDE: 104 mmol/L (ref 101–111)
CO2: 27 mmol/L (ref 22–32)
Calcium: 9.2 mg/dL (ref 8.9–10.3)
Creatinine, Ser: 1.05 mg/dL (ref 0.61–1.24)
GFR calc Af Amer: >60 mL/min (ref >60)
GFR calc non Af Amer: >60 mL/min (ref >60)
GLUCOSE: 157 mg/dL — AB (ref 65–99)
POTASSIUM: 4.2 mmol/L (ref 3.5–5.1)
SODIUM: 138 mmol/L (ref 135–145)
TOTAL PROTEIN: 6.8 g/dL (ref 6.5–8.1)
Total Bilirubin: 1 mg/dL (ref 0.3–1.2)

## 2016-05-16 LAB — PROTIME-INR
INR: 2.67
Prothrombin Time: 28 seconds — ABNORMAL HIGH (ref 11.4–15.0)

## 2016-05-16 MED ORDER — ONDANSETRON HCL 4 MG PO TABS: 4.0000 mg | ORAL_TABLET | Freq: Three times a day (TID) | ORAL | Status: DC | PRN

## 2016-05-16 MED ORDER — FENTANYL 50 MCG/HR TD PT72: 50.0000 ug | MEDICATED_PATCH | TRANSDERMAL | Status: DC

## 2016-05-16 MED ORDER — OXYCODONE HCL 5 MG PO TABS
5.0000 mg | ORAL_TABLET | ORAL | Status: DC | PRN
Start: 2016-05-16 — End: 2016-07-03

## 2016-05-16 NOTE — Progress Notes (Signed)
Patient here for follow up. Reports decreased appetite, 4 pound wt loss, chronic right sided pain.

## 2016-05-19 ENCOUNTER — Telehealth: Payer: Self-pay | Admitting: *Deleted

## 2016-05-19 DIAGNOSIS — E1165 Type 2 diabetes mellitus with hyperglycemia: Secondary | ICD-10-CM | POA: Diagnosis not present

## 2016-05-19 DIAGNOSIS — E162 Hypoglycemia, unspecified: Secondary | ICD-10-CM | POA: Diagnosis not present

## 2016-05-19 DIAGNOSIS — E1169 Type 2 diabetes mellitus with other specified complication: Secondary | ICD-10-CM | POA: Diagnosis not present

## 2016-05-19 DIAGNOSIS — R739 Hyperglycemia, unspecified: Secondary | ICD-10-CM | POA: Diagnosis not present

## 2016-05-19 DIAGNOSIS — I952 Hypotension due to drugs: Secondary | ICD-10-CM | POA: Diagnosis not present

## 2016-05-19 DIAGNOSIS — Z794 Long term (current) use of insulin: Secondary | ICD-10-CM | POA: Diagnosis not present

## 2016-05-19 NOTE — Telephone Encounter (Signed)
Per Dr Grayland Ormond, can go down on Fentanyl dose, but prefer not to due to pain, Timmothy Sours not feel it is related to Fentanyl patch, but rather his lack of eating and drinking. Instructed Joann of this and she stated that she really does not want ot go down on patch either, she will try to get him to drink more and she has a machine st home to check his BP. Advised her ot call back if needed. She voiced concern of finances and having difficulty finding money to pay for his meds. I transferred her to MSW line to leave a message

## 2016-05-19 NOTE — Telephone Encounter (Signed)
Called to report that he went to see his diabetes doctor this morning and concern was expressed that his BP was 88/52. She reports that she was told it may be related to the Fentanyl he is on. Please advise

## 2016-05-25 NOTE — Progress Notes (Signed)
[place]  Telephone:([phone]) [phone]  [phone]) [phone]     Kenneth Curry DOB: 01/12/1933  MR#: [id number]  PIR#:[id number]  Patient Care Team: Ezequiel Kayser, MD as PCP - General (Internal Medicine) Clent Jacks, RN as Registered Nurse  CHIEF COMPLAINT:  Chief Complaint  Patient presents with  . Pancreatic Cancer  . Follow-up    INTERVAL HISTORY: Patient returns to clinic today for repeat laboratory work and further evaluation. He continues to have a poor appetite and weight loss. He continues to have chronic right flank pain.  He also has occasional nausea as well as constipation. He has no neurologic complaints. He denies any recent fevers. He has no chest pain or shortness of breath. He has no urinary complaints. Patient otherwise feels well and offers no further specific complaints.   REVIEW OF SYSTEMS:   Review of Systems  Constitutional: Positive for malaise/fatigue. Negative for fever and weight loss.  Respiratory: Negative.  Negative for sputum production.   Cardiovascular: Negative.  Negative for chest pain.  Gastrointestinal: Positive for abdominal pain and constipation. Negative for nausea.  Genitourinary: Negative.   Musculoskeletal: Negative.   Neurological: Positive for weakness.  Psychiatric/Behavioral: Negative.     As per HPI. Otherwise, a complete review of systems is negatve.  ONCOLOGY HISTORY: Oncology History   Stage IIB, T2 N1 M0     Pancreatic adenocarcinoma ([place])   01/16/2016 Initial Diagnosis Pancreatic adenocarcinoma [facility])    PAST MEDICAL HISTORY: Past Medical History  Diagnosis Date  . Abdominal aortic aneurysm ([place])   . Pulmonary nodules   . Thrombocytopenia ([place])   . Presence of IVC filter   . Elevated cholesterol   . Diabetes mellitus, type 2 ([place])   . Cellulitis   . History of primary bladder cancer   . Hematuria   . Urinary frequency   . Phimosis   . Pancreatic adenocarcinoma ([place]) 01/16/2016    PAST SURGICAL  HISTORY: Past Surgical History  Procedure Laterality Date  . Cholecystectomy    . Cochlear implant    . Appendectomy    . Abdominal aortic aneurysm repair    . Upper esophageal endoscopic ultrasound (eus) N/A 01/10/2016    Procedure: UPPER ESOPHAGEAL ENDOSCOPIC ULTRASOUND (EUS);  Surgeon: Cora Daniels, MD;  Location: [facility] ENDOSCOPY;  Service: Endoscopy;  Laterality: N/A;    FAMILY HISTORY Family History  Problem Relation Age of Onset  . Lung cancer Brother   . Colon cancer Brother   . Cancer Sister     Renal  . Cancer Mother 77    Kidney  . Cancer Paternal Uncle     melanoma    GYNECOLOGIC HISTORY:  No LMP for male patient.     ADVANCED DIRECTIVES:    HEALTH MAINTENANCE: Social History  Substance Use Topics  . Smoking status: Former Smoker -- 1.00 packs/day    Types: Cigarettes    Quit date: 12/28/2015  . Smokeless tobacco: Not on file  . Alcohol Use: No     Allergies  Allergen Reactions  . Hydrocodone-Acetaminophen Other (See Comments)    Reaction:  Unknown   . Metformin Diarrhea  . Atorvastatin Rash    Current Outpatient Prescriptions  Medication Sig Dispense Refill  . Calcium Carbonate-Vitamin D (CALCIUM 600+D) 600-400 MG-UNIT tablet Take 1 tablet by mouth 2 (two) times daily.    Marland Kitchen glimepiride (AMARYL) 4 MG tablet Take 4 mg by mouth 2 (two) times daily before a meal.     . insulin glargine (LANTUS)  100 unit/mL SOPN Inject 32 Units into the skin at bedtime.    Marland Kitchen levothyroxine (SYNTHROID, LEVOTHROID) 150 MCG tablet Take 150 mcg by mouth daily before breakfast.     . lovastatin (MEVACOR) 20 MG tablet Take 20 mg by mouth at bedtime.     Marland Kitchen omeprazole (PRILOSEC) 20 MG capsule Take 1 capsule (20 mg total) by mouth 2 (two) times daily before a meal. 30 capsule 5  . oxyCODONE (OXY IR/ROXICODONE) 5 MG immediate release tablet Take 1 tablet (5 mg total) by mouth every 4 (four) hours as needed for severe pain. 60 tablet 0  . promethazine (PHENERGAN) 25 MG  tablet Take 25 mg by mouth every 6 (six) hours as needed for nausea or vomiting.     . warfarin (COUMADIN) 10 MG tablet Take 10 mg by mouth every evening.     . fentaNYL (DURAGESIC - DOSED MCG/HR) 50 MCG/HR Place 1 patch (50 mcg total) onto the skin every 3 (three) days. 10 patch 0  . ondansetron (ZOFRAN) 4 MG tablet Take 1 tablet (4 mg total) by mouth every 8 (eight) hours as needed for nausea or vomiting. 60 tablet 1   No current facility-administered medications for this visit.    OBJECTIVE: BP 115/73 mmHg  Pulse 77  Temp(Src) 97.6 F (36.4 C) (Tympanic)  Ht 5' 8"  (1.727 m)  Wt 198 lb (89.812 kg)  BMI 30.11 kg/m2   Body mass index is 30.11 kg/(m^2).    ECOG FS:2 - Symptomatic, <50% confined to bed  General: Well-developed, well-nourished, no acute distress. Eyes: Pink conjunctiva, anicteric sclera. Lungs: Clear to auscultation bilaterally. Heart: Regular rate and rhythm. No rubs, murmurs, or gallops. Abdomen: Soft, nontender, nondistended. No organomegaly noted, normoactive bowel sounds. Musculoskeletal: No edema, cyanosis, or clubbing. Neuro: Alert, answering all questions appropriately. Cranial nerves grossly intact. Skin: No rashes or petechiae noted. Psych: Normal affect.    LAB RESULTS:  Clinical Support on 05/16/2016  Component Date Value Ref Range Status  . WBC 05/16/2016 7.4  3.8 - 10.6 K/uL Final  . RBC 05/16/2016 5.11  4.40 - 5.90 MIL/uL Final  . Hemoglobin 05/16/2016 12.4* 13.0 - 18.0 g/dL Final  . HCT 05/16/2016 38.7* 40.0 - 52.0 % Final  . MCV 05/16/2016 75.8* 80.0 - 100.0 fL Final  . MCH 05/16/2016 24.4* 26.0 - 34.0 pg Final  . MCHC 05/16/2016 32.2  32.0 - 36.0 g/dL Final  . RDW 05/16/2016 20.4* 11.5 - 14.5 % Final  . Platelets 05/16/2016 60* 150 - 440 K/uL Final  . Sodium 05/16/2016 138  135 - 145 mmol/L Final  . Potassium 05/16/2016 4.2  3.5 - 5.1 mmol/L Final  . Chloride 05/16/2016 104  101 - 111 mmol/L Final  . CO2 05/16/2016 27  22 - 32 mmol/L Final    . Glucose, Bld 05/16/2016 157* 65 - 99 mg/dL Final  . BUN 05/16/2016 15  6 - 20 mg/dL Final  . Creatinine, Ser 05/16/2016 1.05  0.61 - 1.24 mg/dL Final  . Calcium 05/16/2016 9.2  8.9 - 10.3 mg/dL Final  . Total Protein 05/16/2016 6.8  6.5 - 8.1 g/dL Final  . Albumin 05/16/2016 3.5  3.5 - 5.0 g/dL Final  . AST 05/16/2016 126* 15 - 41 U/L Final  . ALT 05/16/2016 86* 17 - 63 U/L Final  . Alkaline Phosphatase 05/16/2016 291* 38 - 126 U/L Final  . Total Bilirubin 05/16/2016 1.0  0.3 - 1.2 mg/dL Final  . GFR calc non Af Amer 05/16/2016 >60  >  60 mL/min Final  . GFR calc Af Amer 05/16/2016 >60  >60 mL/min Final   Comment: (NOTE) The eGFR has been calculated using the CKD EPI equation. This calculation has not been validated in all clinical situations. eGFR's persistently <60 mL/min signify possible Chronic Kidney Disease.   . Anion gap 05/16/2016 7  5 - 15 Final  . Prothrombin Time 05/16/2016 28.0* 11.4 - 15.0 seconds Final  . INR 05/16/2016 2.67   Final   Lab Results  Component Value Date   CA199 581* 04/11/2016     ASSESSMENT: Stage IIa adenocarcinoma the pancreas, history of DVT and CLL.  PLAN:    1. Pancreatic cancer: Recent CT scan on March 18, 2016 reviewed independently with mild progression of disease. CA-19-9 also continues to trend up.  Previously, patient and his family agreed that he is a poor surgical candidate and this would not be a good option to pursue. He does not wish to pursue radiation therapy. Chemotherapy was also discussed at length including oral Tarceva, but patient does not wish to pursue this at this time either. Also given his thrombocytopenia, treatment would be difficult. The option of comfort care and hospice in the future has also been discussed, the patient is not ready for this at this time. No intervention is needed at this time. Return to clinic in 3 months with repeat laboratory work and further evaluation.  2. DVT/coumadin management: Patient has a  history of left lower extremity DVT as well as a PE in 2010. He is currently on 10 mg Coumadin daily lifelong. INR is 2.67. Continue current Coumadin regimen. Return to clinic every 2 weeks for laboratory check. 3. CLL: White blood cell count is within normal limits, monitor. 4. Pain: Continue fentanyl patch 50 g every 3 days and oxycodone as needed. 5. Nausea: Continue ondansetron as needed.   Patient expressed understanding and was in agreement with this plan. He also understands that He can call clinic at any time with any questions, concerns, or complaints.    Pancreatic adenocarcinoma [facility])   Staging form: Pancreas, AJCC 7th Edition     Clinical stage from 01/10/2016: Stage IIB (T2, N1, M0) - Signed by Evlyn Kanner, NP on 01/28/2016   Lloyd Huger, MD   05/25/2016 7:50 AM

## 2016-05-30 ENCOUNTER — Other Ambulatory Visit: Payer: Self-pay | Admitting: Oncology

## 2016-05-30 ENCOUNTER — Inpatient Hospital Stay: Payer: Commercial Managed Care - HMO

## 2016-05-30 DIAGNOSIS — R300 Dysuria: Secondary | ICD-10-CM

## 2016-05-30 DIAGNOSIS — Z86711 Personal history of pulmonary embolism: Secondary | ICD-10-CM | POA: Diagnosis not present

## 2016-05-30 DIAGNOSIS — K5900 Constipation, unspecified: Secondary | ICD-10-CM | POA: Diagnosis not present

## 2016-05-30 DIAGNOSIS — Z86718 Personal history of other venous thrombosis and embolism: Secondary | ICD-10-CM | POA: Diagnosis not present

## 2016-05-30 DIAGNOSIS — R109 Unspecified abdominal pain: Secondary | ICD-10-CM | POA: Diagnosis not present

## 2016-05-30 DIAGNOSIS — C9110 Chronic lymphocytic leukemia of B-cell type not having achieved remission: Secondary | ICD-10-CM | POA: Diagnosis not present

## 2016-05-30 DIAGNOSIS — R5383 Other fatigue: Secondary | ICD-10-CM | POA: Diagnosis not present

## 2016-05-30 DIAGNOSIS — C259 Malignant neoplasm of pancreas, unspecified: Secondary | ICD-10-CM

## 2016-05-30 DIAGNOSIS — R110 Nausea: Secondary | ICD-10-CM | POA: Diagnosis not present

## 2016-05-30 DIAGNOSIS — R531 Weakness: Secondary | ICD-10-CM | POA: Diagnosis not present

## 2016-05-30 LAB — BASIC METABOLIC PANEL
ANION GAP: 7 (ref 5–15)
BUN: 14 mg/dL (ref 6–20)
CHLORIDE: 105 mmol/L (ref 101–111)
CO2: 28 mmol/L (ref 22–32)
CREATININE: 0.98 mg/dL (ref 0.61–1.24)
Calcium: 8.7 mg/dL — ABNORMAL LOW (ref 8.9–10.3)
GFR calc non Af Amer: >60 mL/min (ref >60)
GLUCOSE: 123 mg/dL — AB (ref 65–99)
Potassium: 3.9 mmol/L (ref 3.5–5.1)
Sodium: 140 mmol/L (ref 135–145)

## 2016-05-30 LAB — URINALYSIS COMPLETE WITH MICROSCOPIC (ARMC ONLY)
Bacteria, UA: NONE SEEN
GLUCOSE, UA: NEGATIVE mg/dL
Leukocytes, UA: NEGATIVE
NITRITE: NEGATIVE
Protein, ur: 30 mg/dL — AB
Specific Gravity, Urine: >1.03 — ABNORMAL HIGH (ref 1.005–1.030)
WBC UA: NONE SEEN WBC/hpf (ref 0–5)
pH: 5 (ref 5.0–8.0)

## 2016-05-30 LAB — PROTIME-INR
INR: 3.1
PROTHROMBIN TIME: 31.3 s — AB (ref 11.4–15.0)

## 2016-05-31 LAB — CANCER ANTIGEN 19-9: CA 19-9: 461 U/mL — ABNORMAL HIGH (ref 0–35)

## 2016-06-05 ENCOUNTER — Other Ambulatory Visit: Payer: Self-pay | Admitting: *Deleted

## 2016-06-05 MED ORDER — FENTANYL 50 MCG/HR TD PT72: 50.0000 ug | MEDICATED_PATCH | TRANSDERMAL | Status: DC

## 2016-06-05 MED ORDER — ONDANSETRON HCL 4 MG PO TABS: 4.0000 mg | ORAL_TABLET | Freq: Three times a day (TID) | ORAL | Status: DC | PRN

## 2016-06-09 DIAGNOSIS — E119 Type 2 diabetes mellitus without complications: Secondary | ICD-10-CM | POA: Diagnosis not present

## 2016-06-09 DIAGNOSIS — E782 Mixed hyperlipidemia: Secondary | ICD-10-CM | POA: Diagnosis not present

## 2016-06-09 DIAGNOSIS — E039 Hypothyroidism, unspecified: Secondary | ICD-10-CM | POA: Diagnosis not present

## 2016-06-09 DIAGNOSIS — D696 Thrombocytopenia, unspecified: Secondary | ICD-10-CM | POA: Diagnosis not present

## 2016-06-09 DIAGNOSIS — C259 Malignant neoplasm of pancreas, unspecified: Secondary | ICD-10-CM | POA: Diagnosis not present

## 2016-06-09 DIAGNOSIS — C9110 Chronic lymphocytic leukemia of B-cell type not having achieved remission: Secondary | ICD-10-CM | POA: Diagnosis not present

## 2016-06-09 DIAGNOSIS — E559 Vitamin D deficiency, unspecified: Secondary | ICD-10-CM | POA: Diagnosis not present

## 2016-06-09 DIAGNOSIS — Z7901 Long term (current) use of anticoagulants: Secondary | ICD-10-CM | POA: Diagnosis not present

## 2016-06-09 DIAGNOSIS — Z794 Long term (current) use of insulin: Secondary | ICD-10-CM | POA: Diagnosis not present

## 2016-06-13 ENCOUNTER — Inpatient Hospital Stay: Payer: Commercial Managed Care - HMO | Attending: Hematology and Oncology

## 2016-06-13 DIAGNOSIS — C259 Malignant neoplasm of pancreas, unspecified: Secondary | ICD-10-CM | POA: Insufficient documentation

## 2016-06-16 ENCOUNTER — Telehealth: Payer: Self-pay | Admitting: Oncology

## 2016-06-16 NOTE — Telephone Encounter (Signed)
Need to check INR if please come tomorrow if they can.

## 2016-06-16 NOTE — Telephone Encounter (Signed)
Patient was not able to come for lab visit on 06/13/16. Wife asking if they can just skip it and come to next one scheduled for 7/21 or if they should come tomorrow (06/17/16). Please advise. Thanks.

## 2016-06-17 ENCOUNTER — Inpatient Hospital Stay: Payer: Commercial Managed Care - HMO

## 2016-06-17 DIAGNOSIS — C259 Malignant neoplasm of pancreas, unspecified: Secondary | ICD-10-CM | POA: Diagnosis not present

## 2016-06-17 LAB — PROTIME-INR
INR: 2.92
PROTHROMBIN TIME: 30 s — AB (ref 11.4–15.0)

## 2016-06-27 ENCOUNTER — Inpatient Hospital Stay: Payer: Commercial Managed Care - HMO

## 2016-06-27 DIAGNOSIS — C259 Malignant neoplasm of pancreas, unspecified: Secondary | ICD-10-CM | POA: Diagnosis not present

## 2016-06-27 LAB — COMPREHENSIVE METABOLIC PANEL
ALBUMIN: 3.4 g/dL — AB (ref 3.5–5.0)
ALT: 50 U/L (ref 17–63)
ANION GAP: 7 (ref 5–15)
AST: 71 U/L — ABNORMAL HIGH (ref 15–41)
Alkaline Phosphatase: 441 U/L — ABNORMAL HIGH (ref 38–126)
BUN: 11 mg/dL (ref 6–20)
CO2: 28 mmol/L (ref 22–32)
Calcium: 9 mg/dL (ref 8.9–10.3)
Chloride: 105 mmol/L (ref 101–111)
Creatinine, Ser: 0.99 mg/dL (ref 0.61–1.24)
GFR calc Af Amer: >60 mL/min (ref >60)
GFR calc non Af Amer: >60 mL/min (ref >60)
GLUCOSE: 92 mg/dL (ref 65–99)
POTASSIUM: 4 mmol/L (ref 3.5–5.1)
SODIUM: 140 mmol/L (ref 135–145)
Total Bilirubin: 1.4 mg/dL — ABNORMAL HIGH (ref 0.3–1.2)
Total Protein: 6.7 g/dL (ref 6.5–8.1)

## 2016-06-27 LAB — CBC WITH DIFFERENTIAL/PLATELET
Basophils Absolute: 0 10*3/uL (ref 0–0.1)
EOS ABS: 0.1 10*3/uL (ref 0–0.7)
Eosinophils Relative: 2 %
HCT: 40.3 % (ref 40.0–52.0)
HEMOGLOBIN: 12.9 g/dL — AB (ref 13.0–18.0)
LYMPHS ABS: 3.7 10*3/uL — AB (ref 1.0–3.6)
Lymphocytes Relative: 59 %
MCH: 25.2 pg — ABNORMAL LOW (ref 26.0–34.0)
MCHC: 32.1 g/dL (ref 32.0–36.0)
MCV: 78.5 fL — ABNORMAL LOW (ref 80.0–100.0)
MONO ABS: 0.3 10*3/uL (ref 0.2–1.0)
Neutro Abs: 2.1 10*3/uL (ref 1.4–6.5)
PLATELETS: 50 10*3/uL — AB (ref 150–440)
RBC: 5.14 MIL/uL (ref 4.40–5.90)
RDW: 23.3 % — AB (ref 11.5–14.5)
WBC: 6.4 10*3/uL (ref 3.8–10.6)

## 2016-06-27 LAB — PROTIME-INR
INR: 2.91
Prothrombin Time: 29.9 seconds — ABNORMAL HIGH (ref 11.4–15.0)

## 2016-07-02 ENCOUNTER — Telehealth: Payer: Self-pay | Admitting: Oncology

## 2016-07-02 NOTE — Telephone Encounter (Signed)
Patient will need to pick up oxycodone refill Rx on Friday, 07/04/16. Can you please have ready in [place]? Thanks.

## 2016-07-03 ENCOUNTER — Other Ambulatory Visit: Payer: Self-pay | Admitting: *Deleted

## 2016-07-03 DIAGNOSIS — K859 Acute pancreatitis, unspecified: Secondary | ICD-10-CM

## 2016-07-03 MED ORDER — OXYCODONE HCL 5 MG PO TABS: 5.0000 mg | ORAL_TABLET | ORAL | 0 refills | Status: DC | PRN

## 2016-07-03 NOTE — Telephone Encounter (Signed)
Prescription ready, will bring to [facility] tomorrow for patient to pick up.

## 2016-07-04 ENCOUNTER — Other Ambulatory Visit: Payer: Self-pay | Admitting: *Deleted

## 2016-07-04 MED ORDER — FENTANYL 50 MCG/HR TD PT72: 50.0000 ug | MEDICATED_PATCH | TRANSDERMAL | 0 refills | Status: DC

## 2016-07-11 ENCOUNTER — Inpatient Hospital Stay: Payer: Commercial Managed Care - HMO | Attending: Hematology and Oncology

## 2016-07-11 DIAGNOSIS — C259 Malignant neoplasm of pancreas, unspecified: Secondary | ICD-10-CM | POA: Insufficient documentation

## 2016-07-11 LAB — PROTIME-INR
INR: 2.47
Prothrombin Time: 27.2 seconds — ABNORMAL HIGH (ref 11.4–15.2)

## 2016-07-25 ENCOUNTER — Inpatient Hospital Stay: Payer: Commercial Managed Care - HMO

## 2016-07-25 ENCOUNTER — Telehealth: Payer: Self-pay | Admitting: *Deleted

## 2016-07-25 DIAGNOSIS — C259 Malignant neoplasm of pancreas, unspecified: Secondary | ICD-10-CM | POA: Diagnosis not present

## 2016-07-25 LAB — PROTIME-INR
INR: 4.31
Prothrombin Time: 42.5 seconds — ABNORMAL HIGH (ref 11.4–15.2)

## 2016-07-25 NOTE — Telephone Encounter (Signed)
Patients INR 4.4 today. Patient to hold Coumadin Friday, Saturday and Sunday. Patient to restart Coumadin at 5 mg per day on Monday-Friday. Patient to have INR rechecked in 1 week on 8/25.

## 2016-07-26 LAB — CANCER ANTIGEN 19-9: CA 19-9: 306 U/mL — ABNORMAL HIGH (ref 0–35)

## 2016-07-28 ENCOUNTER — Emergency Department: Payer: Commercial Managed Care - HMO

## 2016-07-28 ENCOUNTER — Other Ambulatory Visit: Payer: Self-pay

## 2016-07-28 ENCOUNTER — Observation Stay
Admission: EM | Admit: 2016-07-28 | Discharge: 2016-07-30 | Disposition: A | Discharge status: Home / Self Care | Payer: Commercial Managed Care - HMO | Attending: Internal Medicine | Admitting: Internal Medicine

## 2016-07-28 ENCOUNTER — Telehealth: Payer: Self-pay | Admitting: *Deleted

## 2016-07-28 DIAGNOSIS — E039 Hypothyroidism, unspecified: Secondary | ICD-10-CM | POA: Insufficient documentation

## 2016-07-28 DIAGNOSIS — E860 Dehydration: Secondary | ICD-10-CM | POA: Insufficient documentation

## 2016-07-28 DIAGNOSIS — E7800 Pure hypercholesterolemia, unspecified: Secondary | ICD-10-CM | POA: Insufficient documentation

## 2016-07-28 DIAGNOSIS — R001 Bradycardia, unspecified: Secondary | ICD-10-CM | POA: Diagnosis not present

## 2016-07-28 DIAGNOSIS — N471 Phimosis: Secondary | ICD-10-CM | POA: Insufficient documentation

## 2016-07-28 DIAGNOSIS — R2681 Unsteadiness on feet: Secondary | ICD-10-CM | POA: Insufficient documentation

## 2016-07-28 DIAGNOSIS — Z801 Family history of malignant neoplasm of trachea, bronchus and lung: Secondary | ICD-10-CM | POA: Insufficient documentation

## 2016-07-28 DIAGNOSIS — C259 Malignant neoplasm of pancreas, unspecified: Secondary | ICD-10-CM | POA: Diagnosis not present

## 2016-07-28 DIAGNOSIS — Z888 Allergy status to other drugs, medicaments and biological substances status: Secondary | ICD-10-CM | POA: Insufficient documentation

## 2016-07-28 DIAGNOSIS — Z86711 Personal history of pulmonary embolism: Secondary | ICD-10-CM | POA: Insufficient documentation

## 2016-07-28 DIAGNOSIS — Z8051 Family history of malignant neoplasm of kidney: Secondary | ICD-10-CM | POA: Insufficient documentation

## 2016-07-28 DIAGNOSIS — Z9049 Acquired absence of other specified parts of digestive tract: Secondary | ICD-10-CM | POA: Diagnosis not present

## 2016-07-28 DIAGNOSIS — C9110 Chronic lymphocytic leukemia of B-cell type not having achieved remission: Secondary | ICD-10-CM | POA: Diagnosis not present

## 2016-07-28 DIAGNOSIS — R350 Frequency of micturition: Secondary | ICD-10-CM | POA: Insufficient documentation

## 2016-07-28 DIAGNOSIS — K219 Gastro-esophageal reflux disease without esophagitis: Secondary | ICD-10-CM | POA: Insufficient documentation

## 2016-07-28 DIAGNOSIS — Z86718 Personal history of other venous thrombosis and embolism: Secondary | ICD-10-CM | POA: Insufficient documentation

## 2016-07-28 DIAGNOSIS — K859 Acute pancreatitis without necrosis or infection, unspecified: Secondary | ICD-10-CM | POA: Insufficient documentation

## 2016-07-28 DIAGNOSIS — Z794 Long term (current) use of insulin: Secondary | ICD-10-CM | POA: Insufficient documentation

## 2016-07-28 DIAGNOSIS — Z8679 Personal history of other diseases of the circulatory system: Secondary | ICD-10-CM | POA: Diagnosis not present

## 2016-07-28 DIAGNOSIS — Z808 Family history of malignant neoplasm of other organs or systems: Secondary | ICD-10-CM | POA: Insufficient documentation

## 2016-07-28 DIAGNOSIS — Z87891 Personal history of nicotine dependence: Secondary | ICD-10-CM | POA: Diagnosis not present

## 2016-07-28 DIAGNOSIS — E559 Vitamin D deficiency, unspecified: Secondary | ICD-10-CM | POA: Diagnosis not present

## 2016-07-28 DIAGNOSIS — R55 Syncope and collapse: Principal | ICD-10-CM

## 2016-07-28 DIAGNOSIS — Z515 Encounter for palliative care: Secondary | ICD-10-CM

## 2016-07-28 DIAGNOSIS — W19XXXA Unspecified fall, initial encounter: Secondary | ICD-10-CM | POA: Diagnosis not present

## 2016-07-28 DIAGNOSIS — R112 Nausea with vomiting, unspecified: Secondary | ICD-10-CM | POA: Insufficient documentation

## 2016-07-28 DIAGNOSIS — R531 Weakness: Secondary | ICD-10-CM | POA: Diagnosis not present

## 2016-07-28 DIAGNOSIS — Z66 Do not resuscitate: Secondary | ICD-10-CM

## 2016-07-28 DIAGNOSIS — I714 Abdominal aortic aneurysm, without rupture: Secondary | ICD-10-CM | POA: Diagnosis not present

## 2016-07-28 DIAGNOSIS — E119 Type 2 diabetes mellitus without complications: Secondary | ICD-10-CM | POA: Insufficient documentation

## 2016-07-28 DIAGNOSIS — I10 Essential (primary) hypertension: Secondary | ICD-10-CM | POA: Diagnosis not present

## 2016-07-28 DIAGNOSIS — Z7901 Long term (current) use of anticoagulants: Secondary | ICD-10-CM | POA: Insufficient documentation

## 2016-07-28 DIAGNOSIS — Z8551 Personal history of malignant neoplasm of bladder: Secondary | ICD-10-CM | POA: Diagnosis not present

## 2016-07-28 DIAGNOSIS — Z79899 Other long term (current) drug therapy: Secondary | ICD-10-CM | POA: Insufficient documentation

## 2016-07-28 DIAGNOSIS — K5900 Constipation, unspecified: Secondary | ICD-10-CM | POA: Insufficient documentation

## 2016-07-28 DIAGNOSIS — E785 Hyperlipidemia, unspecified: Secondary | ICD-10-CM | POA: Insufficient documentation

## 2016-07-28 DIAGNOSIS — D6959 Other secondary thrombocytopenia: Secondary | ICD-10-CM | POA: Diagnosis not present

## 2016-07-28 DIAGNOSIS — R1110 Vomiting, unspecified: Secondary | ICD-10-CM

## 2016-07-28 DIAGNOSIS — Z885 Allergy status to narcotic agent status: Secondary | ICD-10-CM | POA: Insufficient documentation

## 2016-07-28 DIAGNOSIS — R042 Hemoptysis: Secondary | ICD-10-CM | POA: Diagnosis not present

## 2016-07-28 LAB — URINALYSIS COMPLETE WITH MICROSCOPIC (ARMC ONLY)
BILIRUBIN URINE: NEGATIVE
Bacteria, UA: NONE SEEN
GLUCOSE, UA: NEGATIVE mg/dL
HGB URINE DIPSTICK: NEGATIVE
KETONES UR: NEGATIVE mg/dL
LEUKOCYTES UA: NEGATIVE
NITRITE: NEGATIVE
PH: 5 (ref 5.0–8.0)
Protein, ur: NEGATIVE mg/dL
RBC / HPF: NONE SEEN RBC/hpf (ref 0–5)
SPECIFIC GRAVITY, URINE: 1.012 (ref 1.005–1.030)
Squamous Epithelial / LPF: NONE SEEN

## 2016-07-28 LAB — CBC WITH DIFFERENTIAL/PLATELET
BASOS PCT: 1 %
Basophils Absolute: 0.1 10*3/uL (ref 0–0.1)
EOS ABS: 0.1 10*3/uL (ref 0–0.7)
EOS PCT: 1 %
HCT: 39.6 % — ABNORMAL LOW (ref 40.0–52.0)
Hemoglobin: 13.2 g/dL (ref 13.0–18.0)
Lymphocytes Relative: 50 %
Lymphs Abs: 2.8 10*3/uL (ref 1.0–3.6)
MCH: 26.6 pg (ref 26.0–34.0)
MCHC: 33.4 g/dL (ref 32.0–36.0)
MCV: 79.6 fL — AB (ref 80.0–100.0)
MONO ABS: 0.3 10*3/uL (ref 0.2–1.0)
Monocytes Relative: 5 %
NEUTROS ABS: 2.5 10*3/uL (ref 1.4–6.5)
NEUTROS PCT: 43 %
PLATELETS: 43 10*3/uL — AB (ref 150–440)
RBC: 4.97 MIL/uL (ref 4.40–5.90)
RDW: 20.6 % — ABNORMAL HIGH (ref 11.5–14.5)
WBC: 5.8 10*3/uL (ref 3.8–10.6)

## 2016-07-28 LAB — COMPREHENSIVE METABOLIC PANEL
ALK PHOS: 425 U/L — AB (ref 38–126)
ALT: 56 U/L (ref 17–63)
ANION GAP: 7 (ref 5–15)
AST: 122 U/L — ABNORMAL HIGH (ref 15–41)
Albumin: 3.4 g/dL — ABNORMAL LOW (ref 3.5–5.0)
BUN: 17 mg/dL (ref 6–20)
CALCIUM: 9.1 mg/dL (ref 8.9–10.3)
CHLORIDE: 107 mmol/L (ref 101–111)
CO2: 25 mmol/L (ref 22–32)
CREATININE: 0.83 mg/dL (ref 0.61–1.24)
Glucose, Bld: 122 mg/dL — ABNORMAL HIGH (ref 65–99)
Potassium: 4.2 mmol/L (ref 3.5–5.1)
Sodium: 139 mmol/L (ref 135–145)
Total Bilirubin: 1.9 mg/dL — ABNORMAL HIGH (ref 0.3–1.2)
Total Protein: 6.6 g/dL (ref 6.5–8.1)

## 2016-07-28 LAB — PROTIME-INR
INR: 2.21
Prothrombin Time: 24.9 seconds — ABNORMAL HIGH (ref 11.4–15.2)

## 2016-07-28 LAB — GLUCOSE, CAPILLARY
Glucose-Capillary: 108 mg/dL — ABNORMAL HIGH (ref 65–99)
Glucose-Capillary: 98 mg/dL (ref 65–99)

## 2016-07-28 LAB — LIPASE, BLOOD: Lipase: 14 U/L (ref 11–51)

## 2016-07-28 LAB — TROPONIN I: Troponin I: <0.03 ng/mL (ref <0.03)

## 2016-07-28 MED ORDER — LEVOTHYROXINE SODIUM 75 MCG PO TABS
150.0000 ug | ORAL_TABLET | Freq: Every day | ORAL | Status: DC
  Administered 2016-07-29 – 2016-07-30 (×2): 150 ug via ORAL
  Filled 2016-07-28 (×2): qty 2

## 2016-07-28 MED ORDER — WARFARIN SODIUM 5 MG PO TABS
5.0000 mg | ORAL_TABLET | Freq: Every evening | ORAL | Status: DC
  Administered 2016-07-28: 5 mg via ORAL
  Filled 2016-07-28: qty 1

## 2016-07-28 MED ORDER — INSULIN ASPART 100 UNIT/ML ~~LOC~~ SOLN
0.0000 [IU] | Freq: Three times a day (TID) | SUBCUTANEOUS | Status: DC
  Administered 2016-07-29 – 2016-07-30 (×2): 1 [IU] via SUBCUTANEOUS
  Filled 2016-07-28 (×2): qty 1

## 2016-07-28 MED ORDER — CALCIUM CARBONATE-VITAMIN D 500-200 MG-UNIT PO TABS
1.0000 | ORAL_TABLET | Freq: Two times a day (BID) | ORAL | Status: DC
  Administered 2016-07-28 – 2016-07-30 (×4): 1 via ORAL
  Filled 2016-07-28 (×4): qty 1

## 2016-07-28 MED ORDER — INSULIN GLARGINE 100 UNIT/ML ~~LOC~~ SOLN
26.0000 [IU] | Freq: Every day | SUBCUTANEOUS | Status: DC
  Administered 2016-07-28 – 2016-07-29 (×2): 26 [IU] via SUBCUTANEOUS
  Filled 2016-07-28 (×4): qty 0.26

## 2016-07-28 MED ORDER — GLIMEPIRIDE 2 MG PO TABS
4.0000 mg | ORAL_TABLET | Freq: Every day | ORAL | Status: DC
  Administered 2016-07-28 – 2016-07-29 (×2): 4 mg via ORAL
  Filled 2016-07-28 (×2): qty 2

## 2016-07-28 MED ORDER — OXYCODONE HCL 5 MG PO TABS
5.0000 mg | ORAL_TABLET | ORAL | Status: DC | PRN
  Administered 2016-07-28 – 2016-07-30 (×3): 5 mg via ORAL
  Filled 2016-07-28 (×3): qty 1

## 2016-07-28 MED ORDER — FENTANYL 50 MCG/HR TD PT72: 50.0000 ug | MEDICATED_PATCH | TRANSDERMAL | Status: DC

## 2016-07-28 MED ORDER — PANTOPRAZOLE SODIUM 40 MG PO TBEC
40.0000 mg | DELAYED_RELEASE_TABLET | Freq: Every day | ORAL | Status: DC
  Administered 2016-07-29 – 2016-07-30 (×2): 40 mg via ORAL
  Filled 2016-07-28 (×2): qty 1

## 2016-07-28 MED ORDER — ONDANSETRON HCL 4 MG PO TABS
4.0000 mg | ORAL_TABLET | Freq: Three times a day (TID) | ORAL | Status: DC | PRN
  Administered 2016-07-28 – 2016-07-29 (×2): 4 mg via ORAL
  Filled 2016-07-28 (×2): qty 1

## 2016-07-28 MED ORDER — FENTANYL 50 MCG/HR TD PT72
50.0000 ug | MEDICATED_PATCH | TRANSDERMAL | Status: DC
Start: 2016-07-30 — End: 2016-07-30
  Administered 2016-07-30: 50 ug via TRANSDERMAL
  Filled 2016-07-28: qty 1

## 2016-07-28 MED ORDER — SODIUM CHLORIDE 0.9 % IV SOLN
1000.0000 mL | Freq: Once | INTRAVENOUS | Status: AC
  Administered 2016-07-28: 1000 mL via INTRAVENOUS

## 2016-07-28 MED ORDER — WARFARIN - PHYSICIAN DOSING INPATIENT
Freq: Every day | Status: DC
  Administered 2016-07-28: 18:00:00

## 2016-07-28 MED ORDER — CALCIUM CARBONATE-VITAMIN D 600-400 MG-UNIT PO TABS: 1.0000 | ORAL_TABLET | Freq: Two times a day (BID) | ORAL | Status: DC

## 2016-07-28 MED ORDER — HEPARIN SODIUM (PORCINE) 5000 UNIT/ML IJ SOLN: 5000.0000 [IU] | Freq: Three times a day (TID) | INTRAMUSCULAR | Status: DC

## 2016-07-28 MED ORDER — INSULIN GLARGINE 100 UNITS/ML SOLOSTAR PEN: 26.0000 [IU] | PEN_INJECTOR | Freq: Every day | SUBCUTANEOUS | Status: DC

## 2016-07-28 NOTE — ED Notes (Signed)
Patient transported to CT 

## 2016-07-28 NOTE — H&P (Signed)
[facility] [place] NAME: Kenneth Curry    MR#:  [id number]  DATE OF BIRTH:  Apr 06, 1933  DATE OF ADMISSION:  07/28/2016  PRIMARY CARE PHYSICIAN: Ezequiel Kayser, MD   REQUESTING/REFERRING PHYSICIAN: [place]  CHIEF COMPLAINT:   Chief Complaint  Patient presents with  . Loss of Consciousness  . Dizziness    HISTORY OF PRESENT ILLNESS: Kenneth Curry  is a 82 y.o. male with a known history of Abdominal aortic aneurysm, diabetes type 2, pancreatic adenocarcinoma, phimosis, history of DVT, IVC filter placement and chronic anticoagulation use with Coumadin- lives at home and made a walker. For last 2 days he is feeling dizzy most of the time when trying to stand up and he had 2 syncopal episodes on Saturday when he had minor falls without any major injuries, and one episode today morning. Every time 5 was present in the house and she noted he had syncopal episode just for a few seconds and he is back to his baseline within few seconds of having a fall. In ER a CT scan of the head was negative but he was noted to have slight bradycardia on telemetry and so given his admission for further monitoring for syncopal episode.  PAST MEDICAL HISTORY:   Past Medical History:  Diagnosis Date  . Abdominal aortic aneurysm ([place])   . Cellulitis   . Diabetes mellitus, type 2 ([place])   . Elevated cholesterol   . Hematuria   . History of primary bladder cancer   . Pancreatic adenocarcinoma ([place]) 01/16/2016  . Phimosis   . Presence of IVC filter   . Pulmonary nodules   . Thrombocytopenia ([place])   . Urinary frequency     PAST SURGICAL HISTORY: Past Surgical History:  Procedure Laterality Date  . ABDOMINAL AORTIC ANEURYSM REPAIR    . APPENDECTOMY    . CHOLECYSTECTOMY    . COCHLEAR IMPLANT    . UPPER ESOPHAGEAL ENDOSCOPIC ULTRASOUND (EUS) N/A 01/10/2016   Procedure: UPPER ESOPHAGEAL ENDOSCOPIC ULTRASOUND (EUS);  Surgeon: Cora Daniels, MD;  Location: [facility]  ENDOSCOPY;  Service: Endoscopy;  Laterality: N/A;    SOCIAL HISTORY:  Social History  Substance Use Topics  . Smoking status: Former Smoker    Packs/day: 1.00    Types: Cigarettes    Quit date: 12/28/2015  . Smokeless tobacco: Never Used  . Alcohol use No    FAMILY HISTORY:  Family History  Problem Relation Age of Onset  . Cancer Mother 56    Kidney  . Lung cancer Brother   . Colon cancer Brother   . Cancer Sister     Renal  . Cancer Paternal Uncle     melanoma    DRUG ALLERGIES:  Allergies  Allergen Reactions  . Hydrocodone-Acetaminophen Other (See Comments)    Reaction:  Unknown   . Metformin Diarrhea  . Atorvastatin Rash    REVIEW OF SYSTEMS:   CONSTITUTIONAL: No fever, fatigue or weakness. Positive for dizziness. EYES: No blurred or double vision.  EARS, NOSE, AND THROAT: No tinnitus or ear pain.  RESPIRATORY: No cough, shortness of breath, wheezing or hemoptysis.  CARDIOVASCULAR: No chest pain, orthopnea, edema.  GASTROINTESTINAL: No nausea, vomiting, diarrhea or abdominal pain.  GENITOURINARY: No dysuria, hematuria.  ENDOCRINE: No polyuria, nocturia,  HEMATOLOGY: No anemia, easy bruising or bleeding SKIN: No rash or lesion. MUSCULOSKELETAL: No joint pain or arthritis.   NEUROLOGIC: No tingling, numbness, weakness.  PSYCHIATRY: No anxiety or depression.   MEDICATIONS  AT HOME:  Prior to Admission medications   Medication Sig Start Date End Date Taking? Authorizing Provider  Calcium Carbonate-Vitamin D (CALCIUM 600+D) 600-400 MG-UNIT tablet Take 1 tablet by mouth 2 (two) times daily.   Yes Historical Provider, MD  fentaNYL (DURAGESIC - DOSED MCG/HR) 50 MCG/HR Place 1 patch (50 mcg total) onto the skin every 3 (three) days. 07/04/16  Yes Lloyd Huger, MD  glimepiride (AMARYL) 4 MG tablet Take 4 mg by mouth daily.    Yes Historical Provider, MD  insulin glargine (LANTUS) 100 unit/mL SOPN Inject 26 Units into the skin at bedtime.    Yes Historical  Provider, MD  levothyroxine (SYNTHROID, LEVOTHROID) 150 MCG tablet Take 150 mcg by mouth daily before breakfast.    Yes Historical Provider, MD  omeprazole (PRILOSEC) 20 MG capsule Take 1 capsule (20 mg total) by mouth 2 (two) times daily before a meal. 01/28/16  Yes Evlyn Kanner, NP  oxyCODONE (OXY IR/ROXICODONE) 5 MG immediate release tablet Take 1 tablet (5 mg total) by mouth every 4 (four) hours as needed for severe pain. 07/03/16  Yes Lloyd Huger, MD  promethazine (PHENERGAN) 25 MG tablet Take 25 mg by mouth every 6 (six) hours as needed for nausea or vomiting.    Yes Historical Provider, MD  warfarin (COUMADIN) 5 MG tablet Take 5 mg by mouth every evening.    Yes Historical Provider, MD  ondansetron (ZOFRAN) 4 MG tablet Take 1 tablet (4 mg total) by mouth every 8 (eight) hours as needed for nausea or vomiting. 06/05/16   Lloyd Huger, MD      PHYSICAL EXAMINATION:   VITAL SIGNS: Blood pressure 122/88, pulse (!) 48, temperature 97.8 F (36.6 C), temperature source Oral, resp. rate (!) 21, height 5\' 8"  (1.727 m), weight 79.4 kg (175 lb), SpO2 97 %.  GENERAL:  82 y.o.-year-old patient lying in the bed with no acute distress.  EYES: Pupils equal, round, reactive to light and accommodation. No scleral icterus. Extraocular muscles intact.  HEENT: Head atraumatic, normocephalic. Oropharynx and nasopharynx clear.  NECK:  Supple, no jugular venous distention. No thyroid enlargement, no tenderness.  LUNGS: Normal breath sounds bilaterally, no wheezing, rales,rhonchi or crepitation. No use of accessory muscles of respiration.  CARDIOVASCULAR: S1, S2 normal. No murmurs, rubs, or gallops.  ABDOMEN: Soft, nontender, nondistended. Bowel sounds present. No organomegaly or mass.  EXTREMITIES: No pedal edema, cyanosis, or clubbing.  NEUROLOGIC: Cranial nerves II through XII are intact. Muscle strength 4/5 in all extremities. Sensation intact. Gait not checked.  PSYCHIATRIC: The patient is  alert and oriented x 3.  SKIN: No obvious rash, lesion, or ulcer.   LABORATORY PANEL:   CBC  Recent Labs Lab 07/28/16 1121  WBC 5.8  HGB 13.2  HCT 39.6*  PLT 43*  MCV 79.6*  MCH 26.6  MCHC 33.4  RDW 20.6*  LYMPHSABS 2.8  MONOABS 0.3  EOSABS 0.1  BASOSABS 0.1   ------------------------------------------------------------------------------------------------------------------  Chemistries   Recent Labs Lab 07/28/16 1121  NA 139  K 4.2  CL 107  CO2 25  GLUCOSE 122*  BUN 17  CREATININE 0.83  CALCIUM 9.1  AST 122*  ALT 56  ALKPHOS 425*  BILITOT 1.9*   ------------------------------------------------------------------------------------------------------------------ estimated creatinine clearance is 68.7 mL/min (by C-G formula based on SCr of 0.83 mg/dL). ------------------------------------------------------------------------------------------------------------------ No results for input(s): TSH, T4TOTAL, T3FREE, THYROIDAB in the last 72 hours.  Invalid input(s): FREET3   Coagulation profile  Recent Labs Lab 07/25/16 1133 07/28/16 1121  INR 4.31* 2.21   ------------------------------------------------------------------------------------------------------------------- No results for input(s): DDIMER in the last 72 hours. -------------------------------------------------------------------------------------------------------------------  Cardiac Enzymes  Recent Labs Lab 07/28/16 1121  TROPONINI <0.03   ------------------------------------------------------------------------------------------------------------------ Invalid input(s): POCBNP  ---------------------------------------------------------------------------------------------------------------  Urinalysis    Component Value Date/Time   COLORURINE AMBER (A) 05/30/2016 1003   APPEARANCEUR CLEAR 05/30/2016 1003   APPEARANCEUR Clear 10/23/2015 0819   LABSPEC >1.030 (H) 05/30/2016 1003    LABSPEC 1.025 09/29/2014 1807   PHURINE 5.0 05/30/2016 1003   GLUCOSEU NEGATIVE 05/30/2016 1003   GLUCOSEU NEGATIVE 09/29/2014 1807   HGBUR 2+ (A) 05/30/2016 1003   BILIRUBINUR 2+ (A) 05/30/2016 1003   BILIRUBINUR Negative 10/23/2015 0819   BILIRUBINUR NEGATIVE 05/04/2014 1214   KETONESUR TRACE (A) 05/30/2016 1003   PROTEINUR 30 (A) 05/30/2016 1003   NITRITE NEGATIVE 05/30/2016 1003   LEUKOCYTESUR NEGATIVE 05/30/2016 1003   LEUKOCYTESUR Negative 10/23/2015 0819   LEUKOCYTESUR NEGATIVE 05/04/2014 1214     RADIOLOGY: Dg Chest 1 View  Result Date: 07/28/2016 CLINICAL DATA:  Dizziness, syncope starting Saturday EXAM: CHEST 1 VIEW COMPARISON:  03/25/2016 FINDINGS: Cardiomegaly. No infiltrate or pleural effusion. No pulmonary edema. Atherosclerotic calcifications of thoracic aorta again noted. Degenerative changes bilateral shoulders. IMPRESSION: No active disease.  Cardiomegaly again noted. Electronically Signed   By: Lahoma Crocker M.D.   On: 07/28/2016 11:54   Ct Head Wo Contrast  Result Date: 07/28/2016 CLINICAL DATA:  Dizziness, syncope, 1 episode Saturday and again today. History of pancreatic cancer. EXAM: CT HEAD WITHOUT CONTRAST TECHNIQUE: Contiguous axial images were obtained from the base of the skull through the vertex without intravenous contrast. COMPARISON:  03/25/2016 FINDINGS: Brain: Mild diffuse cortical atrophy. No acute intracranial abnormality. Specifically, no hemorrhage, hydrocephalus, mass lesion, acute infarction, or significant intracranial injury. Vascular: No hyperdense vessel or unexpected calcification. Skull: No acute calvarial abnormality. Sinuses/Orbits: Visualized paranasal sinuses and mastoids clear. Orbital soft tissues unremarkable. Other: No free fluid or free air. IMPRESSION: No acute intracranial abnormality.  Mild atrophy. Electronically Signed   By: Rolm Baptise M.D.   On: 07/28/2016 13:45    EKG: Orders placed or performed during the hospital encounter of  07/28/16  . ED EKG  . ED EKG    IMPRESSION AND PLAN:  * Syncopal episodes   This is either because of cardiac arrhythmia or inner ear abnormalities.   CT scan of the head and orthostatic vital signs are negative.   We will monitor on telemetry, check urinalysis, get physical therapy evaluation to to disc Hallpike maneuver.  * DVT s/p IVC filter and   On Coumadin, was was on hold because of high INR for last 3 days , now INR is therapeutic, I'll resume with lower dose.   * Diabetes  Resume home medication, keep on insulin sliding scale coverage.  * thrombocytopenia   Due to underlying cancer, monitor.  All the records are reviewed and case discussed with ED provider. Management plans discussed with the patient, family and they are in agreement.  CODE STATUS: Full code  Code Status History    Date Active Date Inactive Code Status Order ID Comments User Context   03/26/2016 12:25 AM 03/27/2016  3:34 PM Full Code [id number]  Lance Coon, MD Inpatient   01/17/2016  5:51 PM 01/19/2016  6:07 PM Full Code [id number]  Henreitta Leber, MD Inpatient    Advance Directive Documentation   Flowsheet Row Most Recent Value  Type of Advance Directive  Healthcare Power of Attorney  Pre-existing out of facility  DNR order (yellow form or pink MOST form)  No data  "MOST" Form in Place?  No data       TOTAL TIME TAKING CARE OF THIS PATIENT: 50 minutes.    Vaughan Basta M.D on 07/28/2016   Between 7am to 6pm - Pager - [phone]  After 6pm go to [email or web address] - password [email or web address]  Office  [phone]  CC: Primary care physician; Ezequiel Kayser, MD   Note: This dictation was prepared with Dragon dictation along with smaller phrase technology. Any transcriptional errors that result from this process are unintentional.

## 2016-07-28 NOTE — Progress Notes (Signed)
Patient admitted at this time. Oriented to room, database completed, family educated and updated. Patient A&O x 4, however very HOH. Kenneth Curry [facility]

## 2016-07-28 NOTE — ED Triage Notes (Signed)
Pt is having c/o dizziness with syncope x2 once Saturday and again at 530am this morning.,  Pt has pancreatic cancer.

## 2016-07-28 NOTE — ED Provider Notes (Signed)
[facility] Emergency Department Provider Note        Time seen: ----------------------------------------- 11:14 AM on 07/28/2016 -----------------------------------------    I have reviewed the triage vital signs and the nursing notes.   HISTORY  Chief Complaint Loss of Consciousness and Dizziness    HPI Kenneth Curry is a 82 y.o. male who presents ER for dizziness and syncope twice recently. Patient states he had a syncopal event Saturday and again at 5:30 this morning. Patient reportedly has untreatable pancreatic cancer, he's had poor by mouth intake and hemoptysis daily. Patient denies fevers, chills, chest pain, shortness of breath, vomiting or diarrhea.   Past Medical History:  Diagnosis Date  . Abdominal aortic aneurysm ([place])   . Cellulitis   . Diabetes mellitus, type 2 ([place])   . Elevated cholesterol   . Hematuria   . History of primary bladder cancer   . Pancreatic adenocarcinoma ([place]) 01/16/2016  . Phimosis   . Presence of IVC filter   . Pulmonary nodules   . Thrombocytopenia ([place])   . Urinary frequency     Patient Active Problem List   Diagnosis Date Noted  . Malignant neoplasm of pancreas ([place])   . Nausea and vomiting 03/25/2016  . Dehydration 03/25/2016  . Fall 03/25/2016  . Controlled type 2 diabetes mellitus without complication ([place]) [id number]  . Acute pancreatitis 01/17/2016  . Pancreatic adenocarcinoma ([place]) 01/16/2016  . Carcinoma of pancreas ([place]) 01/16/2016  . Bradycardia 01/02/2016  . Type 2 diabetes mellitus ([place]) 10/23/2015  . Diabetes mellitus, type 2 ([place]) 06/12/2015  . GERD (gastroesophageal reflux disease) 06/12/2015  . HLD (hyperlipidemia) 06/12/2015  . Adult hypothyroidism 06/12/2015  . Avitaminosis D 06/12/2015  . History of bladder cancer 06/12/2015  . History of neoplasm of bladder 06/12/2015  . Long term current use of anticoagulant 05/18/2015  . Polypharmacy 05/18/2015  . Other long term (current)  drug therapy 05/18/2015  . Chronic lymphatic leukemia ([place]) 05/14/2014  . Bilateral hearing loss 05/14/2014  . Chronic lymphocytic leukemia ([place]) 05/14/2014  . Bladder tumor 05/08/2014  . Deep vein thrombosis ([place]) 10/21/2013  . H/O abdominal aortic aneurysm repair 10/21/2013  . Change in blood platelet count 10/21/2013  . Deep vein thrombosis (DVT) ([place]) 10/21/2013  . History of abdominal aortic aneurysm (AAA) repair 10/21/2013  . Thrombocytopenia ([place]) 10/21/2013    Past Surgical History:  Procedure Laterality Date  . ABDOMINAL AORTIC ANEURYSM REPAIR    . APPENDECTOMY    . CHOLECYSTECTOMY    . COCHLEAR IMPLANT    . UPPER ESOPHAGEAL ENDOSCOPIC ULTRASOUND (EUS) N/A 01/10/2016   Procedure: UPPER ESOPHAGEAL ENDOSCOPIC ULTRASOUND (EUS);  Surgeon: Cora Daniels, MD;  Location: [facility] ENDOSCOPY;  Service: Endoscopy;  Laterality: N/A;    Allergies Hydrocodone-acetaminophen; Metformin; and Atorvastatin  Social History Social History  Substance Use Topics  . Smoking status: Former Smoker    Packs/day: 1.00    Types: Cigarettes    Quit date: 12/28/2015  . Smokeless tobacco: Never Used  . Alcohol use No    Review of Systems Constitutional: Negative for fever.Positive for decreased oral intake Cardiovascular: Negative for chest pain. Respiratory: Negative for shortness of breath. Gastrointestinal: Negative for abdominal pain, vomiting and diarrhea. Genitourinary: Negative for dysuria. Musculoskeletal: Negative for back pain. Skin: Negative for rash. Neurological: Negative for headaches, Positive for weakness  10-point ROS otherwise negative.  ____________________________________________   PHYSICAL EXAM:  VITAL SIGNS: ED Triage Vitals  Enc Vitals Group     BP 07/28/16 1043 122/79  Pulse Rate 07/28/16 1043 64     Resp 07/28/16 1043 18     Temp 07/28/16 1043 97.8 F (36.6 C)     Temp Source 07/28/16 1043 Oral     SpO2 07/28/16 1043 96 %     Weight 07/28/16 1043  175 lb (79.4 kg)     Height 07/28/16 1043 5\' 8"  (1.727 m)     Head Circumference --      Peak Flow --      Pain Score 07/28/16 1044 6     Pain Loc --      Pain Edu? --      Excl. in [place]? --     Constitutional: Alert and oriented. No acute distress Eyes: Conjunctivae are normal. PERRL. Normal extraocular movements. ENT   Head: Normocephalic and atraumatic.   Nose: No congestion/rhinnorhea.   Mouth/Throat: Mucous membranes are moist.   Neck: No stridor. Cardiovascular: Normal rate, regular rhythm. No murmurs, rubs, or gallops. Respiratory: Normal respiratory effort without tachypnea nor retractions. Breath sounds are clear and equal bilaterally. No wheezes/rales/rhonchi. Gastrointestinal: Soft and nontender. Distended abdomen Musculoskeletal: Nontender with normal range of motion in all extremities. No lower extremity tenderness nor edema. Neurologic:  Normal speech and language. No gross focal neurologic deficits are appreciated.  Skin:  Skin is warm, dry and intact. No rash noted. Psychiatric: Mood and affect are normal. Speech and behavior are normal.  ____________________________________________  EKG: Interpreted by me. Sinus rhythm with a rate of 63 bpm, normal PR interval, normal QRS, normal QT interval. Normal axis. No evidence of acute infarction.  ____________________________________________  ED COURSE:  Pertinent labs & imaging results that were available during my care of the patient were reviewed by me and considered in my medical decision making (see chart for details). Clinical Course   Patient is in no acute distress, he will receive IV fluids, will check basic labs and reevaluate.  Procedures ____________________________________________   LABS (pertinent positives/negatives)  Labs Reviewed  CBC WITH DIFFERENTIAL/PLATELET - Abnormal; Notable for the following:       Result Value   HCT 39.6 (*)    MCV 79.6 (*)    RDW 20.6 (*)    Platelets 43 (*)     All other components within normal limits  COMPREHENSIVE METABOLIC PANEL - Abnormal; Notable for the following:    Glucose, Bld 122 (*)    Albumin 3.4 (*)    AST 122 (*)    Alkaline Phosphatase 425 (*)    Total Bilirubin 1.9 (*)    All other components within normal limits  PROTIME-INR - Abnormal; Notable for the following:    Prothrombin Time 24.9 (*)    All other components within normal limits  TROPONIN I  LIPASE, BLOOD  URINALYSIS COMPLETEWITH MICROSCOPIC ([facility] ONLY)    RADIOLOGY Images were viewed by me  Chest x-ray, CT head IMPRESSION: No active disease.  Cardiomegaly again noted. IMPRESSION: No acute intracranial abnormality.  Mild atrophy.   ____________________________________________  FINAL ASSESSMENT AND PLAN  Weakness, syncope, hemoptysis, terminal pancreatic cancer  Plan: Patient with labs and imaging as dictated above. Patient was not orthostatic on evaluation here. Unclear etiology for his syncope at this time. Family is requesting admission for observation. He is currently stable for admission.   Earleen Newport, MD   Note: This dictation was prepared with Dragon dictation. Any transcriptional errors that result from this process are unintentional    Earleen Newport, MD 07/28/16 1351

## 2016-07-28 NOTE — Telephone Encounter (Signed)
Called to report that he is very dizzy and has actually passed out a couple of times. Asking what to do.  Per Dr Grayland Ormond, if he has lost consciousness, he needs to go to ER.  Informed wife of Dr Virgel Manifold recommendation and she said she will take him ans may need to call EMS to take him if he is too unsteady

## 2016-07-28 NOTE — ED Notes (Signed)
Pt given drink and crackers. Ok per EDP

## 2016-07-29 ENCOUNTER — Observation Stay: Payer: Commercial Managed Care - HMO

## 2016-07-29 DIAGNOSIS — W19XXXA Unspecified fall, initial encounter: Secondary | ICD-10-CM | POA: Diagnosis not present

## 2016-07-29 DIAGNOSIS — R55 Syncope and collapse: Secondary | ICD-10-CM | POA: Diagnosis not present

## 2016-07-29 DIAGNOSIS — C250 Malignant neoplasm of head of pancreas: Secondary | ICD-10-CM | POA: Diagnosis not present

## 2016-07-29 DIAGNOSIS — K5900 Constipation, unspecified: Secondary | ICD-10-CM | POA: Diagnosis not present

## 2016-07-29 DIAGNOSIS — R531 Weakness: Secondary | ICD-10-CM | POA: Diagnosis not present

## 2016-07-29 LAB — GLUCOSE, CAPILLARY
Glucose-Capillary: 61 mg/dL — ABNORMAL LOW (ref 65–99)
Glucose-Capillary: 95 mg/dL (ref 65–99)
Glucose-Capillary: 148 mg/dL — ABNORMAL HIGH (ref 65–99)
Glucose-Capillary: 109 mg/dL — ABNORMAL HIGH (ref 65–99)
Glucose-Capillary: 104 mg/dL — ABNORMAL HIGH (ref 65–99)

## 2016-07-29 LAB — BASIC METABOLIC PANEL
Anion gap: 5 (ref 5–15)
BUN: 14 mg/dL (ref 6–20)
CHLORIDE: 110 mmol/L (ref 101–111)
CO2: 27 mmol/L (ref 22–32)
CREATININE: 0.86 mg/dL (ref 0.61–1.24)
Calcium: 8.6 mg/dL — ABNORMAL LOW (ref 8.9–10.3)
GFR calc Af Amer: >60 mL/min (ref >60)
GFR calc non Af Amer: >60 mL/min (ref >60)
Glucose, Bld: 100 mg/dL — ABNORMAL HIGH (ref 65–99)
Potassium: 3.8 mmol/L (ref 3.5–5.1)
Sodium: 142 mmol/L (ref 135–145)

## 2016-07-29 LAB — CBC
HCT: 36 % — ABNORMAL LOW (ref 40.0–52.0)
Hemoglobin: 12 g/dL — ABNORMAL LOW (ref 13.0–18.0)
MCH: 27 pg (ref 26.0–34.0)
MCHC: 33.5 g/dL (ref 32.0–36.0)
MCV: 80.8 fL (ref 80.0–100.0)
PLATELETS: 38 10*3/uL — AB (ref 150–440)
RBC: 4.45 MIL/uL (ref 4.40–5.90)
RDW: 20 % — AB (ref 11.5–14.5)
WBC: 4.4 10*3/uL (ref 3.8–10.6)

## 2016-07-29 LAB — PROTIME-INR
INR: 2.47
Prothrombin Time: 27.2 seconds — ABNORMAL HIGH (ref 11.4–15.2)

## 2016-07-29 MED ORDER — SODIUM CHLORIDE 0.9 % IV BOLUS (SEPSIS)
1000.0000 mL | Freq: Once | INTRAVENOUS | Status: AC
  Administered 2016-07-29: 1000 mL via INTRAVENOUS

## 2016-07-29 MED ORDER — METOCLOPRAMIDE HCL 5 MG/ML IJ SOLN
5.0000 mg | Freq: Three times a day (TID) | INTRAMUSCULAR | Status: DC | PRN
  Administered 2016-07-29 – 2016-07-30 (×2): 5 mg via INTRAVENOUS
  Filled 2016-07-29 (×2): qty 2

## 2016-07-29 MED ORDER — ONDANSETRON HCL 4 MG/2ML IJ SOLN
4.0000 mg | Freq: Four times a day (QID) | INTRAMUSCULAR | Status: DC | PRN
  Administered 2016-07-29 – 2016-07-30 (×2): 4 mg via INTRAVENOUS
  Filled 2016-07-29 (×2): qty 2

## 2016-07-29 MED ORDER — ENSURE ENLIVE PO LIQD
237.0000 mL | Freq: Two times a day (BID) | ORAL | Status: DC
  Administered 2016-07-29 – 2016-07-30 (×3): 237 mL via ORAL

## 2016-07-29 MED ORDER — POLYETHYLENE GLYCOL 3350 17 G PO PACK: 17.0000 g | PACK | Freq: Every day | ORAL | Status: DC | PRN

## 2016-07-29 MED ORDER — SENNOSIDES-DOCUSATE SODIUM 8.6-50 MG PO TABS
2.0000 | ORAL_TABLET | Freq: Two times a day (BID) | ORAL | Status: DC
  Administered 2016-07-29 – 2016-07-30 (×3): 2 via ORAL
  Filled 2016-07-29 (×3): qty 2

## 2016-07-29 NOTE — Clinical Social Work Note (Addendum)
MSW acknowledging consult for patient possibly needing SNF for short term rehab pending recommendations from physical therapy.  MSW to continue to follow patient's progress throughout discharge planning.  Kenneth Curry. Kenneth Curry, MSW [phone]  Mon-Fri 8a-4:30p 07/29/2016 4:03 PM

## 2016-07-29 NOTE — Care Management (Signed)
Presents from home with syncopal episodes. He lives with his wife. Uses a cane and a walker. PCP is Dr. Dorthula Perfect. Pharmacy is [facility]. No DME or home health at this time.

## 2016-07-29 NOTE — Progress Notes (Signed)
[facility] NAME: Kenneth Curry    MR#:  [id number]  DATE OF BIRTH:  1933/03/31  SUBJECTIVE:  CHIEF COMPLAINT:   Chief Complaint  Patient presents with  . Loss of Consciousness  . Dizziness   Had one episode of vomiting today. Continues to feel dizzy on standing up. Orthostatic vitals positive. No bleeding.  REVIEW OF SYSTEMS:    Review of Systems  Constitutional: Negative for chills and fever.  HENT: Negative for sore throat.   Eyes: Negative for blurred vision, double vision and pain.  Respiratory: Negative for cough, hemoptysis, shortness of breath and wheezing.   Cardiovascular: Negative for chest pain, palpitations, orthopnea and leg swelling.  Gastrointestinal: Positive for abdominal pain, constipation, nausea and vomiting. Negative for diarrhea and heartburn.  Genitourinary: Negative for dysuria and hematuria.  Musculoskeletal: Negative for back pain and joint pain.  Skin: Negative for rash.  Neurological: Positive for dizziness. Negative for sensory change, speech change, focal weakness and headaches.  Endo/Heme/Allergies: Does not bruise/bleed easily.  Psychiatric/Behavioral: Negative for depression. The patient is not nervous/anxious.     DRUG ALLERGIES:   Allergies  Allergen Reactions  . Atorvastatin Itching  . Metformin Diarrhea  . Hydrocodone-Acetaminophen Itching and Rash    VITALS:  Blood pressure (!) 117/57, pulse (!) 53, temperature 98 F (36.7 C), resp. rate 20, height 5\' 8"  (1.727 m), weight 85.2 kg (187 lb 14.4 oz), SpO2 96 %.  PHYSICAL EXAMINATION:   Physical Exam  GENERAL:  82 y.o.-year-old patient lying in the bed with no acute distress. Decreased hearing EYES: Pupils equal, round, reactive to light and accommodation. No scleral icterus. Extraocular muscles intact.  HEENT: Head atraumatic, normocephalic. Oropharynx and nasopharynx clear.  NECK:  Supple, no jugular venous distention.  No thyroid enlargement, no tenderness.  LUNGS: Normal breath sounds bilaterally, no wheezing, rales, rhonchi. No use of accessory muscles of respiration.  CARDIOVASCULAR: S1, S2 normal. No murmurs, rubs, or gallops.  ABDOMEN: Soft, nondistended. Bowel sounds present. No organomegaly or mass. Epigastric and left upper quadrant area tenderness EXTREMITIES: No cyanosis, clubbing or edema b/l.    NEUROLOGIC: Cranial nerves II through XII are intact. No focal Motor or sensory deficits b/l.   PSYCHIATRIC: The patient is alert and oriented x 3.  SKIN: No obvious rash, lesion, or ulcer.   LABORATORY PANEL:   CBC  Recent Labs Lab 07/29/16 0400  WBC 4.4  HGB 12.0*  HCT 36.0*  PLT 38*   ------------------------------------------------------------------------------------------------------------------ Chemistries   Recent Labs Lab 07/28/16 1121 07/29/16 0400  NA 139 142  K 4.2 3.8  CL 107 110  CO2 25 27  GLUCOSE 122* 100*  BUN 17 14  CREATININE 0.83 0.86  CALCIUM 9.1 8.6*  AST 122*  --   ALT 56  --   ALKPHOS 425*  --   BILITOT 1.9*  --    ------------------------------------------------------------------------------------------------------------------  Cardiac Enzymes  Recent Labs Lab 07/28/16 1121  TROPONINI <0.03   ------------------------------------------------------------------------------------------------------------------  RADIOLOGY:  Dg Chest 1 View  Result Date: 07/28/2016 CLINICAL DATA:  Dizziness, syncope starting Saturday EXAM: CHEST 1 VIEW COMPARISON:  03/25/2016 FINDINGS: Cardiomegaly. No infiltrate or pleural effusion. No pulmonary edema. Atherosclerotic calcifications of thoracic aorta again noted. Degenerative changes bilateral shoulders. IMPRESSION: No active disease.  Cardiomegaly again noted. Electronically Signed   By: Lahoma Crocker M.D.   On: 07/28/2016 11:54   Ct Head Wo Contrast  Result Date: 07/28/2016 CLINICAL DATA:  Dizziness, syncope, 1 episode  Saturday and again today. History of pancreatic cancer. EXAM: CT HEAD WITHOUT CONTRAST TECHNIQUE: Contiguous axial images were obtained from the base of the skull through the vertex without intravenous contrast. COMPARISON:  03/25/2016 FINDINGS: Brain: Mild diffuse cortical atrophy. No acute intracranial abnormality. Specifically, no hemorrhage, hydrocephalus, mass lesion, acute infarction, or significant intracranial injury. Vascular: No hyperdense vessel or unexpected calcification. Skull: No acute calvarial abnormality. Sinuses/Orbits: Visualized paranasal sinuses and mastoids clear. Orbital soft tissues unremarkable. Other: No free fluid or free air. IMPRESSION: No acute intracranial abnormality.  Mild atrophy. Electronically Signed   By: Rolm Baptise M.D.   On: 07/28/2016 13:45     ASSESSMENT AND PLAN:   * Orthostatic syncope Patient continues to be dizzy. Systolic blood pressure dropped from 144-102 on standing. Bolus 1 L normal saline with only mild improvement. We'll bolus 1 more liter. Due to dehydration from poor appetite and nausea.  * Nausea and vomiting with abdominal pain This is likely due to worsening pancreatic cancer which is not being treated. Check abdominal x-ray to rule out ileus or obstruction. Zofran and Reglan IV when necessary  * Acutely worsening chronic thrombocytopenia Etiology is unclear. Could be due to malignancy. We will stop Coumadin. Consult oncology.  * Diabetes mellitus type 2 Sliding scale insulin and home medications  * Mild sinus bradycardia is chronic and unchanged  * History of DVT/PE with IVC filter Coumadin being stopped due to thrombocytopenia  * DVT prophylaxis SCDs  All the records are reviewed and case discussed with Care Management/Social Workerr. Management plans discussed with the patient, family and they are in agreement.  CODE STATUS: FULL CODE  DVT Prophylaxis: SCDs  TOTAL TIME TAKING CARE OF THIS PATIENT: 35 minutes.    POSSIBLE D/C IN 1-2 DAYS, DEPENDING ON CLINICAL CONDITION.  Hillary Bow R M.D on 07/29/2016 at 1:19 PM  Between 7am to 6pm - Pager - [phone]  After 6pm go to [email or web address] - password [email or web address] [facility]  Office  [phone]  CC: Primary care physician; Ezequiel Kayser, MD  Note: This dictation was prepared with Dragon dictation along with smaller phrase technology. Any transcriptional errors that result from this process are unintentional.

## 2016-07-29 NOTE — Evaluation (Signed)
Physical Therapy Evaluation Patient Details Name: Kenneth Curry MRN: [id number] DOB: 06-May-1933 Today's Date: 07/29/2016   History of Present Illness  Pt is an 82 y.o. male with a known history of Abdominal aortic aneurysm, diabetes type 2, pancreatic adenocarcinoma, phimosis, history of DVT, IVC filter placement and chronic anticoagulation use with Coumadin.  Pt has felt dizzy for the last several days most of the time when trying to stand up and he had 2 syncopal episodes on 07/26/16 when he had minor falls without any major injuries, and one episode 07/28/16. Every time spouse was present in the house and she noted he had syncopal episode just for a few seconds and he is back to his baseline within few seconds of having a fall.  Pt reports symptoms as having "blacked out".  Clinical Impression  Pt presents with deficits in strength/activity tolerance and balance and c/o dizziness with change of position.  Pt's BP in mmHg as follows: supine 124/54, sitting 108/64, and standing 108/66.  Pt c/o moderate dizziness when BP dropped during sup to sit and minimal dizziness at other times during the session not associated with change of position or BP drop.  Pt also reports feeling weaker than baseline with decreased activity tolerance.  Staff arrived to take pt for imaging prior to assessing for possible BPPV but seems unlikely secondary to symptoms present without change of position as well as pt describing "blacking out" during episodes prior to admission. Pt will benefit form additional PT services for further assessment and to address the above issues for a safe return home.     Follow Up Recommendations Home health PT    Equipment Recommendations       Recommendations for Other Services       Precautions / Restrictions Precautions Precautions: Fall Restrictions Weight Bearing Restrictions: No      Mobility  Bed Mobility Overal bed mobility: Independent                 Transfers Overall transfer level: Needs assistance Equipment used: Rolling walker (2 wheeled) Transfers: Sit to/from Stand Sit to Stand: Min guard            Ambulation/Gait Ambulation/Gait assistance: Min guard Ambulation Distance (Feet): 80 Feet Assistive device: Rolling walker (2 wheeled) Gait Pattern/deviations: WFL(Within Functional Limits)   Gait velocity interpretation: <1.8 ft/sec, indicative of risk for recurrent falls General Gait Details: Impulsive with RW with min verbal cues for general safety/sequencing  Stairs Stairs:  (deferred secondary to dizziness)          Wheelchair Mobility    Modified Rankin (Stroke Patients Only)       Balance Overall balance assessment:  (Min instability without UE support)                                           Pertinent Vitals/Pain Pain Assessment: No/denies pain    Home Living Family/patient expects to be discharged to:: Private residence Living Arrangements: Spouse/significant other Available Help at Discharge: Family Type of Home: House Home Access: Stairs to enter Entrance Stairs-Rails: None Technical brewer of Steps: 2 Home Layout: One level        Prior Function Level of Independence: Independent               Hand Dominance   Dominant Hand: Right    Extremity/Trunk Assessment   Upper Extremity Assessment: Overall  WFL for tasks assessed           Lower Extremity Assessment: Generalized weakness         Communication   Communication: HOH  Cognition Arousal/Alertness: Awake/alert Behavior During Therapy: WFL for tasks assessed/performed Overall Cognitive Status: Within Functional Limits for tasks assessed                      General Comments      Exercises Total Joint Exercises Ankle Circles/Pumps: AROM;Both;15 reps Quad Sets: AROM;Both;15 reps Gluteal Sets: AROM;Both;10 reps Heel Slides: AROM;Both;10 reps Straight Leg Raises:  AROM;Both;10 reps Long Arc Quad: AROM;Both;15 reps Knee Flexion: AROM;Both;15 reps      Assessment/Plan    PT Assessment Patient needs continued PT services  PT Diagnosis Difficulty walking;Generalized weakness   PT Problem List Decreased strength;Decreased balance;Decreased activity tolerance  PT Treatment Interventions DME instruction;Gait training;Stair training;Functional mobility training;Therapeutic activities;Therapeutic exercise;Neuromuscular re-education;Balance training;Patient/family education   PT Goals (Current goals can be found in the Care Plan section) Acute Rehab PT Goals Patient Stated Goal: To get stronger PT Goal Formulation: With patient/family Time For Goal Achievement: 08/11/16 Potential to Achieve Goals: Good    Frequency Min 2X/week   Barriers to discharge        Co-evaluation               End of Session Equipment Utilized During Treatment: Gait belt   Patient left: Other (comment) (Staff arrived to transport pt down for imaging)      Functional Assessment Tool Used: clinical judgement Functional Limitation: Mobility: Walking and moving around Mobility: Walking and Moving Around Current Status [phone]): At least 1 percent but less than 20 percent impaired, limited or restricted Mobility: Walking and Moving Around Goal Status [phone]): 0 percent impaired, limited or restricted    Time: [id number] PT Time Calculation (min) (ACUTE ONLY): 48 min   Charges:   PT Evaluation $PT Eval Low Complexity: 1 Procedure PT Treatments $Therapeutic Exercise: 8-22 mins   PT G Codes:   PT G-Codes **NOT FOR INPATIENT CLASS** Functional Assessment Tool Used: clinical judgement Functional Limitation: Mobility: Walking and moving around Mobility: Walking and Moving Around Current Status [id number]): At least 1 percent but less than 20 percent impaired, limited or restricted Mobility: Walking and Moving Around Goal Status [phone]): 0 percent impaired, limited or  restricted    D. Royetta Asal PT, DPT 07/29/16, 4:34 PM

## 2016-07-29 NOTE — Care Management Obs Status (Signed)
MEDICARE OBSERVATION STATUS NOTIFICATION   Patient Details  Name: Kenneth Curry MRN: [id number] Date of Birth: 07-Nov-1933   Medicare Observation Status Notification Given:  Yes    Jolly Mango, RN 07/29/2016, 12:11 PM

## 2016-07-29 NOTE — Progress Notes (Signed)
Initial Nutrition Assessment  DOCUMENTATION CODES:    Severe malnutrition in context of chronic illness  INTERVENTION:  -Ensure Enlive po BID, each supplement provides 350 kcal and 20 grams of protein  NUTRITION DIAGNOSIS:   Malnutrition related to chronic illness as evidenced by severe depletion of muscle mass, percent weight loss.  GOAL:   Patient will meet greater than or equal to 90% of their needs  MONITOR:   PO intake, I & O's, Labs, Weight trends, Supplement acceptance  REASON FOR ASSESSMENT:   Malnutrition Screening Tool    ASSESSMENT:   Kenneth Curry  is a 82 y.o. male with a known history of Abdominal aortic aneurysm, diabetes type 2, pancreatic adenocarcinoma, phimosis, history of DVT, IVC filter placement and chronic anticoagulation use with Coumadin- lives at home and made a walker.  Spoke with Kenneth Curry, family at bedside. He had an omelet this morning, ate about half of it, but complains of poor appetite, taste alterations. He believes one of his medications causes it. Family endorses a wt loss of 30# over several months. Per chart review, pt exhibits a 16#/7.8% moderate weight loss over 3.5 months. He endorses some nausea, possibly related to the food he ate. Nutrition-Focused physical exam completed. Findings are no fat depletion, severe muscle depletion, and no edema.  Pt states that only thing that helps him with taste alterations is mountain dew, will provide with diet mountain dew on each tray. He also does not like spicy foods, noted in chart.  Labs and Medications reviewed: Tot Bili 1.9 Ca-Vit D, Reglan PRN    Diet Order:  Diet heart healthy/carb modified Room service appropriate? Yes; Fluid consistency: Thin  Skin:  Reviewed, no issues  Last BM:  07/28/2016  Height:   Ht Readings from Last 1 Encounters:  07/28/16 5\' 8"  (1.727 m)    Weight:   Wt Readings from Last 1 Encounters:  07/28/16 187 lb 14.4 oz (85.2 kg)    Ideal Body Weight:  70  kg  BMI:  Body mass index is 28.57 kg/m.  Estimated Nutritional Needs:   Kcal:  1600-1900 calories  Protein:  85-102 grams  Fluid:  >/= 1.6L  EDUCATION NEEDS:   No education needs identified at this time  Satira Anis. Mckenzie Toruno, MS, RD LDN Inpatient Clinical Dietitian Pager [phone]

## 2016-07-30 ENCOUNTER — Encounter: Payer: Self-pay | Admitting: *Deleted

## 2016-07-30 DIAGNOSIS — C259 Malignant neoplasm of pancreas, unspecified: Secondary | ICD-10-CM | POA: Diagnosis not present

## 2016-07-30 DIAGNOSIS — W19XXXA Unspecified fall, initial encounter: Secondary | ICD-10-CM | POA: Diagnosis not present

## 2016-07-30 DIAGNOSIS — D696 Thrombocytopenia, unspecified: Secondary | ICD-10-CM | POA: Diagnosis not present

## 2016-07-30 DIAGNOSIS — E119 Type 2 diabetes mellitus without complications: Secondary | ICD-10-CM

## 2016-07-30 DIAGNOSIS — I714 Abdominal aortic aneurysm, without rupture: Secondary | ICD-10-CM

## 2016-07-30 DIAGNOSIS — Z66 Do not resuscitate: Secondary | ICD-10-CM | POA: Diagnosis not present

## 2016-07-30 DIAGNOSIS — R634 Abnormal weight loss: Secondary | ICD-10-CM

## 2016-07-30 DIAGNOSIS — Z86718 Personal history of other venous thrombosis and embolism: Secondary | ICD-10-CM

## 2016-07-30 DIAGNOSIS — Z801 Family history of malignant neoplasm of trachea, bronchus and lung: Secondary | ICD-10-CM

## 2016-07-30 DIAGNOSIS — Z794 Long term (current) use of insulin: Secondary | ICD-10-CM

## 2016-07-30 DIAGNOSIS — R110 Nausea: Secondary | ICD-10-CM

## 2016-07-30 DIAGNOSIS — R42 Dizziness and giddiness: Secondary | ICD-10-CM | POA: Diagnosis not present

## 2016-07-30 DIAGNOSIS — Z789 Other specified health status: Secondary | ICD-10-CM | POA: Diagnosis not present

## 2016-07-30 DIAGNOSIS — Z515 Encounter for palliative care: Secondary | ICD-10-CM

## 2016-07-30 DIAGNOSIS — Z79899 Other long term (current) drug therapy: Secondary | ICD-10-CM

## 2016-07-30 DIAGNOSIS — E7800 Pure hypercholesterolemia, unspecified: Secondary | ICD-10-CM

## 2016-07-30 DIAGNOSIS — R531 Weakness: Secondary | ICD-10-CM | POA: Diagnosis not present

## 2016-07-30 DIAGNOSIS — R5383 Other fatigue: Secondary | ICD-10-CM

## 2016-07-30 DIAGNOSIS — Z800 Family history of malignant neoplasm of digestive organs: Secondary | ICD-10-CM

## 2016-07-30 DIAGNOSIS — Z87891 Personal history of nicotine dependence: Secondary | ICD-10-CM

## 2016-07-30 DIAGNOSIS — C250 Malignant neoplasm of head of pancreas: Secondary | ICD-10-CM | POA: Diagnosis not present

## 2016-07-30 DIAGNOSIS — Z809 Family history of malignant neoplasm, unspecified: Secondary | ICD-10-CM

## 2016-07-30 DIAGNOSIS — R55 Syncope and collapse: Secondary | ICD-10-CM

## 2016-07-30 DIAGNOSIS — Z8551 Personal history of malignant neoplasm of bladder: Secondary | ICD-10-CM

## 2016-07-30 DIAGNOSIS — R109 Unspecified abdominal pain: Secondary | ICD-10-CM

## 2016-07-30 DIAGNOSIS — R319 Hematuria, unspecified: Secondary | ICD-10-CM

## 2016-07-30 DIAGNOSIS — R918 Other nonspecific abnormal finding of lung field: Secondary | ICD-10-CM

## 2016-07-30 DIAGNOSIS — R350 Frequency of micturition: Secondary | ICD-10-CM

## 2016-07-30 DIAGNOSIS — R630 Anorexia: Secondary | ICD-10-CM

## 2016-07-30 LAB — CBC WITH DIFFERENTIAL/PLATELET
Basophils Absolute: 0 10*3/uL (ref 0–0.1)
Basophils Relative: 0 %
EOS ABS: 0.1 10*3/uL (ref 0–0.7)
Eosinophils Relative: 2 %
HCT: 36.9 % — ABNORMAL LOW (ref 40.0–52.0)
Hemoglobin: 12.3 g/dL — ABNORMAL LOW (ref 13.0–18.0)
LYMPHS ABS: 2.3 10*3/uL (ref 1.0–3.6)
Lymphocytes Relative: 52 %
MCH: 26.7 pg (ref 26.0–34.0)
MCHC: 33.4 g/dL (ref 32.0–36.0)
MCV: 79.9 fL — ABNORMAL LOW (ref 80.0–100.0)
MONO ABS: 0.3 10*3/uL (ref 0.2–1.0)
Monocytes Relative: 7 %
NEUTROS PCT: 39 %
Neutro Abs: 1.8 10*3/uL (ref 1.4–6.5)
PLATELETS: 37 10*3/uL — AB (ref 150–440)
RBC: 4.61 MIL/uL (ref 4.40–5.90)
RDW: 20.5 % — AB (ref 11.5–14.5)
WBC: 4.5 10*3/uL (ref 3.8–10.6)

## 2016-07-30 LAB — GLUCOSE, CAPILLARY
GLUCOSE-CAPILLARY: 71 mg/dL (ref 65–99)
GLUCOSE-CAPILLARY: 129 mg/dL — AB (ref 65–99)
GLUCOSE-CAPILLARY: 197 mg/dL — AB (ref 65–99)
Glucose-Capillary: 49 mg/dL — ABNORMAL LOW (ref 65–99)

## 2016-07-30 MED ORDER — BISACODYL 5 MG PO TBEC
10.0000 mg | DELAYED_RELEASE_TABLET | Freq: Once | ORAL | Status: AC
  Administered 2016-07-30: 10 mg via ORAL
  Filled 2016-07-30: qty 2

## 2016-07-30 MED ORDER — SENNOSIDES-DOCUSATE SODIUM 8.6-50 MG PO TABS
1.0000 | ORAL_TABLET | Freq: Two times a day (BID) | ORAL | 0 refills | Status: AC
Start: 2016-07-30 — End: ?

## 2016-07-30 MED ORDER — INSULIN GLARGINE 100 UNIT/ML ~~LOC~~ SOLN
22.0000 [IU] | Freq: Every day | SUBCUTANEOUS | Status: DC
  Filled 2016-07-30: qty 0.22

## 2016-07-30 MED ORDER — GLIMEPIRIDE 2 MG PO TABS: 1.0000 mg | ORAL_TABLET | Freq: Every day | ORAL | Status: DC

## 2016-07-30 MED ORDER — INSULIN GLARGINE 100 UNITS/ML SOLOSTAR PEN: 20.0000 [IU] | PEN_INJECTOR | Freq: Every day | SUBCUTANEOUS | Status: AC

## 2016-07-30 NOTE — Progress Notes (Signed)
New referral for [facility] services at home following discharge received from [facility]. Mr. Mowatt is an 82 year old man with a known history of pancreatic cancer, admitted to [facility] on 8/21 for evaluation of dizziness with syncopal episodes. PMH includes DM II, abdominal aortic aneurysm, DVT, IVC placement, chronic anticoagulant use with coumadin and cochlear implant. Head CT was negative for any acute process, he was noted to have some bradycardia while in the ED. Per chart note review Mr. Dziedzic was diagnosed with pancreatic cancer in February 2017, he hs declined any treatment. He has had a poor appetite and weight loss in the interim. Oncology is not offering any treatment options at this time. Family met with Palliative Medicine NP Wadie Lessen and have chosen to discharge home with hospice services. Writer met in the room with patient, his wife Pricilla Holm and daughter Judeen Hammans to initiate education regarding hospice services, philosophy and team approach to care with good understanding voiced. Questions answered. Mrs. Marhefka declined the need for any DME at this time. Hospice information and contact number given to Mrs. Recore. Patient was alert throughout the visit but declined to participate in the dicussion. Patient information faxed to hospice referral. Of note pain is currently managed with a 50 mcg fentanyl patch, changed today 8/23 and oxycodone IR 75m for break through pain. Patient denied pain during visit.  Per discussion with CMRN LOrvan Julyplan was for discharge tomorrow 8/24, however pateint did discharge home today 8/23. Thank you. KFlo ShanksRN, BSN, [facility] liaison [phone]

## 2016-07-30 NOTE — Consult Note (Signed)
Kenneth Curry  [phone] Fax:([phone]) [phone]  ID: Kenneth Curry OB: 1933-11-26  MR#: [id number]  [id number]  Patient Care Team: Kenneth Kayser, Kenneth Curry as PCP - General (Internal Medicine) Kenneth Jacks, RN as Registered Nurse  CHIEF COMPLAINT:   INTERVAL HISTORY: Patient is an 82 year old male with a known diagnosis of pancreatic cancer who has declined any treatment or intervention. He was last evaluated in clinic in June 2017. He presented to the emergency room with multiple episodes of syncope including loss of consciousness. His daughter reports his appetite is poor and he has lost weight in the interim. He has persistent nausea. He is frequently dizzy and unsteady on his feet. His pain is not very well controlled at this point either. He has no other neurologic complaints. He denies any chest pain or shortness of breath. He denies any vomiting, constipation, or diarrhea. He has no urinary complaints. Patient offers no further specific complaints.  REVIEW OF SYSTEMS:   Review of Systems  Constitutional: Positive for malaise/fatigue and weight loss. Negative for fever.  Respiratory: Negative.  Negative for cough and shortness of breath.   Cardiovascular: Negative.  Negative for chest pain.  Gastrointestinal: Positive for abdominal pain and nausea.  Genitourinary: Negative.   Musculoskeletal: Negative.   Neurological: Positive for dizziness, loss of consciousness and weakness. Negative for seizures.  Psychiatric/Behavioral: Negative.     As per HPI. Otherwise, a complete review of systems is negatve.  PAST MEDICAL HISTORY: Past Medical History:  Diagnosis Date  . Abdominal aortic aneurysm ([place])   . Cellulitis   . Diabetes mellitus, type 2 ([place])   . Elevated cholesterol   . Hematuria   . History of primary bladder cancer   . Pancreatic adenocarcinoma ([place]) 01/16/2016  . Phimosis   . Presence of IVC filter   . Pulmonary nodules   .  Thrombocytopenia ([place])   . Urinary frequency     PAST SURGICAL HISTORY: Past Surgical History:  Procedure Laterality Date  . ABDOMINAL AORTIC ANEURYSM REPAIR    . APPENDECTOMY    . CHOLECYSTECTOMY    . COCHLEAR IMPLANT    . UPPER ESOPHAGEAL ENDOSCOPIC ULTRASOUND (EUS) N/A 01/10/2016   Procedure: UPPER ESOPHAGEAL ENDOSCOPIC ULTRASOUND (EUS);  Surgeon: Cora Daniels, Kenneth Curry;  Location: [facility] ENDOSCOPY;  Service: Endoscopy;  Laterality: N/A;    FAMILY HISTORY: Family History  Problem Relation Age of Onset  . Cancer Mother 6    Kidney  . Lung cancer Brother   . Colon cancer Brother   . Cancer Sister     Renal  . Cancer Paternal Uncle     melanoma       ADVANCED DIRECTIVES (Y/N):  @ADVDIR @   HEALTH MAINTENANCE: Social History  Substance Use Topics  . Smoking status: Former Smoker    Packs/day: 1.00    Types: Cigarettes    Quit date: 12/28/2015  . Smokeless tobacco: Never Used  . Alcohol use No     Colonoscopy:  PAP:  Bone density:  Lipid panel:  Allergies  Allergen Reactions  . Atorvastatin Itching  . Metformin Diarrhea  . Hydrocodone-Acetaminophen Itching and Rash    Current Facility-Administered Medications  Medication Dose Route Frequency Provider Last Rate Last Dose  . calcium-vitamin D (OSCAL WITH D) 500-200 MG-UNIT per tablet 1 tablet  1 tablet Oral BID Kenneth Basta, Kenneth Curry   1 tablet at 07/30/16 [phone]  . feeding supplement (ENSURE ENLIVE) (ENSURE ENLIVE) liquid 237 mL  237 mL Oral BID BM Kenneth  Sudini, Kenneth Curry   237 mL at 07/30/16 1000  . fentaNYL (DURAGESIC - dosed mcg/hr) 50 mcg  50 mcg Transdermal Q72H Kenneth Curry, Kenneth Curry   50 mcg at 07/30/16 1106  . insulin aspart (novoLOG) injection 0-9 Units  0-9 Units Subcutaneous TID WC Kenneth Basta, Kenneth Curry   1 Units at 07/30/16 1146  . insulin glargine (LANTUS) injection 22 Units  22 Units Subcutaneous QHS Kenneth Sudini, Kenneth Curry      . levothyroxine (SYNTHROID, LEVOTHROID) tablet 150 mcg  150 mcg Oral QAC  breakfast Kenneth Basta, Kenneth Curry   150 mcg at 07/30/16 0852  . metoCLOPramide (REGLAN) injection 5 mg  5 mg Intravenous Q8H PRN Kenneth Bow, Kenneth Curry   5 mg at 07/29/16 1146  . ondansetron (ZOFRAN) injection 4 mg  4 mg Intravenous Q6H PRN Kenneth Bow, Kenneth Curry   4 mg at 07/30/16 0941  . oxyCODONE (Oxy IR/ROXICODONE) immediate release tablet 5 mg  5 mg Oral Q4H PRN Kenneth Basta, Kenneth Curry   5 mg at 07/30/16 0852  . pantoprazole (PROTONIX) EC tablet 40 mg  40 mg Oral QAC breakfast Kenneth Basta, Kenneth Curry   40 mg at 07/30/16 0852  . polyethylene glycol (MIRALAX / GLYCOLAX) packet 17 g  17 g Oral Daily PRN Kenneth Sudini, Kenneth Curry      . senna-docusate (Senokot-S) tablet 2 tablet  2 tablet Oral BID Kenneth Bow, Kenneth Curry   2 tablet at 07/30/16 0852    OBJECTIVE: Vitals:   07/30/16 0745 07/30/16 1117  BP: 100/76 113/73  Pulse: 62 (!) 57  Resp: 18 18  Temp:  97.8 F (36.6 C)     Body mass index is 28.57 kg/m.    ECOG FS:3 - Symptomatic, >50% confined to bed  General: Well-developed, well-nourished, no acute distress. Eyes: Pink conjunctiva, anicteric sclera. HEENT: Normocephalic, moist mucous membranes, clear oropharnyx. Lungs: Clear to auscultation bilaterally. Heart: Regular rate and rhythm. No rubs, murmurs, or gallops. Abdomen: Soft, nontender, nondistended. No organomegaly noted, normoactive bowel sounds. Musculoskeletal: No edema, cyanosis, or clubbing. Neuro: Alert, answering all questions appropriately. Cranial nerves grossly intact. Skin: No rashes or petechiae noted. Psych: Normal affect. Lymphatics: No cervical, calvicular, axillary or inguinal LAD.   LAB RESULTS:  Lab Results  Component Value Date   NA 142 07/29/2016   K 3.8 07/29/2016   CL 110 07/29/2016   CO2 27 07/29/2016   GLUCOSE 100 (H) 07/29/2016   BUN 14 07/29/2016   CREATININE 0.86 07/29/2016   CALCIUM 8.6 (L) 07/29/2016   PROT 6.6 07/28/2016   ALBUMIN 3.4 (L) 07/28/2016   AST 122 (H) 07/28/2016   ALT 56 07/28/2016     ALKPHOS 425 (H) 07/28/2016   BILITOT 1.9 (H) 07/28/2016   GFRNONAA >60 07/29/2016   GFRAA >60 07/29/2016    Lab Results  Component Value Date   WBC 4.5 07/30/2016   NEUTROABS 1.8 07/30/2016   HGB 12.3 (L) 07/30/2016   HCT 36.9 (L) 07/30/2016   MCV 79.9 (L) 07/30/2016   PLT 37 (L) 07/30/2016   Lab Results  Component Value Date   CA199 306 (H) 07/25/2016     STUDIES: Dg Chest 1 View  Result Date: 07/28/2016 CLINICAL DATA:  Dizziness, syncope starting Saturday EXAM: CHEST 1 VIEW COMPARISON:  03/25/2016 FINDINGS: Cardiomegaly. No infiltrate or pleural effusion. No pulmonary edema. Atherosclerotic calcifications of thoracic aorta again noted. Degenerative changes bilateral shoulders. IMPRESSION: No active disease.  Cardiomegaly again noted. Electronically Signed   By: Lahoma Crocker M.D.   On: 07/28/2016 11:54  Ct Head Wo Contrast  Result Date: 07/28/2016 CLINICAL DATA:  Dizziness, syncope, 1 episode Saturday and again today. History of pancreatic cancer. EXAM: CT HEAD WITHOUT CONTRAST TECHNIQUE: Contiguous axial images were obtained from the base of the skull through the vertex without intravenous contrast. COMPARISON:  03/25/2016 FINDINGS: Brain: Mild diffuse cortical atrophy. No acute intracranial abnormality. Specifically, no hemorrhage, hydrocephalus, mass lesion, acute infarction, or significant intracranial injury. Vascular: No hyperdense vessel or unexpected calcification. Skull: No acute calvarial abnormality. Sinuses/Orbits: Visualized paranasal sinuses and mastoids clear. Orbital soft tissues unremarkable. Other: No free fluid or free air. IMPRESSION: No acute intracranial abnormality.  Mild atrophy. Electronically Signed   By: Rolm Baptise M.D.   On: 07/28/2016 13:45   Dg Abd 2 Views  Result Date: 07/29/2016 CLINICAL DATA:  Nausea vomiting and abdominal pain EXAM: ABDOMEN - 2 VIEW COMPARISON:  03/25/2016 FINDINGS: Aortic stent graft and IVC filter are again noted and stable. No  free air is seen. Scattered large and small bowel gas is noted. Fecal material is noted throughout the colon consistent with a mild degree of constipation. Degenerative changes of the lumbar spine and hip joints are seen. IMPRESSION: Mild constipation.  No acute abnormality noted. Electronically Signed   By: Inez Catalina M.D.   On: 07/29/2016 16:01    ASSESSMENT: Likely progressive pancreatic cancer, syncope, declining performance status.  PLAN:    1. Pancreatic cancer: Although patient's CA-19-9 has remained relatively stable his symptoms, including progressive thrombocytopenia, suggest progression of disease. Given patient's decreased performance status, advanced age, thrombocytopenia no treatment options are available. Hospice has been discussed in the past, but patient has refused. This was again brought up today with the patient and his daughters and they seem more agreeable to pursuing hospice. Appreciate palliative care input. If patient enrolls in hospice, no follow-up is necessary in the [place]. 2. Dizziness/loss of consciousness: Likely secondary to hypotension and dehydration. Continue conservative management as indicated. 3. Thrombocytopenia: Multifactorial, although possibly related to progression of disease. No intervention is needed at this time. 4. DVT: Coumadin has been discontinued secondary to thrombocytopenia. 5. Pain: Continue current narcotic regimen as indicated. 6. Nausea: Agree with current antibiotics.  Appreciate consult, will follow.   Kenneth Huger, Kenneth Curry   07/30/2016 1:51 PM

## 2016-07-30 NOTE — Discharge Instructions (Signed)
Regular diet and activity.  Take time to stand up from laying to prevent dizziness and passing out.  Drink plenty of fluids

## 2016-07-30 NOTE — Progress Notes (Signed)
Discharge instructions explained to pt and pts family/ verbalized an understanding/ iv and tele removed/ transported off unit via wheelchair.  

## 2016-07-30 NOTE — Progress Notes (Signed)
[facility] [place] NAME: Kenneth Curry    MR#:  [id number]  DATE OF BIRTH:  [birth year]  SUBJECTIVE:  CHIEF COMPLAINT:   Chief Complaint  Patient presents with  . Loss of Consciousness  . Dizziness   Had one episode of vomiting today. Continues to feel dizzy on standing up. Orthostatic vitals positive. No bleeding.  REVIEW OF SYSTEMS:    Review of Systems  Constitutional: Negative for chills and fever.  HENT: Negative for sore throat.   Eyes: Negative for blurred vision, double vision and pain.  Respiratory: Negative for cough, hemoptysis, shortness of breath and wheezing.   Cardiovascular: Negative for chest pain, palpitations, orthopnea and leg swelling.  Gastrointestinal: Positive for abdominal pain, constipation, nausea and vomiting. Negative for diarrhea and heartburn.  Genitourinary: Negative for dysuria and hematuria.  Musculoskeletal: Negative for back pain and joint pain.  Skin: Negative for rash.  Neurological: Positive for dizziness. Negative for sensory change, speech change, focal weakness and headaches.  Endo/Heme/Allergies: Does not bruise/bleed easily.  Psychiatric/Behavioral: Negative for depression. The patient is not nervous/anxious.     DRUG ALLERGIES:   Allergies  Allergen Reactions  . Atorvastatin Itching  . Metformin Diarrhea  . Hydrocodone-Acetaminophen Itching and Rash    VITALS:  Blood pressure 113/73, pulse (!) 57, temperature 97.8 F (36.6 C), temperature source Oral, resp. rate 18, height 5\' 8"  (1.727 m), weight 85.2 kg (187 lb 14.4 oz), SpO2 96 %.  PHYSICAL EXAMINATION:   Physical Exam  GENERAL:  82 y.o.-year-old patient lying in the bed with no acute distress. Decreased hearing EYES: Pupils equal, round, reactive to light and accommodation. No scleral icterus. Extraocular muscles intact.  HEENT: Head atraumatic, normocephalic. Oropharynx and nasopharynx clear.  NECK:  Supple, no  jugular venous distention. No thyroid enlargement, no tenderness.  LUNGS: Normal breath sounds bilaterally, no wheezing, rales, rhonchi. No use of accessory muscles of respiration.  CARDIOVASCULAR: S1, S2 normal. No murmurs, rubs, or gallops.  ABDOMEN: Soft, nondistended. Bowel sounds present. No organomegaly or mass. Epigastric and left upper quadrant area tenderness EXTREMITIES: No cyanosis, clubbing or edema b/l.    NEUROLOGIC: Cranial nerves II through XII are intact. No focal Motor or sensory deficits b/l.   PSYCHIATRIC: The patient is alert and oriented x 3.  SKIN: No obvious rash, lesion, or ulcer.   LABORATORY PANEL:   CBC  Recent Labs Lab 07/30/16 0829  WBC 4.5  HGB 12.3*  HCT 36.9*  PLT 37*   ------------------------------------------------------------------------------------------------------------------ Chemistries   Recent Labs Lab 07/28/16 1121 07/29/16 0400  NA 139 142  K 4.2 3.8  CL 107 110  CO2 25 27  GLUCOSE 122* 100*  BUN 17 14  CREATININE 0.83 0.86  CALCIUM 9.1 8.6*  AST 122*  --   ALT 56  --   ALKPHOS 425*  --   BILITOT 1.9*  --    ------------------------------------------------------------------------------------------------------------------  Cardiac Enzymes  Recent Labs Lab 07/28/16 1121  TROPONINI <0.03   ------------------------------------------------------------------------------------------------------------------  RADIOLOGY:  Dg Abd 2 Views  Result Date: 07/29/2016 CLINICAL DATA:  Nausea vomiting and abdominal pain EXAM: ABDOMEN - 2 VIEW COMPARISON:  03/25/2016 FINDINGS: Aortic stent graft and IVC filter are again noted and stable. No free air is seen. Scattered large and small bowel gas is noted. Fecal material is noted throughout the colon consistent with a mild degree of constipation. Degenerative changes of the lumbar spine and hip joints are seen. IMPRESSION: Mild constipation.  No acute  abnormality noted. Electronically  Signed   By: Inez Catalina M.D.   On: 07/29/2016 16:01     ASSESSMENT AND PLAN:   * Orthostatic syncope Due to dehydration from poor appetite and nausea. Improved with IV fluids Advised patient to take time from laying to sitting and sitting to standing positions.  * Nausea and vomiting with abdominal pain This is likely due to worsening pancreatic cancer which is not being treated. X-ray showed constipation. Zofran and Reglan IV when necessary  * Acutely worsening chronic thrombocytopenia Etiology is unclear. Could be due to malignancy. We will stop Coumadin. Consult oncology.  * Diabetes mellitus type 2 with hypoglycemic episodes Due to decreased oral intake Stop glipizide. Reduce dose of Lantus.  * Mild sinus bradycardia is chronic and unchanged  * History of DVT/PE with IVC filter Coumadin being stopped due to thrombocytopenia  * DVT prophylaxis SCDs  * Pancreatic adenocarcinoma Discussed with Dr. Grayland Ormond.   Discussed with [facility] of palliative care. Patient will be going home tomorrow once hospice services set up.  All the records are reviewed and case discussed with Care Management/Social Workerr. Management plans discussed with the patient, family and they are in agreement.  CODE STATUS: FULL CODE  DVT Prophylaxis: SCDs  TOTAL TIME TAKING CARE OF THIS PATIENT: 25 minutes.    Hillary Bow R M.D on 07/30/2016 at 2:28 PM  Between 7am to 6pm - Pager - [phone]  After 6pm go to [email or web address] - password [email or web address] [place] [facility]  Office  [phone]  CC: Primary care physician; Ezequiel Kayser, MD  Note: This dictation was prepared with Dragon dictation along with smaller phrase technology. Any transcriptional errors that result from this process are unintentional.

## 2016-07-30 NOTE — Consult Note (Signed)
Consultation Note Date: 07/30/2016   Patient Name: Kenneth Curry  DOB: 1933-10-12  MRN: [id number]  Age / Sex: 82 y.o., male  PCP: Ezequiel Kayser, MD Referring Physician: Hillary Bow, MD  Reason for Consultation: Establishing goals of care and Psychosocial/spiritual support  HPI/Patient Profile: 82 y.o. male   admitted on 07/28/2016 with   known history of Abdominal aortic aneurysm, diabetes type 2, pancreatic adenocarcinoma, phimosis, history of DVT, IVC filter placement and chronic anticoagulation use with Coumadin- lives at home and made a walker.  Admitted with syncopal episodes and falls.    In ER a CT scan of the head was negative but he was noted to have slight bradycardia on telemetry and so given his admission for further monitoring for syncopal episode.  Family faces advanced directive decisions and anticipatory care needs.    Clinical Assessment and Goals of Care:  This NP Wadie Lessen reviewed medical records, received report from team, assessed the patient and then meet at the patient's bedside along with his wife and daughter to discuss diagnosis, prognosis, GOC, EOL wishes disposition and options.  A detailed discussion was had today regarding advanced directives.  Concepts specific to code status, artifical feeding and hydration, continued IV antibiotics and rehospitalization was had.  The difference between a aggressive medical intervention path  and a palliative comfort care path for this patient at this time was had.  Values and goals of care important to patient and family were attempted to be elicited.  MOST form completed  Concept of Hospice and Palliative Care were discussed  Natural trajectory and expectations at EOL were discussed.  Questions and concerns addressed.   Family encouraged to call with questions or concerns.  PMT will continue to support holistically.   SUMMARY OF  RECOMMENDATIONS    Code Status/Advance Care Planning:  DNR   Symptom Management:   Pain: Roxanol 5 mg po/sl every 2 hrs prn  Palliative Prophylaxis:   Bowel Regimen, Delirium Protocol, Frequent Pain Assessment and Oral Care  Additional Recommendations (Limitations, Scope, Preferences):  Full Comfort Care  Psycho-social/Spiritual:     Additional Recommendations: Education on Hospice  Prognosis:   < 6 months  Discharge Planning: Home with Hospice      Primary Diagnoses: Present on Admission: **None**   I have reviewed the medical record, interviewed the patient and family, and examined the patient. The following aspects are pertinent.  Past Medical History:  Diagnosis Date  . Abdominal aortic aneurysm ([place])   . Cellulitis   . Diabetes mellitus, type 2 ([place])   . Elevated cholesterol   . Hematuria   . History of primary bladder cancer   . Pancreatic adenocarcinoma ([place]) 01/16/2016  . Phimosis   . Presence of IVC filter   . Pulmonary nodules   . Thrombocytopenia ([place])   . Urinary frequency    Social History   Social History  . Marital status: Married    Spouse name: N/A  . Number of children: N/A  . Years of education: N/A  Social History Main Topics  . Smoking status: Former Smoker    Packs/day: 1.00    Types: Cigarettes    Quit date: 12/28/2015  . Smokeless tobacco: Never Used  . Alcohol use No  . Drug use: No  . Sexual activity: Not Asked   Other Topics Concern  . None   Social History Narrative  . None   Family History  Problem Relation Age of Onset  . Cancer Mother 32    Kidney  . Lung cancer Brother   . Colon cancer Brother   . Cancer Sister     Renal  . Cancer Paternal Uncle     melanoma   Scheduled Meds: . calcium-vitamin D  1 tablet Oral BID  . feeding supplement (ENSURE ENLIVE)  237 mL Oral BID BM  . fentaNYL  50 mcg Transdermal Q72H  . insulin aspart  0-9 Units Subcutaneous TID WC  . insulin glargine  22 Units  Subcutaneous QHS  . levothyroxine  150 mcg Oral QAC breakfast  . pantoprazole  40 mg Oral QAC breakfast  . senna-docusate  2 tablet Oral BID   Continuous Infusions:  PRN Meds:.metoCLOPramide (REGLAN) injection, ondansetron (ZOFRAN) IV, oxyCODONE, polyethylene glycol Medications Prior to Admission:  Prior to Admission medications   Medication Sig Start Date End Date Taking? Authorizing Provider  Calcium Carbonate-Vitamin D (CALCIUM 600+D) 600-400 MG-UNIT tablet Take 1 tablet by mouth 2 (two) times daily.   Yes Historical Provider, MD  fentaNYL (DURAGESIC - DOSED MCG/HR) 50 MCG/HR Place 1 patch (50 mcg total) onto the skin every 3 (three) days. 07/04/16  Yes Lloyd Huger, MD  glimepiride (AMARYL) 4 MG tablet Take 4 mg by mouth daily.    Yes Historical Provider, MD  levothyroxine (SYNTHROID, LEVOTHROID) 150 MCG tablet Take 150 mcg by mouth daily before breakfast.    Yes Historical Provider, MD  omeprazole (PRILOSEC) 20 MG capsule Take 1 capsule (20 mg total) by mouth 2 (two) times daily before a meal. 01/28/16  Yes Evlyn Kanner, NP  oxyCODONE (OXY IR/ROXICODONE) 5 MG immediate release tablet Take 1 tablet (5 mg total) by mouth every 4 (four) hours as needed for severe pain. 07/03/16  Yes Lloyd Huger, MD  promethazine (PHENERGAN) 25 MG tablet Take 25 mg by mouth every 6 (six) hours as needed for nausea or vomiting.    Yes Historical Provider, MD  warfarin (COUMADIN) 5 MG tablet Take 5 mg by mouth every evening.    Yes Historical Provider, MD  insulin glargine (LANTUS) 100 unit/mL SOPN Inject 0.2 mLs (20 Units total) into the skin at bedtime. 07/30/16   Srikar Sudini, MD  ondansetron (ZOFRAN) 4 MG tablet Take 1 tablet (4 mg total) by mouth every 8 (eight) hours as needed for nausea or vomiting. 06/05/16   Lloyd Huger, MD  senna-docusate (SENOKOT-S) 8.6-50 MG tablet Take 1 tablet by mouth 2 (two) times daily. 07/30/16   Hillary Bow, MD   Allergies  Allergen Reactions  .  Atorvastatin Itching  . Metformin Diarrhea  . Hydrocodone-Acetaminophen Itching and Rash   Review of Systems  Constitutional: Positive for activity change.  Neurological: Positive for weakness.    Physical Exam  Constitutional: He appears lethargic. He appears cachectic. He appears ill.  HENT:  Mouth/Throat: Oropharynx is clear and moist.  Cardiovascular: Normal rate, regular rhythm and normal heart sounds.   Pulmonary/Chest: He has decreased breath sounds in the right lower field and the left lower field.  Abdominal: He exhibits distension.  Musculoskeletal:  -generalized weakness  Neurological: He appears lethargic.  Skin: Skin is warm and dry.    Vital Signs: BP 113/73 (BP Location: Right Arm)   Pulse (!) 57   Temp 97.8 F (36.6 C) (Oral)   Resp 18   Ht 5\' 8"  (1.727 m)   Wt 85.2 kg (187 lb 14.4 oz)   SpO2 96%   BMI 28.57 kg/m  Pain Assessment: 0-10   Pain Score: 0-No pain   SpO2: SpO2: 96 % O2 Device:SpO2: 96 % O2 Flow Rate: .   IO: Intake/output summary:  Intake/Output Summary (Last 24 hours) at 07/30/16 1409 Last data filed at 07/30/16 1300  Gross per 24 hour  Intake              480 ml  Output              376 ml  Net              104 ml    LBM: Last BM Date: 07/29/16 Baseline Weight: Weight: 79.4 kg (175 lb) Most recent weight: Weight: 85.2 kg (187 lb 14.4 oz)      Palliative Assessment/Data:  40 % at best   Flowsheet Rows   Flowsheet Row Most Recent Value  Intake Tab  Referral Department  Hospitalist  Unit at Time of Referral  Cardiac/Telemetry Unit  Palliative Care Primary Diagnosis  Cancer  Date Notified  07/30/16  Palliative Care Type  New Palliative care  Reason for referral  Clarify Goals of Care  Date of Admission  07/28/16  # of days IP prior to Palliative referral  2  Clinical Assessment  Psychosocial & Spiritual Assessment  Palliative Care Outcomes     Discussed wityh Dr Darvin Neighbours  Time In: 1215 Time Out: 1330 Time Total: 75  min Greater than 50%  of this time was spent counseling and coordinating care related to the above assessment and plan.  Signed by: Wadie Lessen, NP   Please contact Palliative Medicine Team phone at [phone] for questions and concerns.  For individual provider: See Shea Evans

## 2016-07-30 NOTE — Care Management (Signed)
Spoke with wife. Discussed hospice services and she prefers [facility]. Referral to [facility] with Hospice.

## 2016-07-30 NOTE — Progress Notes (Signed)
Physical Therapy Treatment Patient Details Name: Kenneth Curry MRN: [id number] DOB: Sep 01, 1933 Today's Date: 07/30/2016    History of Present Illness is a 82 y.o. male with a known history of Abdominal aortic aneurysm, diabetes type 2, pancreatic adenocarcinoma, phimosis, history of DVT, IVC filter placement and chronic anticoagulation use with Coumadin.  For last 2 days he is feeling dizzy most of the time when trying to stand up and he had 2 syncopal episodes on Saturday when he had minor falls without any major injuries, and one episode today morning. Every time 5 was present in the house and she noted he had syncopal episode just for a few seconds and he is back to his baseline within few seconds of having a fall.    PT Comments    Pt in bed ready for session.  Pt transitioned out of bed with rail.  Pt sat edge of bed before standing and stood before walking with walker to bathroom.  He was given significant time on toilet with reports of constipation but no success.  He was then able to ambulate around unit x 2 with walker and min guard.  No loss of balance noted.  He did not complain of dizziness stating "I'm fine" but was fatigued with gait.     Follow Up Recommendations  Home health PT     Equipment Recommendations  Rolling walker with 5" wheels    Recommendations for Other Services       Precautions / Restrictions Precautions Precautions: Fall Restrictions Weight Bearing Restrictions: No    Mobility  Bed Mobility Overal bed mobility: Independent                Transfers Overall transfer level: Needs assistance Equipment used: Rolling walker (2 wheeled) Transfers: Sit to/from Stand Sit to Stand: Min guard            Ambulation/Gait Ambulation/Gait assistance: Min guard Ambulation Distance (Feet): 380 Feet Assistive device: Rolling walker (2 wheeled) Gait Pattern/deviations: Step-through pattern   Gait velocity interpretation: <1.8 ft/sec, indicative  of risk for recurrent falls General Gait Details: no c/o dizziness with gait   Stairs            Wheelchair Mobility    Modified Rankin (Stroke Patients Only)       Balance Overall balance assessment: Needs assistance Sitting-balance support: Feet supported Sitting balance-Leahy Scale: Normal     Standing balance support: Bilateral upper extremity supported Standing balance-Leahy Scale: Fair                      Cognition Arousal/Alertness: Awake/alert Behavior During Therapy: WFL for tasks assessed/performed Overall Cognitive Status: Within Functional Limits for tasks assessed                      Exercises      General Comments        Pertinent Vitals/Pain Pain Assessment: No/denies pain    Home Living                      Prior Function            PT Goals (current goals can now be found in the care plan section) Acute Rehab PT Goals Patient Stated Goal: To get stronger    Frequency  Min 2X/week    PT Plan Current plan remains appropriate    Co-evaluation  End of Session Equipment Utilized During Treatment: Gait belt   Patient left: in chair;with call bell/phone within reach;with chair alarm set;with family/visitor present     Time: 1030-1053 PT Time Calculation (min) (ACUTE ONLY): 23 min  Charges:  $Gait Training: 8-22 mins $Therapeutic Activity: 8-22 mins                    G Codes:      Chesley Noon August 15, 2016, 11:31 AM

## 2016-07-30 NOTE — Care Management Note (Signed)
Case Management Note  Patient Details  Name: Kenneth Curry MRN: [id number] Date of Birth: 1932-12-20  Subjective/Objective:  Met with spouse and daughter to discussed PT recommendations of home PT. Would also benefit from SN and HHA. Wife is agreeable and has used [facility] in the past.    Action/Plan: Referral to [facility] for SN, PT and HHA.                   Expected Discharge Date:  07/30/16               Expected Discharge Plan:  [place]  In-House Referral:     Discharge planning Services  CM Consult  Post Acute Care Choice:  Home Health Choice offered to:  Spouse  DME Arranged:    DME Agency:     HH Arranged:  RN, PT, Nurse's Aide [facility] Agency:  [facility]  Status of Service:  In process, will continue to follow  If discussed at Long Length of Stay Meetings, dates discussed:    Additional Comments:  Jolly Mango, RN 07/30/2016, 10:27 AM

## 2016-07-30 NOTE — Clinical Social Work Note (Signed)
MSW received referral for SNF.  Case discussed with case manager and plan is to discharge home with home health.  MSW to sign off please re-consult if social work needs arise.  Kayda Allers R. Jillann Charette, MSW Mon-Fri 8a-4:30p [phone]   

## 2016-07-30 NOTE — Progress Notes (Signed)
Discussed with patient regarding goals of care with wife and daughter at bedside.  Patient has refused chemotherapy and also with his low platelets and tumor progression was not thought to be candidate for chemotherapy by Dr. Grayland Ormond.  On discussing regarding CODE STATUS patient did not want ventilatory support. I did explain to him regarding CPR and prognosis post resuscitation and that it would not help with this entity cancer which will continue to progress. After discussing with family patient has decided to change his CODE STATUS to DO NOT RESUSCITATE. Order is entered.  He is hopeful he will do well for the time he has left. But understands that his cancer will get worse and tells me he is ready to die and the Reita Cliche calls him.  Palliative care consult placed. Case also discussed with Dr. Grayland Ormond off oncology.  Total time spent 20 minutes on discussing with patient and family

## 2016-08-01 ENCOUNTER — Other Ambulatory Visit: Payer: Commercial Managed Care - HMO

## 2016-08-01 ENCOUNTER — Telehealth: Payer: Self-pay | Admitting: Oncology

## 2016-08-01 NOTE — Telephone Encounter (Signed)
Patient's wife lvm asking if she could get an appt for pt with Dr. Grayland Ormond next Friday. She did not give details in vm and an attempted callback was unsuccessful. Grayland Ormond is not on call next Friday so we do have space on the schedule after lunch. I lvm asking her to please call again with more detail about what is going on. I will transfer to triage nurse as necessary.

## 2016-08-02 NOTE — Discharge Summary (Signed)
[facility] [place] NAME: Kenneth Curry    MR#:  [id number]  DATE OF BIRTH:  1933/06/25  DATE OF ADMISSION:  07/28/2016 ADMITTING PHYSICIAN: Vaughan Basta, MD  DATE OF DISCHARGE: 07/30/2016  5:04 PM  PRIMARY CARE PHYSICIAN: Ezequiel Kayser, MD   ADMISSION DIAGNOSIS:  Syncope and collapse [R55]  DISCHARGE DIAGNOSIS:  Principal Problem:   Syncopal episodes Active Problems:   DNR (do not resuscitate)   Palliative care by specialist   Weakness generalized   SECONDARY DIAGNOSIS:   Past Medical History:  Diagnosis Date  . Abdominal aortic aneurysm ([place])   . Cellulitis   . Diabetes mellitus, type 2 ([place])   . Elevated cholesterol   . Hematuria   . History of primary bladder cancer   . Pancreatic adenocarcinoma ([place]) 01/16/2016  . Phimosis   . Presence of IVC filter   . Pulmonary nodules   . Thrombocytopenia ([place])   . Urinary frequency      ADMITTING HISTORY  HISTORY OF PRESENT ILLNESS: Kenneth Curry  is a 82 y.o. male with a known history of Abdominal aortic aneurysm, diabetes type 2, pancreatic adenocarcinoma, phimosis, history of DVT, IVC filter placement and chronic anticoagulation use with Coumadin- lives at home and made a walker. For last 2 days he is feeling dizzy most of the time when trying to stand up and he had 2 syncopal episodes on Saturday when he had minor falls without any major injuries, and one episode today morning. Every time 5 was present in the house and she noted he had syncopal episode just for a few seconds and he is back to his baseline within few seconds of having a fall. In ER a CT scan of the head was negative but he was noted to have slight bradycardia on telemetry and so given his admission for further monitoring for syncopal episode.   HOSPITAL COURSE:   * Orthostatic syncope Due to dehydration from poor appetite and nausea. Improved with IV fluids Advised patient to take time from  laying to sitting and sitting to standing positions.  * Nausea and vomiting with abdominal pain This is likely due to worsening pancreatic cancer which is not being treated. X-ray showed constipation. Patient does have nausea at time of discharge but no further vomiting.  * Acutely worsening chronic thrombocytopenia due to worsening cancer. Oncology has seen the patient in the hospital.  * Diabetes mellitus type 2 with hypoglycemic episodes Due to decreased oral intake Stop glipizide. Reduce dose of Lantus.  * Mild sinus bradycardia is chronic and unchanged  * History of DVT/PE with IVC filter Coumadin being stopped due to thrombocytopenia. Platelets at 37,000.  * DVT prophylaxis SCDs in the hospital.  Patient was seen by palliative care during the hospital stay. His CODE STATUS presently is DO NOT RESUSCITATE and DO NOT INTUBATE. Hospice services set up. Patient is not on any treatment for his pancreatic cancer but seems to be slowly worsening. Poor prognosis.  Patient is discharged home with hospice services to follow-up with oncology Dr. Grayland Ormond as outpatient.  CONSULTS OBTAINED:  Treatment Team:  Lloyd Huger, MD  DRUG ALLERGIES:   Allergies  Allergen Reactions  . Atorvastatin Itching  . Metformin Diarrhea  . Hydrocodone-Acetaminophen Itching and Rash    DISCHARGE MEDICATIONS:   Discharge Medication List as of 07/30/2016  4:23 PM    START taking these medications   Details  senna-docusate (SENOKOT-S) 8.6-50 MG tablet Take 1 tablet  by mouth 2 (two) times daily., Starting Wed 07/30/2016, Normal      CONTINUE these medications which have CHANGED   Details  insulin glargine (LANTUS) 100 unit/mL SOPN Inject 0.2 mLs (20 Units total) into the skin at bedtime., Starting Wed 07/30/2016, No Print      CONTINUE these medications which have NOT CHANGED   Details  Calcium Carbonate-Vitamin D (CALCIUM 600+D) 600-400 MG-UNIT tablet Take 1 tablet by mouth 2 (two)  times daily., Historical Med    fentaNYL (DURAGESIC - DOSED MCG/HR) 50 MCG/HR Place 1 patch (50 mcg total) onto the skin every 3 (three) days., Starting Fri 07/04/2016, Print    levothyroxine (SYNTHROID, LEVOTHROID) 150 MCG tablet Take 150 mcg by mouth daily before breakfast. , Historical Med    omeprazole (PRILOSEC) 20 MG capsule Take 1 capsule (20 mg total) by mouth 2 (two) times daily before a meal., Starting Mon 01/28/2016, Phone In    oxyCODONE (OXY IR/ROXICODONE) 5 MG immediate release tablet Take 1 tablet (5 mg total) by mouth every 4 (four) hours as needed for severe pain., Starting Thu 07/03/2016, Print    promethazine (PHENERGAN) 25 MG tablet Take 25 mg by mouth every 6 (six) hours as needed for nausea or vomiting. , Historical Med    ondansetron (ZOFRAN) 4 MG tablet Take 1 tablet (4 mg total) by mouth every 8 (eight) hours as needed for nausea or vomiting., Starting Thu 06/05/2016, Normal      STOP taking these medications     glimepiride (AMARYL) 4 MG tablet      warfarin (COUMADIN) 5 MG tablet         Today   VITAL SIGNS:  Blood pressure 113/73, pulse (!) 57, temperature 97.8 F (36.6 C), temperature source Oral, resp. rate 18, height 5\' 8"  (1.727 m), weight 85.2 kg (187 lb 14.4 oz), SpO2 96 %.  I/O:  No intake or output data in the 24 hours ending 08/02/16 1647  PHYSICAL EXAMINATION:  Physical Exam  GENERAL:  82 y.o.-year-old patient lying in the bed with no acute distress.  LUNGS: Normal breath sounds bilaterally, no wheezing, rales,rhonchi or crepitation. No use of accessory muscles of respiration.  CARDIOVASCULAR: S1, S2 normal. No murmurs, rubs, or gallops.  ABDOMEN: Soft, non-tender, non-distended. Bowel sounds present. No organomegaly or mass.  NEUROLOGIC: Moves all 4 extremities. PSYCHIATRIC: The patient is alert and oriented x 3.  SKIN: No obvious rash, lesion, or ulcer.   DATA REVIEW:   CBC  Recent Labs Lab 07/30/16 0829  WBC 4.5  HGB 12.3*  HCT  36.9*  PLT 37*    Chemistries   Recent Labs Lab 07/28/16 1121 07/29/16 0400  NA 139 142  K 4.2 3.8  CL 107 110  CO2 25 27  GLUCOSE 122* 100*  BUN 17 14  CREATININE 0.83 0.86  CALCIUM 9.1 8.6*  AST 122*  --   ALT 56  --   ALKPHOS 425*  --   BILITOT 1.9*  --     Cardiac Enzymes  Recent Labs Lab 07/28/16 1121  TROPONINI <0.03    Microbiology Results  Results for orders placed or performed during the hospital encounter of 03/25/16  Culture, blood (routine x 2)     Status: None   Collection Time: 03/25/16  6:25 PM  Result Value Ref Range Status   Specimen Description BLOOD LEFT ASSIST CONTROL  Final   Special Requests   Final    BOTTLES DRAWN AEROBIC AND ANAEROBIC [place] AERO 8CC ANA  Culture NO GROWTH 5 DAYS  Final   Report Status 03/30/2016 FINAL  Final  Culture, blood (routine x 2)     Status: Abnormal   Collection Time: 03/25/16  6:30 PM  Result Value Ref Range Status   Specimen Description BLOOD RIGHT ASSIST CONTROL  Final   Special Requests BOTTLES DRAWN AEROBIC AND ANAEROBIC 8CC  Final   Culture  Setup Time   Final    GRAM POSITIVE COCCI ANAEROBIC BOTTLE ONLY CRITICAL RESULT CALLED TO, READ BACK BY AND VERIFIED WITH: MATT MCBANE AT 2148 03/26/2016 BY TFK CONFIRMED BY MLZ    Culture (A)  Final    VIRIDANS STREPTOCOCCUS ANAEROBIC BOTTLE ONLY THE SIGNIFICANCE OF ISOLATING THIS ORGANISM FROM A SINGLE VENIPUNCTURE CANNOT BE PREDICTED WITHOUT FURTHER CLINICAL AND CULTURE CORRELATION. SUSCEPTIBILITIES AVAILABLE ONLY ON REQUEST.    Report Status 03/30/2016 FINAL  Final  Blood Culture ID Panel (Reflexed)     Status: Abnormal   Collection Time: 03/25/16  6:30 PM  Result Value Ref Range Status   Enterococcus species NOT DETECTED NOT DETECTED Final   Vancomycin resistance NOT DETECTED NOT DETECTED Final   Listeria monocytogenes NOT DETECTED NOT DETECTED Final   Staphylococcus species NOT DETECTED NOT DETECTED Final   Staphylococcus aureus NOT DETECTED NOT  DETECTED Final   Methicillin resistance NOT DETECTED NOT DETECTED Final   Streptococcus species DETECTED (A) NOT DETECTED Final    Comment: CRITICAL RESULT CALLED TO, READ BACK BY AND VERIFIED WITH: MATT MCBANE AT 2148 03/26/2016 BY TFK    Streptococcus agalactiae NOT DETECTED NOT DETECTED Final   Streptococcus pneumoniae NOT DETECTED NOT DETECTED Final   Streptococcus pyogenes NOT DETECTED NOT DETECTED Final   Acinetobacter baumannii NOT DETECTED NOT DETECTED Final   Enterobacteriaceae species NOT DETECTED NOT DETECTED Final   Enterobacter cloacae complex NOT DETECTED NOT DETECTED Final   Escherichia coli NOT DETECTED NOT DETECTED Final   Klebsiella oxytoca NOT DETECTED NOT DETECTED Final   Klebsiella pneumoniae NOT DETECTED NOT DETECTED Final   Proteus species NOT DETECTED NOT DETECTED Final   Serratia marcescens NOT DETECTED NOT DETECTED Final   Carbapenem resistance NOT DETECTED NOT DETECTED Final   Haemophilus influenzae NOT DETECTED NOT DETECTED Final   Neisseria meningitidis NOT DETECTED NOT DETECTED Final   Pseudomonas aeruginosa NOT DETECTED NOT DETECTED Final   Candida albicans NOT DETECTED NOT DETECTED Final   Candida glabrata NOT DETECTED NOT DETECTED Final   Candida krusei NOT DETECTED NOT DETECTED Final   Candida parapsilosis NOT DETECTED NOT DETECTED Final   Candida tropicalis NOT DETECTED NOT DETECTED Final    RADIOLOGY:  No results found.  Follow up with PCP in 1 week.  Management plans discussed with the patient, family and they are in agreement.  CODE STATUS:  Code Status History    Date Active Date Inactive Code Status Order ID Comments User Context   07/30/2016  2:12 PM 07/30/2016  8:09 PM DNR [id number]  Hillary Bow, MD Inpatient   07/28/2016  4:14 PM 07/30/2016  2:12 PM Full Code [id number]  Vaughan Basta, MD Inpatient   03/26/2016 12:25 AM 03/27/2016  3:34 PM Full Code [id number]  Lance Coon, MD Inpatient   01/17/2016  5:51 PM 01/19/2016  6:07 PM Full  Code [id number]  Henreitta Leber, MD Inpatient    Questions for Most Recent Historical Code Status (Order [id number])    Question Answer Comment   In the event of cardiac or respiratory ARREST Do not call a "code blue"  In the event of cardiac or respiratory ARREST Do not perform Intubation, CPR, defibrillation or ACLS    In the event of cardiac or respiratory ARREST Use medication by any route, position, wound care, and other measures to relive pain and suffering. May use oxygen, suction and manual treatment of airway obstruction as needed for comfort.         Advance Directive Documentation   Flowsheet Row Most Recent Value  Type of Advance Directive  Healthcare Power of Attorney  Pre-existing out of facility DNR order (yellow form or pink MOST form)  No data  "MOST" Form in Place?  No data      TOTAL TIME TAKING CARE OF THIS PATIENT ON DAY OF DISCHARGE: more than 30 minutes.   Hillary Bow R M.D on 08/02/2016 at 4:47 PM  Between 7am to 6pm - Pager - [phone]  After 6pm go to [email or web address] - password [email or web address] [facility]  Office  [phone]  CC: Primary care physician; Ezequiel Kayser, MD  Note: This dictation was prepared with Dragon dictation along with smaller phrase technology. Any transcriptional errors that result from this process are unintentional.

## 2016-08-04 NOTE — Telephone Encounter (Signed)
I spoke with patients wife, patient on Hospice at this time. Please cancel all future appointment for patient. Thank you.

## 2016-08-07 ENCOUNTER — Other Ambulatory Visit: Payer: Self-pay | Admitting: *Deleted

## 2016-08-07 DIAGNOSIS — K859 Acute pancreatitis, unspecified: Secondary | ICD-10-CM

## 2016-08-07 MED ORDER — OXYCODONE HCL 5 MG PO TABS: 5.0000 mg | ORAL_TABLET | ORAL | 0 refills | Status: DC | PRN

## 2016-08-08 ENCOUNTER — Inpatient Hospital Stay: Payer: Commercial Managed Care - HMO

## 2016-08-15 ENCOUNTER — Other Ambulatory Visit: Payer: Self-pay | Admitting: *Deleted

## 2016-08-15 DIAGNOSIS — C9110 Chronic lymphocytic leukemia of B-cell type not having achieved remission: Secondary | ICD-10-CM

## 2016-08-15 MED ORDER — FENTANYL 50 MCG/HR TD PT72: 50.0000 ug | MEDICATED_PATCH | TRANSDERMAL | 0 refills | Status: DC

## 2016-08-15 NOTE — Telephone Encounter (Signed)
Please fax this to [facility]

## 2016-08-15 NOTE — Telephone Encounter (Signed)
Faxed to [facility].

## 2016-08-22 ENCOUNTER — Inpatient Hospital Stay: Payer: Commercial Managed Care - HMO

## 2016-08-25 ENCOUNTER — Telehealth: Payer: Self-pay | Admitting: *Deleted

## 2016-08-25 DIAGNOSIS — C9110 Chronic lymphocytic leukemia of B-cell type not having achieved remission: Secondary | ICD-10-CM

## 2016-08-25 MED ORDER — FENTANYL 50 MCG/HR TD PT72: 50.0000 ug | MEDICATED_PATCH | TRANSDERMAL | 0 refills | Status: DC

## 2016-08-25 NOTE — Telephone Encounter (Signed)
Called to report that his fentanyl patch is wearing off after 2 days and inquiring if the dose can be increased or the patch be changed every 48 hours. Please advise

## 2016-08-25 NOTE — Telephone Encounter (Signed)
Hospice Triage Nurse Elyn Peers notified per VO Dr Grayland Ormond to change Fentanyl patch every 48 hours. THis was repeated back to me

## 2016-08-27 ENCOUNTER — Telehealth: Payer: Self-pay | Admitting: *Deleted

## 2016-08-27 ENCOUNTER — Other Ambulatory Visit: Payer: Commercial Managed Care - HMO

## 2016-08-27 ENCOUNTER — Encounter (INDEPENDENT_AMBULATORY_CARE_PROVIDER_SITE_OTHER): Payer: Self-pay

## 2016-08-27 DIAGNOSIS — C9110 Chronic lymphocytic leukemia of B-cell type not having achieved remission: Secondary | ICD-10-CM

## 2016-08-27 MED ORDER — FENTANYL 50 MCG/HR TD PT72: 50.0000 ug | MEDICATED_PATCH | TRANSDERMAL | 0 refills | Status: DC

## 2016-08-27 NOTE — Telephone Encounter (Signed)
Called to report he is still having uncontrolled nausea. I asked her to initiate her protocol which was signed and faxed back to Hospice. She then asked for a refill of his Fentanyl patches

## 2016-08-29 ENCOUNTER — Ambulatory Visit: Payer: Commercial Managed Care - HMO | Admitting: Oncology

## 2016-09-08 ENCOUNTER — Other Ambulatory Visit: Payer: Self-pay | Admitting: *Deleted

## 2016-09-08 MED ORDER — OXYCODONE HCL 5 MG PO TABS: 5.0000 mg | ORAL_TABLET | ORAL | 0 refills | Status: DC | PRN

## 2016-09-10 ENCOUNTER — Telehealth: Payer: Self-pay | Admitting: Urology

## 2016-09-10 NOTE — Telephone Encounter (Signed)
FYI I don't really know who to address this to since Dr. Elnoria Howard is no longer here, but this patient had a 1 year follow up with a cysto scheduled for November and I called to move the appt and his wife told me that hospice has been called in because he has pancreatic cancer and to cancel his appt.   I just wanted this in his chart.  Thanks,  Sharyn Lull

## 2016-09-17 ENCOUNTER — Other Ambulatory Visit: Payer: Self-pay | Admitting: *Deleted

## 2016-09-17 DIAGNOSIS — C9110 Chronic lymphocytic leukemia of B-cell type not having achieved remission: Secondary | ICD-10-CM

## 2016-09-17 MED ORDER — FENTANYL 50 MCG/HR TD PT72: 50.0000 ug | MEDICATED_PATCH | TRANSDERMAL | 0 refills | Status: DC

## 2016-09-24 ENCOUNTER — Telehealth: Payer: Self-pay | Admitting: *Deleted

## 2016-09-24 NOTE — Telephone Encounter (Signed)
Informed Colletta Maryland per VO Dr Grayland Ormond that this is not unexpected and that he will be jaundiced with his pancreatic cancer, keep O2 on all the time. She repeated back to me

## 2016-09-24 NOTE — Telephone Encounter (Signed)
Reports that patient is having SOB on exertion, O2 sats on 3 L/m O @ is 94%, diminished breath sounds RLL. Intermittent jaundice noted, has some pain in his RUQ of abd. Not asking for tests to be done.

## 2016-10-06 ENCOUNTER — Telehealth: Payer: Self-pay | Admitting: *Deleted

## 2016-10-06 MED ORDER — FUROSEMIDE 20 MG PO TABS: 20.0000 mg | ORAL_TABLET | Freq: Every day | ORAL | 2 refills | Status: AC | PRN

## 2016-10-06 NOTE — Telephone Encounter (Signed)
Reports that patient is having 3+ edema bil lower extremities and is requesting order for Lasix

## 2016-10-08 ENCOUNTER — Other Ambulatory Visit: Payer: Self-pay | Admitting: *Deleted

## 2016-10-08 DIAGNOSIS — C9110 Chronic lymphocytic leukemia of B-cell type not having achieved remission: Secondary | ICD-10-CM

## 2016-10-08 MED ORDER — FENTANYL 50 MCG/HR TD PT72: 50.0000 ug | MEDICATED_PATCH | TRANSDERMAL | 0 refills | Status: DC

## 2016-10-08 MED ORDER — OXYCODONE HCL 5 MG PO TABS: 5.0000 mg | ORAL_TABLET | ORAL | 0 refills | Status: DC | PRN

## 2016-10-21 ENCOUNTER — Telehealth: Payer: Self-pay | Admitting: *Deleted

## 2016-10-21 MED ORDER — FENTANYL 25 MCG/HR TD PT72: 25.0000 ug | MEDICATED_PATCH | TRANSDERMAL | 0 refills | Status: DC

## 2016-10-21 NOTE — Telephone Encounter (Signed)
Continues to have lower abd pain and is on Fentanyl 50 mcg q 49 hours and has doubled the amt of Oxycodone 5 he was taking form 2 tabs a day to 4 tabs a day because of the pain . Requesting an increase in his fentanyl dose. He has a box of 50 mcg on hand, please advise

## 2016-10-21 NOTE — Telephone Encounter (Signed)
Increase fentanyl to 57mcg q48hrs.

## 2016-10-22 ENCOUNTER — Ambulatory Visit: Payer: Commercial Managed Care - HMO

## 2016-10-22 ENCOUNTER — Other Ambulatory Visit: Payer: Commercial Managed Care - HMO

## 2016-10-27 ENCOUNTER — Other Ambulatory Visit: Payer: Self-pay | Admitting: *Deleted

## 2016-10-27 MED ORDER — OXYCODONE HCL 5 MG PO TABS: 5.0000 mg | ORAL_TABLET | ORAL | 0 refills | Status: DC | PRN

## 2016-10-27 MED ORDER — FENTANYL 75 MCG/HR TD PT72: 75.0000 ug | MEDICATED_PATCH | TRANSDERMAL | 0 refills | Status: DC

## 2016-11-04 ENCOUNTER — Other Ambulatory Visit: Payer: Self-pay | Admitting: *Deleted

## 2016-11-04 MED ORDER — ONDANSETRON HCL 8 MG PO TABS
8.0000 mg | ORAL_TABLET | ORAL | 1 refills | Status: AC | PRN
Start: 2016-11-04 — End: ?

## 2016-11-04 MED ORDER — FENTANYL 75 MCG/HR TD PT72: 75.0000 ug | MEDICATED_PATCH | TRANSDERMAL | 0 refills | Status: DC

## 2016-11-10 ENCOUNTER — Other Ambulatory Visit: Payer: Self-pay | Admitting: *Deleted

## 2016-11-10 MED ORDER — BACLOFEN 10 MG PO TABS: ORAL_TABLET | ORAL | 0 refills | Status: AC

## 2016-11-13 ENCOUNTER — Other Ambulatory Visit: Payer: Self-pay | Admitting: *Deleted

## 2016-11-13 MED ORDER — OXYCODONE HCL 5 MG PO TABS: 5.0000 mg | ORAL_TABLET | ORAL | 0 refills | Status: AC | PRN

## 2016-11-25 ENCOUNTER — Telehealth: Payer: Self-pay | Admitting: *Deleted

## 2016-11-25 ENCOUNTER — Other Ambulatory Visit: Payer: Self-pay | Admitting: *Deleted

## 2016-11-25 MED ORDER — FENTANYL 75 MCG/HR TD PT72: 75.0000 ug | MEDICATED_PATCH | TRANSDERMAL | 0 refills | Status: DC

## 2016-11-25 NOTE — Telephone Encounter (Signed)
Refill request for fentaNYL faxed to [facility].

## 2016-11-28 ENCOUNTER — Telehealth: Payer: Self-pay | Admitting: *Deleted

## 2016-11-28 NOTE — Telephone Encounter (Signed)
Hospice nurse called to notivy MD of patient fall on 11/25/2016. Patient has a small abrasion on his arm.

## 2016-12-11 ENCOUNTER — Other Ambulatory Visit: Payer: Self-pay | Admitting: *Deleted

## 2016-12-11 MED ORDER — FENTANYL 75 MCG/HR TD PT72: 75.0000 ug | MEDICATED_PATCH | TRANSDERMAL | 0 refills | Status: AC

## 2016-12-16 ENCOUNTER — Telehealth: Payer: Self-pay | Admitting: *Deleted

## 2016-12-16 MED ORDER — MORPHINE SULFATE (CONCENTRATE) 10 MG /0.5 ML PO SOLN: ORAL | 0 refills | Status: AC

## 2016-12-16 MED ORDER — HALOPERIDOL 5 MG PO TABS: ORAL_TABLET | ORAL | 1 refills | Status: AC

## 2016-12-16 NOTE — Telephone Encounter (Signed)
Per [place] Haldol 5 - 10 mg every 6 - 8 h Roxanol faxed, Colletta Maryland informed

## 2016-12-16 NOTE — Telephone Encounter (Signed)
ls needing Rf on Roxanol

## 2016-12-16 NOTE — Telephone Encounter (Signed)
He is having terminal restlessness and is not sleeping, they have tried Lorazepam, haldol, and Seroquel and nothing is working. Asking for suggestions of what to do, increase Seroquel to 50 mg or thorazine? He has fallem numerous times since the weekend and has bruising and skin tears. Please advise.

## 2016-12-22 ENCOUNTER — Telehealth: Payer: Self-pay | Admitting: *Deleted

## 2016-12-22 NOTE — Telephone Encounter (Signed)
Called to report that patient expired on 2017-01-19 @3 :50AM

## 2017-01-08 DEATH — deceased

## 2017-11-03 IMAGING — CT CT RENAL STONE PROTOCOL
2 of 4 series · 16 of 46 positions shown, 18 images · non-contrast
Comparison: 11/12/2009 abdominal and pelvic CT, which we have
limited images. 12/21/2010 radiographs and 10/24/2009 ultrasound

CLINICAL DATA: 82-year-old male with acute abdominal and pelvic
pain for 2 days. History of abdominal aortic aneurysm repair,
bladder cancer.

EXAM:
CT ABDOMEN AND PELVIS WITHOUT CONTRAST
TECHNIQUE: Multidetector CT imaging of the abdomen and pelvis was performed
following the standard protocol without IV contrast.

[Series 2: soft tissue · axial · 0.84mm/px · z∈[-775,-385]mm · 13 of 91 slices shown, 15 images]
[im 7/91  soft-tissue]
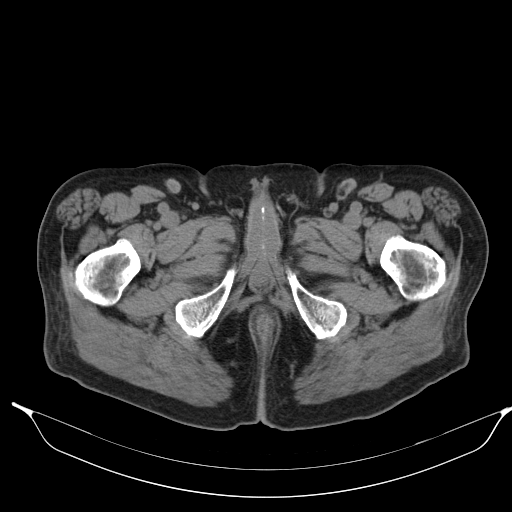
[im 7/91  bone]
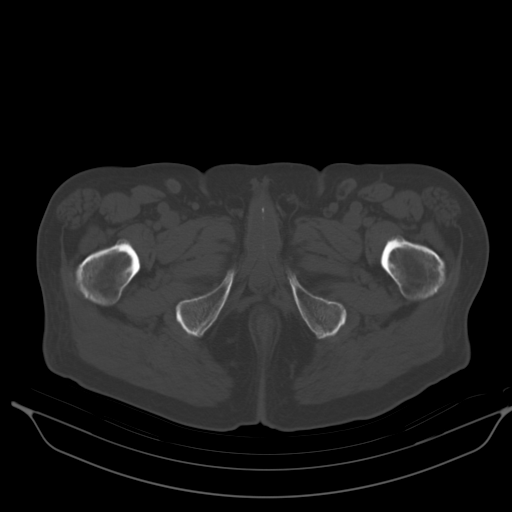
[im 13/91  soft-tissue]
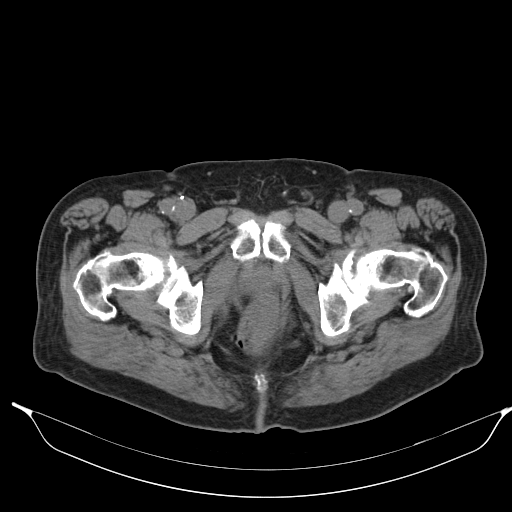
[im 19/91  soft-tissue]
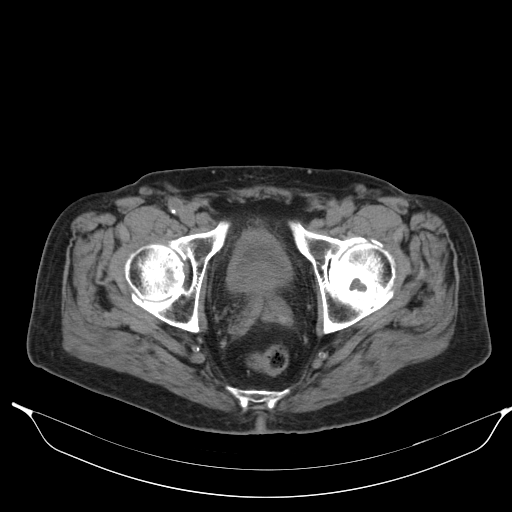
[im 25/91  soft-tissue]
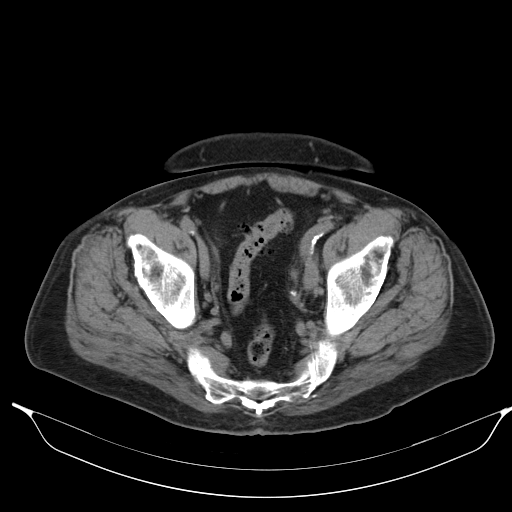
[im 34/91  soft-tissue]
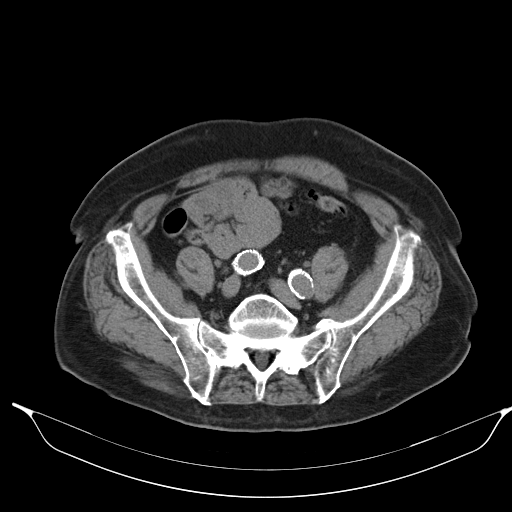
[im 40/91  soft-tissue]
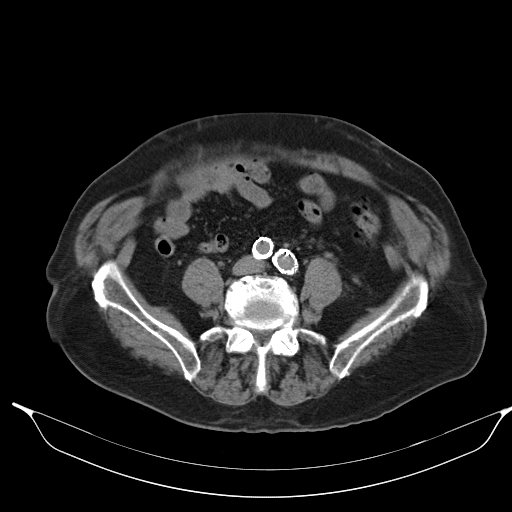
[im 46/91  soft-tissue]
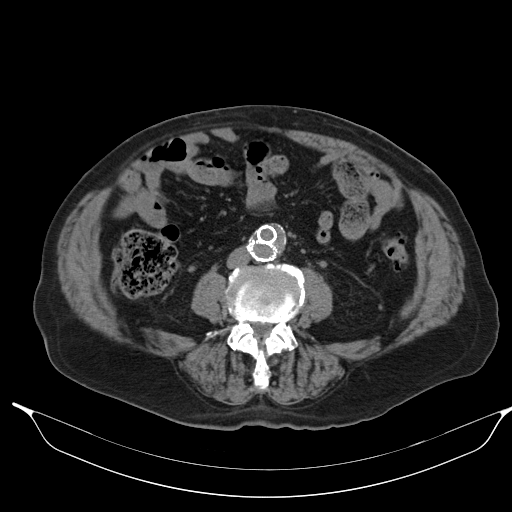
[im 52/91  soft-tissue]
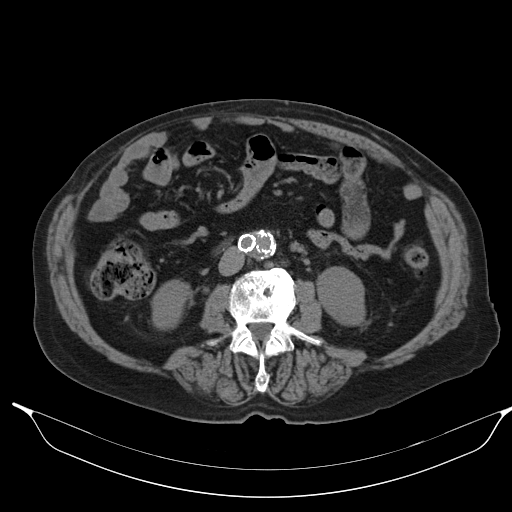
[im 58/91  soft-tissue]
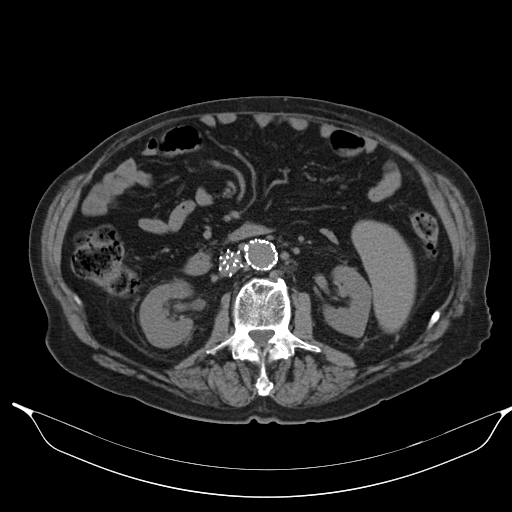
[im 58/91  bone]
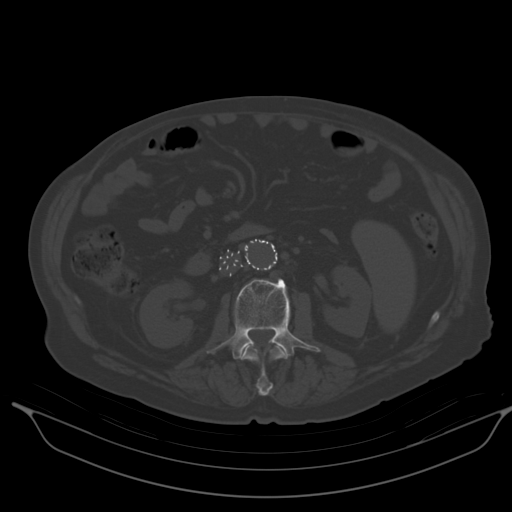
[im 67/91  soft-tissue]
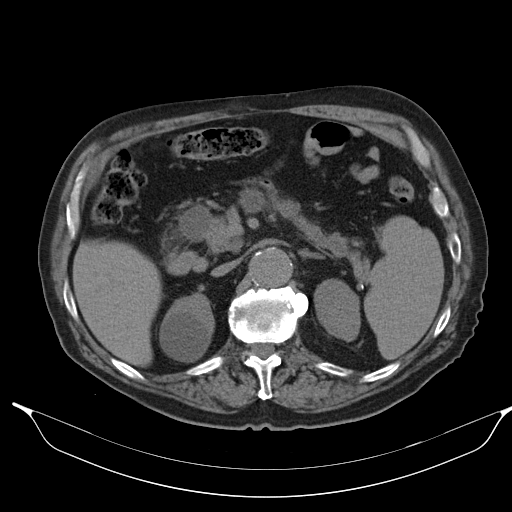
[im 73/91  soft-tissue]
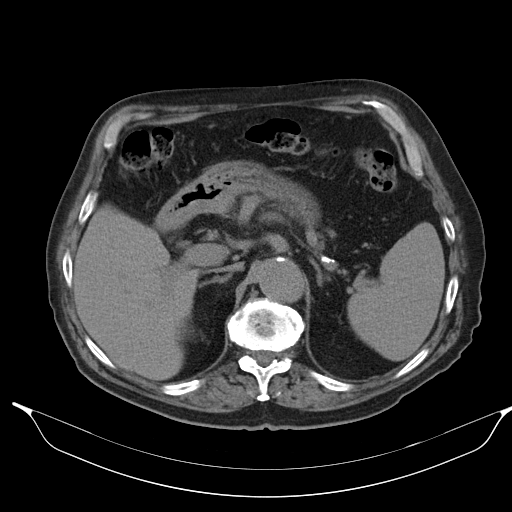
[im 79/91  soft-tissue]
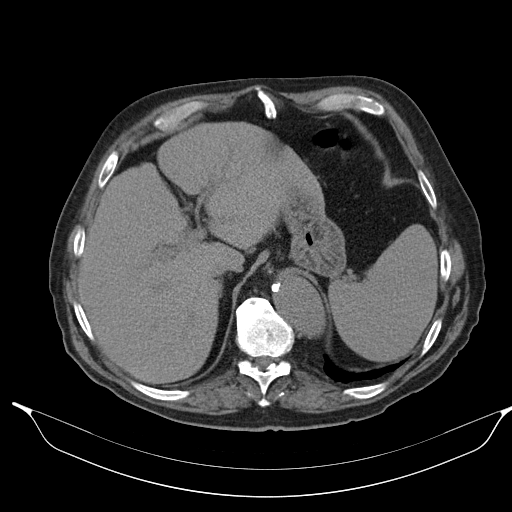
[im 85/91  soft-tissue]
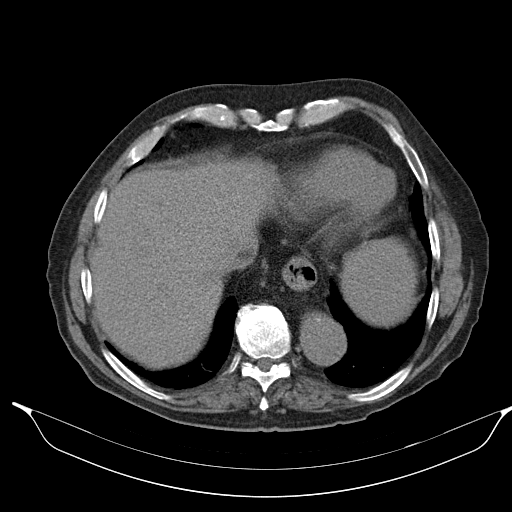

[Series 602: coronal · coronal · 0.91mm/px · 3 of 111 slices shown]
[im 37/111  soft-tissue]
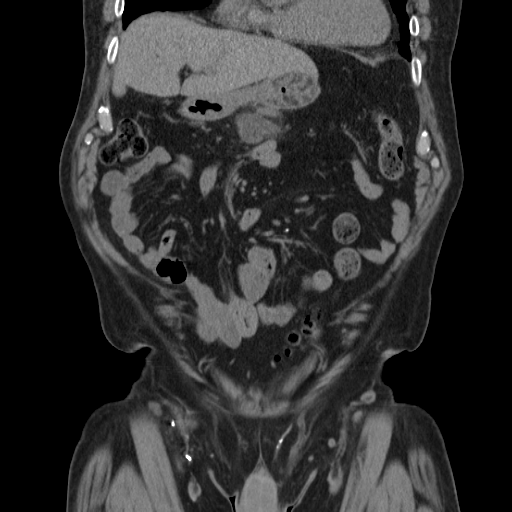
[im 49/111  soft-tissue]
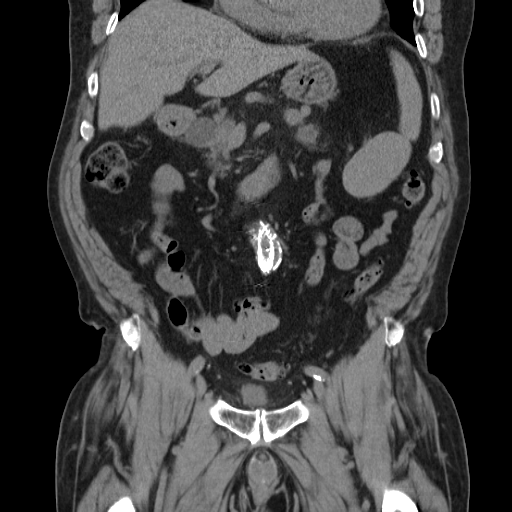
[im 62/111  soft-tissue]
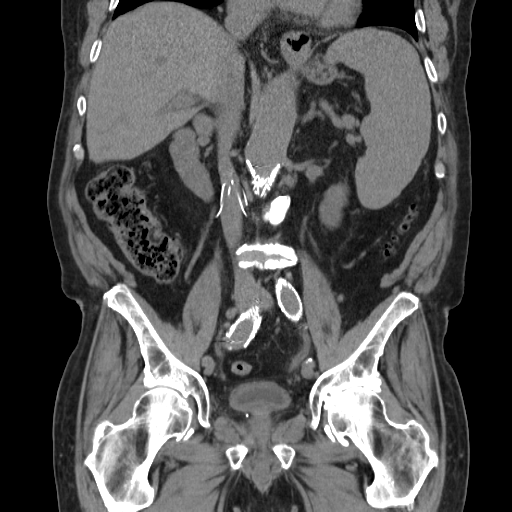

[16 of 46 positions shown; findings below may reference images not displayed]

FINDINGS: Please note that parenchymal abnormalities may be missed without
intravenous contrast.

Lower chest:  Cardiomegaly noted.

Hepatobiliary: The liver is unremarkable. The patient is status post
cholecystectomy. There is no evidence of CBD or intrahepatic biliary
dilatation.

Pancreas: Pancreatic ductal dilatation is identified within the body
and tail noted with pancreatic atrophy and appears new since 1626.
There is a 3.2 x 2.7 cm cystic structure adjacent adjacent to the
pancreatic head, as well as a 1 x 1.6 cm cystic structure/mass along
the medial uncinate process.

Spleen: Splenomegaly is again identified

Adrenals/Urinary Tract: Mild bilateral renal atrophy noted. A right
renal cyst is present. The adrenal glands are unremarkable. The
bladder is not well-distended and mildly thick-walled.

Stomach/Bowel: Colonic diverticulosis noted without evidence of
diverticulitis. A small hiatal hernia is identified. There is no
evidence of bowel obstruction or definite bowel wall thickening.

Vascular/Lymphatic: Aortic stent graft noted. An IVC filter is
present. No enlarged lymph nodes are identified.

Reproductive: Prostate unremarkable

Other: No free fluid, hematoma, pneumoperitoneum or abscess noted.

Musculoskeletal: No acute or suspicious abnormalities identified.
Degenerative changes within the lumbar spine and hips,
left-greater-than-right noted.
IMPRESSION: New pancreatic ductal dilatation with cystic structures along the
pancreatic head/uncinate process. An obstructing pancreatic mass or
stricture is not excluded and GI consultation is recommended. This
may represent an intraductal papillary mucinous neoplasm.

Cardiomegaly, abdominal aortic stent graft and small hiatal hernia.
